# Patient Record
Sex: Female | Born: 2000 | Hispanic: Yes | Marital: Single | Smoking: Never smoker
Health system: Southern US, Community
[De-identification: ages and names within clinical notes are randomized; demographics above are authoritative.]

## PROBLEM LIST (undated history)

## (undated) ENCOUNTER — Inpatient Hospital Stay (HOSPITAL_COMMUNITY): Payer: Self-pay

## (undated) DIAGNOSIS — L732 Hidradenitis suppurativa: Secondary | ICD-10-CM

## (undated) DIAGNOSIS — R7303 Prediabetes: Secondary | ICD-10-CM

## (undated) DIAGNOSIS — J302 Other seasonal allergic rhinitis: Secondary | ICD-10-CM

## (undated) DIAGNOSIS — G629 Polyneuropathy, unspecified: Secondary | ICD-10-CM

## (undated) DIAGNOSIS — B9681 Helicobacter pylori [H. pylori] as the cause of diseases classified elsewhere: Secondary | ICD-10-CM

## (undated) DIAGNOSIS — K2970 Gastritis, unspecified, without bleeding: Secondary | ICD-10-CM

## (undated) DIAGNOSIS — G4730 Sleep apnea, unspecified: Secondary | ICD-10-CM

## (undated) DIAGNOSIS — K219 Gastro-esophageal reflux disease without esophagitis: Secondary | ICD-10-CM

## (undated) DIAGNOSIS — Z789 Other specified health status: Secondary | ICD-10-CM

## (undated) DIAGNOSIS — L700 Acne vulgaris: Secondary | ICD-10-CM

## (undated) HISTORY — DX: Acne vulgaris: L70.0

## (undated) HISTORY — PX: NO PAST SURGERIES: SHX2092

## (undated) HISTORY — DX: Helicobacter pylori (H. pylori) as the cause of diseases classified elsewhere: B96.81

## (undated) HISTORY — DX: Hidradenitis suppurativa: L73.2

## (undated) HISTORY — DX: Other specified health status: Z78.9

## (undated) HISTORY — DX: Gastro-esophageal reflux disease without esophagitis: K21.9

## (undated) HISTORY — DX: Helicobacter pylori (H. pylori) as the cause of diseases classified elsewhere: K29.70

## (undated) HISTORY — DX: Polyneuropathy, unspecified: G62.9

---
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
## 2019-01-02 ENCOUNTER — Ambulatory Visit (INDEPENDENT_AMBULATORY_CARE_PROVIDER_SITE_OTHER): Payer: Medicaid Other | Admitting: Family Medicine

## 2019-01-02 ENCOUNTER — Other Ambulatory Visit: Payer: Self-pay

## 2019-01-02 VITALS — BP 102/80 | HR 85 | Temp 98.4°F | Wt 290.0 lb

## 2019-01-02 DIAGNOSIS — G5602 Carpal tunnel syndrome, left upper limb: Secondary | ICD-10-CM | POA: Diagnosis not present

## 2019-01-02 DIAGNOSIS — R197 Diarrhea, unspecified: Secondary | ICD-10-CM | POA: Diagnosis not present

## 2019-01-02 MED ORDER — LOPERAMIDE HCL 2 MG PO TABS: 2.0000 mg | ORAL_TABLET | Freq: Four times a day (QID) | ORAL | 0 refills | Status: DC | PRN

## 2019-01-02 NOTE — Progress Notes (Signed)
   Subjective:    Lauren Obrien - 18 y.o. female MRN [id number]  Date of birth: January 03, 2001  CC:  Lauren Obrien is here for continued L arm numbness and diarrhea.  HPI: Continued L arm numbness and pain - lost her arm brace two days ago - wrist brace seemed to help, but feels like her hand feels very weak when she takes it off - pain radiates up to her shoulder -Continues to have numbness and tingling in her left hand  Diarrhea - has loose stools 2-3 times per day for 2 weeks - stools do not appear greasy - drinks oat milk each morning, and she worries that this is causing her diarrhea - has some cramping - does not occur at night - stools are loose but do not contain blood - does not think it is related to stress, although she does say that sometimes she does have GI symptoms in response to stress   Health Maintenance:  There are no preventive care reminders to display for this patient.  -  reports that she has never smoked. She has never used smokeless tobacco. - Review of Systems: Per HPI. - Past Medical History: Patient Active Problem List   Diagnosis Date Noted  . Carpal tunnel syndrome of left wrist 01/04/2019  . Diarrhea 01/04/2019  . Blurry vision, bilateral 12/16/2018  . Learning difficulty 12/16/2018  . Left wrist tendinitis 12/01/2018  . Sleep deprivation 09/28/2018  . Recurrent boils 09/21/2018  . Irregular menstrual bleeding 04/03/2018  . Acne vulgaris 04/03/2018  . Helicobacter pylori gastritis 03/05/2016  . Fatigue 01/21/2016  . GERD (gastroesophageal reflux disease) 01/21/2016  . Allergic rhinitis 09/23/2015   - Medications: reviewed and updated   Objective:   Physical Exam BP 102/80   Pulse 85   Temp 98.4 F (36.9 C) (Oral)   Wt 290 lb (131.5 kg)   SpO2 99%   BMI 48.26 kg/m  Gen: NAD, alert, cooperative with exam, well-appearing, appears comfortable CV: RRR, good S1/S2, no murmur Resp: CTABL, no wheezes, non-labored Abd: SNTND, BS  present, no guarding or organomegaly Musculoskeletal: Phalen's sign positive on left, no deformities visualized on left hand    Assessment & Plan:   Carpal tunnel syndrome of left wrist Although I have warned patient that carpal tunnel takes sometimes over a month to improve, patient to needs to be concerned about her left hand symptoms.  Unfortunately, since she has lost the brace, her symptoms may worsen.  Encouraged patient to buy a new brace, since we do not have another one to give her in our clinic.  Sent referral to sports medicine for further evaluation, possibly including an ultrasound or injections if needed.  Diarrhea Most likely functional given patient's high level of stress currently, although a full work-up has not yet been done.  Would like to start with patient using a food journal to document which foods make her diarrhea worse.  Also encouraged patient to avoid dairy for a few days to see if her diarrhea is affected and to try avoiding gluten for a few days as well.  Patient loperamide to help with her symptoms since they are affecting her while she is taking classes.  If food journal is noncontributory, can try stool ova and parasite exam next.    Lezlie Octave, M.D. 01/04/2019, 9:06 AM PGY-2, [facility]

## 2019-01-02 NOTE — Patient Instructions (Addendum)
It was nice seeing you today Lauren Obrien!  I have included a psychologist who will accept Medicaid below.  Please also take ibuprofen and Tylenol for your arm, and I have sent a referral to sports medicine.  You can also try to find a similar arm brace that is better than the first 1 that you got at the store.  For your diarrhea, I have sent in loperamide, which is also called Imodium.  Please take a journal recording your diet and seeing how it affects your diarrhea.  I hope that you feel better soon.  After you turn 18, you will still be able to get family-planning Medicaid, which will cover birth control including IUDs, so you do not need to get a new one before you turn 18.  If you have any questions or concerns, please feel free to call the clinic.   Be well,  Dr. Frances Furbish   Therapists  [facility]  [address] [place], [place]  [phone]  [facility]  [address] [place] [address] [place], [place]  [phone]  [facility]  [address]  [place], [place] [address] [place], [place] [address]  [phone]  [facility]  [address]  [place], [place] [address] [place], [place] [address]  [phone]

## 2019-01-04 DIAGNOSIS — G5602 Carpal tunnel syndrome, left upper limb: Secondary | ICD-10-CM | POA: Insufficient documentation

## 2019-01-04 DIAGNOSIS — R197 Diarrhea, unspecified: Secondary | ICD-10-CM | POA: Insufficient documentation

## 2019-01-04 NOTE — Assessment & Plan Note (Addendum)
Most likely functional given patient's high level of stress currently, although a full work-up has not yet been done.  Would like to start with patient using a food journal to document which foods make her diarrhea worse.  Also encouraged patient to avoid dairy for a few days to see if her diarrhea is affected and to try avoiding gluten for a few days as well.  Patient loperamide to help with her symptoms since they are affecting her while she is taking classes.  If food journal is noncontributory, can try stool ova and parasite exam next.

## 2019-01-04 NOTE — Assessment & Plan Note (Signed)
Although I have warned patient that carpal tunnel takes sometimes over a month to improve, patient to needs to be concerned about her left hand symptoms.  Unfortunately, since she has lost the brace, her symptoms may worsen.  Encouraged patient to buy a new brace, since we do not have another one to give her in our clinic.  Sent referral to sports medicine for further evaluation, possibly including an ultrasound or injections if needed.

## 2019-01-10 ENCOUNTER — Ambulatory Visit: Payer: Medicaid Other | Admitting: Sports Medicine

## 2019-01-16 DIAGNOSIS — H5213 Myopia, bilateral: Secondary | ICD-10-CM | POA: Diagnosis not present

## 2019-01-17 ENCOUNTER — Ambulatory Visit (INDEPENDENT_AMBULATORY_CARE_PROVIDER_SITE_OTHER): Payer: Medicaid Other | Admitting: Sports Medicine

## 2019-01-17 ENCOUNTER — Encounter: Payer: Self-pay | Admitting: Sports Medicine

## 2019-01-17 VITALS — BP 153/83 | Ht 65.0 in | Wt 290.0 lb

## 2019-01-17 DIAGNOSIS — G5602 Carpal tunnel syndrome, left upper limb: Secondary | ICD-10-CM | POA: Diagnosis not present

## 2019-01-17 MED ORDER — MELOXICAM 15 MG PO TABS: 15.0000 mg | ORAL_TABLET | Freq: Every day | ORAL | 1 refills | Status: DC

## 2019-01-17 NOTE — Progress Notes (Signed)
Lauren Obrien - 18 y.o. female MRN [id number]  Date of birth: 07-06-2001   Chief complaint: L hand numbness and tingling  SUBJECTIVE:    History of present illness: 18 year old female with a 42-month history of progressive numbness and tingling in her left hand.  She states that this was unprovoked.  She woke up after doing a prolonged session of typing and had numbness of her first second and third digits.  It felt like a burning sensation in her hand and fingers.  This initially was intermittent.  She was seen by her primary care with concern for possible carpal tunnel syndrome.  She was given a volar wrist splint and advised to take ibuprofen to help with her symptoms.  She states that ibuprofen did help in the wrist splint helped however she started to feel stiff from the splint so she discontinued it.  She has been out of the splint for approximately 3 weeks now and states her symptoms are worse.  She is starting to have persistent numbness of her first second and third digits.  It feels like a tingling sensation.  It sometimes bothers her at night.  It is worse with typing while she is studying for school.  Nothing really seems to relieve her symptoms.  She is never had anything like this before.  She does endorse that occasionally she feels like her lateral forearm also does have tingling.  Denies any neck pain or elbow pain.  Denies pregnancy.  She is wanting to definitively know today whether or not she has carpal tunnel syndrome.   Review of systems:  A complete review of systems was performed and pertinent positives and negatives discussed above in the HPI.   Past medical history: Allergic rhinitis, gastroesophageal reflux disease, fatigue Past surgical history: None Past family history: Diabetes and hypertension Social history: Non-smoker.  She works a full-time job and is also in school studying  Medications: Flonase, ibuprofen, MiraLAX, Pepcid, Zyrtec Allergies:  None  OBJECTIVE:  Physical exam: Vital signs are reviewed. BP (!) 153/83   Ht 5\' 5"  (1.651 m)   Wt 290 lb (131.5 kg)   BMI 48.26 kg/m   Gen.: Alert, oriented, appears stated age, in no apparent distress, interpreter is also present helping with Spanish to [place] interpretation Respiratory: Normal respirations, able to speak in full sentences Cardiac: Regular rate, distal pulses 2+ Integumentary: No rashes or skin lesions Neurologic:  Patient endorses decreased sensation over the palmar aspect of her first second and third digits at baseline.  Paresthesias worsen with dorsal carpal compression test.  Positive Tinel's test of the left wrist.  Negative Tinel's of the left elbow.  Negative Spurling's test of the neck. Musculoskeletal: Inspection of the left wrist demonstrates no acute abnormality.  She has mild radiocarpal tenderness to palpation without a palpable effusion.  She has hesitation to extension of the wrist however she has full active range of motion in wrist extension and flexion.  Strength testing is 5 out of 5 in wrist flexion and extension.  Her AIN/PIN/ulnar/radial nerves appear to be intact.  Diminished function of the median nerve as above.  Right radial pulses +2.  Capillary refill time less than 2 seconds.  Negative Finkelstein's test.  Positive Phalen's test.    ASSESSMENT & PLAN: Carpal tunnel syndrome of left wrist -Concern for carpal tunnel syndrome clinically versus extensor digitorum tendinitis -EMG study for definitive diagnosis for carpal tunnel syndrome -Dorsal night splint given today.  She previously had worn a splint  for over a month which did seem to help. -Meloxicam 15 mg daily.  Counseled on common and severe side effects of the medication. -Advised to stretch out her transverse carpal ligament as best she can and avoid repetitive activities like prolonged typing.  Adjust keyboard to flatten the wrist while typing. -Follow-up after her EMG studies to discuss  further intervention including ultrasound-guided carpal tunnel injection versus referral for surgical treatment versus physical therapy depending upon the results   Orders Placed This Encounter  Procedures  . Ambulatory referral to Neurology    Referral Priority:   Routine    Referral Type:   Consultation    Referral Reason:   Specialty Services Required    Requested Specialty:   Neurology    Number of Visits Requested:   1    Meds ordered this encounter  Medications  . meloxicam (MOBIC) 15 MG tablet    Sig: Take 1 tablet (15 mg total) by mouth daily.    Dispense:  30 tablet    Refill:  1      Lauren Messing, DO Sports Medicine Fellow   I was the preceptor for this visit and available for immediate consultation Lauren Aris, DO

## 2019-01-17 NOTE — Patient Instructions (Addendum)
Call to schedule a follow-up appointment with [place] after the EMG study are completed.

## 2019-01-17 NOTE — Assessment & Plan Note (Signed)
-  Concern for carpal tunnel syndrome clinically versus extensor digitorum tendinitis -EMG study for definitive diagnosis for carpal tunnel syndrome -Dorsal night splint given today.  She previously had worn a splint for over a month which did seem to help. -Meloxicam 15 mg daily.  Counseled on common and severe side effects of the medication. -Advised to stretch out her transverse carpal ligament as best she can and avoid repetitive activities like prolonged typing.  Adjust keyboard to flatten the wrist while typing. -Follow-up after her EMG studies to discuss further intervention including ultrasound-guided carpal tunnel injection versus referral for surgical treatment versus physical therapy depending upon the results

## 2019-01-22 ENCOUNTER — Encounter: Payer: Self-pay | Admitting: Family Medicine

## 2019-01-22 ENCOUNTER — Ambulatory Visit (INDEPENDENT_AMBULATORY_CARE_PROVIDER_SITE_OTHER): Payer: Medicaid Other | Admitting: Family Medicine

## 2019-01-22 ENCOUNTER — Other Ambulatory Visit: Payer: Self-pay

## 2019-01-22 VITALS — BP 124/80 | HR 97 | Temp 99.3°F | Wt 291.0 lb

## 2019-01-22 DIAGNOSIS — J029 Acute pharyngitis, unspecified: Secondary | ICD-10-CM | POA: Diagnosis not present

## 2019-01-22 DIAGNOSIS — R6889 Other general symptoms and signs: Secondary | ICD-10-CM

## 2019-01-22 LAB — POCT RAPID STREP A (OFFICE): Rapid Strep A Screen: NEGATIVE

## 2019-01-22 LAB — POCT INFLUENZA A/B
INFLUENZA A, POC: NEGATIVE
INFLUENZA B, POC: NEGATIVE

## 2019-01-22 MED ORDER — IBUPROFEN 600 MG PO TABS: 600.0000 mg | ORAL_TABLET | Freq: Three times a day (TID) | ORAL | 0 refills | Status: DC | PRN

## 2019-01-22 NOTE — Progress Notes (Signed)
Date of Visit: 01/22/2019   HPI:  Patient presents to discuss URI symptoms.  Symptoms began yesterday. Having sore throat, cough with phlegm production, body aches, nasal congestion. Eyes feel heavy. Felt warm but no specific fevers. No recent travel to areas with widespread COVID-19 or known contact with a COVID-19 infected person.    ROS: See HPI.  PMFSH: history of acne, allergic rhinitis, GERD, carpal tunnel syndrome  PHYSICAL EXAM: BP 124/80   Pulse 97   Temp 99.3 F (37.4 C) (Oral)   Wt 291 lb (132 kg)   SpO2 98%   BMI 48.42 kg/m  Gen: no acute distress, cooperative, pleasant HEENT: normocephalic, atraumatic, moist mucous membranes. Tympanic membranes bilaterally obscured by cerumen. Nares patent with some clear discharge. Oropharynx clear without exudates, mildly erythematous, mildly enlarged tonsils. No anterior cervical or supraclavicular lymphadenopathy.  Heart: regular rate and rhythm, no murmur Lungs: clear to auscultation bilaterally, normal work of breathing  Neuro: alert, grossly nonfocal, speech normal  ASSESSMENT/PLAN:  Viral syndrome Rapid strep negative, and clinically doubt strep given other viral symptoms (cough, nasal congestion, etc). Rapid flu test is also negative. No known exposures to COVID-19. Will treat with supportive care including fluids, tylenol, ibuprofen and have patient call for worsening symptoms and stay out of work/school until she is feeling better.  FOLLOW UP: Follow up as needed if symptoms worsen or do not improve.   [place] J. Pollie Meyer, MD [facility]

## 2019-01-22 NOTE — Patient Instructions (Signed)
Drink plenty of fluids Tylenol and ibuprofen as needed for pain For sore throat and cough can do teaspoon of honey Stay out of school/work till you feel better.  Be well, Dr. Pollie Meyer     Upper Respiratory Infection, Adult An upper respiratory infection (URI) affects the nose, throat, and upper air passages. URIs are caused by germs (viruses). The most common type of URI is often called "the common cold." Medicines cannot cure URIs, but you can do things at home to relieve your symptoms. URIs usually get better within 7-10 days. Follow these instructions at home: Activity  Rest as needed.  If you have a fever, stay home from work or school until your fever is gone, or until your doctor says you may return to work or school. ? You should stay home until you cannot spread the infection anymore (you are not contagious). ? Your doctor may have you wear a face mask so you have less risk of spreading the infection. Relieving symptoms  Gargle with a salt-water mixture 3-4 times a day or as needed. To make a salt-water mixture, completely dissolve -1 tsp of salt in 1 cup of warm water.  Use a cool-mist humidifier to add moisture to the air. This can help you breathe more easily. Eating and drinking   Drink enough fluid to keep your pee (urine) pale yellow.  Eat soups and other clear broths. General instructions   Take over-the-counter and prescription medicines only as told by your doctor. These include cold medicines, fever reducers, and cough suppressants.  Do not use any products that contain nicotine or tobacco. These include cigarettes and e-cigarettes. If you need help quitting, ask your doctor.  Avoid being where people are smoking (avoid secondhand smoke).  Make sure you get regular shots and get the flu shot every year.  Keep all follow-up visits as told by your doctor. This is important. How to avoid spreading infection to others   Wash your hands often with soap and  water. If you do not have soap and water, use hand sanitizer.  Avoid touching your mouth, face, eyes, or nose.  Cough or sneeze into a tissue or your sleeve or elbow. Do not cough or sneeze into your hand or into the air. Contact a doctor if:  You are getting worse, not better.  You have any of these: ? A fever. ? Chills. ? Brown or red mucus in your nose. ? Yellow or brown fluid (discharge)coming from your nose. ? Pain in your face, especially when you bend forward. ? Swollen neck glands. ? Pain with swallowing. ? White areas in the back of your throat. Get help right away if:  You have shortness of breath that gets worse.  You have very bad or constant: ? Headache. ? Ear pain. ? Pain in your forehead, behind your eyes, and over your cheekbones (sinus pain). ? Chest pain.  You have long-lasting (chronic) lung disease along with any of these: ? Wheezing. ? Long-lasting cough. ? Coughing up blood. ? A change in your usual mucus.  You have a stiff neck.  You have changes in your: ? Vision. ? Hearing. ? Thinking. ? Mood. Summary  An upper respiratory infection (URI) is caused by a germ called a virus. The most common type of URI is often called "the common cold."  URIs usually get better within 7-10 days.  Take over-the-counter and prescription medicines only as told by your doctor. This information is not intended to replace advice  given to you by your health care provider. Make sure you discuss any questions you have with your health care provider. Document Released: 04/19/2008 Document Revised: 06/24/2017 Document Reviewed: 06/24/2017 Elsevier Interactive Patient Education  2019 [employer].

## 2019-01-31 ENCOUNTER — Ambulatory Visit: Payer: Medicaid Other | Admitting: Family Medicine

## 2019-01-31 ENCOUNTER — Telehealth: Payer: Self-pay | Admitting: Family Medicine

## 2019-01-31 MED ORDER — MUPIROCIN 2 % EX OINT: 1.0000 "application " | TOPICAL_OINTMENT | Freq: Two times a day (BID) | CUTANEOUS | 0 refills | Status: DC

## 2019-01-31 NOTE — Telephone Encounter (Signed)
Called patient to assess ability to reschedule appointment. Patient coming to be seen for recurrent boils in genital area.  Previously has been evaluated for this at least twice before.  Has stopped shaving genital area as she thought shaving was irritating the area.  Previous boil popped with blood and pus but now has additional boil.  After shared decision making decided to try conservative measures with warm compresses regular hygiene and Bactroban ointment.  Patient will try this and call back for appointment later if this is not helping.  Bactroban sent to regular pharmacy.  Ellwood Dense, DO PGY-2, [facility] 01/31/2019 2:35 PM

## 2019-01-31 NOTE — Telephone Encounter (Signed)
Pt was returning call from Dr. Linwood Dibbles. I informed pt we had to cancel her appointment and to call back and schedule in a couple weeks.   Pt would like for someone to call her. She said she has a pimple on her vagina which is making it hard to walk and would like to know what she would have to do to be seen.

## 2019-01-31 NOTE — Telephone Encounter (Signed)
Spoke with patient.  See separate telephone encounter.

## 2019-02-13 ENCOUNTER — Telehealth: Payer: Self-pay | Admitting: Family Medicine

## 2019-02-13 ENCOUNTER — Telehealth: Payer: Medicaid Other

## 2019-02-13 ENCOUNTER — Other Ambulatory Visit: Payer: Self-pay

## 2019-02-13 ENCOUNTER — Other Ambulatory Visit: Payer: Self-pay | Admitting: Family Medicine

## 2019-02-13 NOTE — Telephone Encounter (Signed)
Call patient for telemedicine encounter she was scheduled today.  Patient states that she cannot remember why she was scheduled and does not have any concerns at this time.  She does state that she would like a refill on her ibuprofen, but per chart review it appears that Dr. Frances Furbish refilled that today.  Patient declines an encounter today.

## 2019-02-21 ENCOUNTER — Encounter: Payer: Medicaid Other | Admitting: Neurology

## 2019-02-27 ENCOUNTER — Telehealth (INDEPENDENT_AMBULATORY_CARE_PROVIDER_SITE_OTHER): Payer: Medicaid Other | Admitting: Family Medicine

## 2019-02-27 ENCOUNTER — Other Ambulatory Visit: Payer: Self-pay

## 2019-02-27 ENCOUNTER — Telehealth: Payer: Self-pay | Admitting: Family Medicine

## 2019-02-27 DIAGNOSIS — J302 Other seasonal allergic rhinitis: Secondary | ICD-10-CM

## 2019-02-27 MED ORDER — FLUTICASONE PROPIONATE 50 MCG/ACT NA SUSP: 2.0000 | Freq: Every day | NASAL | 6 refills | Status: DC

## 2019-02-27 NOTE — Telephone Encounter (Signed)
Called patient for telehealth. No answer. LVM to call back.

## 2019-02-27 NOTE — Progress Notes (Addendum)
[facility] Telemedicine Visit  Patient consented to have virtual visit. Method of visit: Telephone  Encounter participants: Patient: Lauren Obrien - located at home Provider: Garnette Gunner - located at office Others (if applicable): None  Chief Complaint: Seasonal allergies, recurrent boils  HPI:  ALLERGIES Has had symptom for 14 days. Seasonal symptoms: sneezing, nose itches, HA Nasal discharge: yes Medications tried: none  Symptoms Sneezing: Y Scratchy throat: N Fever: N Headache or face pain: Y Tooth pain: N Sore throat or swollen glands: N Severe fatigue: N Stiff neck: N Shortness of breath: N Rash: N  Patient has history of recurrent boils in inguinal and buttocks regions. She has a long history of these. She is wanting some treatment for these. She is afebrile. I informed her that given that they have been resistant to treatment before, she should come in to be evaluated. Will set up ATC apt.   ROS see HPI Smoking Status noted  ROS: per HPI  Pertinent PMHx: non-contributory  Exam:  Respiratory: cough, sneeze  Assessment/Plan: Problem List Items Addressed This Visit    None    Visit Diagnoses    Seasonal allergies    -  Primary   Relevant Medications   fluticasone (FLONASE) 50 MCG/ACT nasal spray   Allergic state, initial encounter        Will trial intranasal inhaler for seasonal allergies.   Will schedule ATC apt for recurrent inguinal lesions.   Time spent during visit with patient: 15 min minutes

## 2019-03-02 ENCOUNTER — Other Ambulatory Visit: Payer: Self-pay

## 2019-03-02 ENCOUNTER — Ambulatory Visit (INDEPENDENT_AMBULATORY_CARE_PROVIDER_SITE_OTHER): Payer: Medicaid Other | Admitting: Family Medicine

## 2019-03-02 ENCOUNTER — Other Ambulatory Visit (HOSPITAL_COMMUNITY)
Admission: RE | Admit: 2019-03-02 | Discharge: 2019-03-02 | Disposition: A | Discharge status: Home / Self Care | Payer: Medicaid Other | Source: Ambulatory Visit | Attending: Family Medicine | Admitting: Family Medicine

## 2019-03-02 VITALS — BP 104/64 | HR 98 | Temp 98.0°F | Resp 98 | Wt 293.0 lb

## 2019-03-02 DIAGNOSIS — L732 Hidradenitis suppurativa: Secondary | ICD-10-CM | POA: Diagnosis not present

## 2019-03-02 DIAGNOSIS — R300 Dysuria: Secondary | ICD-10-CM

## 2019-03-02 DIAGNOSIS — N898 Other specified noninflammatory disorders of vagina: Secondary | ICD-10-CM | POA: Diagnosis not present

## 2019-03-02 LAB — POCT WET PREP (WET MOUNT)
Clue Cells Wet Prep Whiff POC: NEGATIVE
Trichomonas Wet Prep HPF POC: ABSENT

## 2019-03-02 LAB — POCT URINALYSIS DIP (MANUAL ENTRY)
Bilirubin, UA: NEGATIVE
Blood, UA: NEGATIVE
Glucose, UA: NEGATIVE mg/dL
Ketones, POC UA: NEGATIVE mg/dL
Leukocytes, UA: NEGATIVE
Nitrite, UA: NEGATIVE
Protein Ur, POC: NEGATIVE mg/dL
Spec Grav, UA: 1.025 (ref 1.010–1.025)
Urobilinogen, UA: 0.2 E.U./dL
pH, UA: 6 (ref 5.0–8.0)

## 2019-03-02 MED ORDER — CLINDAMYCIN PHOSPHATE 1 % EX LOTN: TOPICAL_LOTION | Freq: Two times a day (BID) | CUTANEOUS | 0 refills | Status: DC

## 2019-03-02 NOTE — Progress Notes (Signed)
Subjective:  Patient ID: Lauren Obrien  DOB: [birth year] MRN: [id number]  Lauren Obrien is a 18 y.o. female with a PMH of allergies, GERD, AEB, acne, carpal tunnel, here today for vaginal discharge and body pimples.   HPI:  Vaginal Discharge - has been ongoing for few days  - Denies itching, burning, abdominal pain, nausea or vomiting - Discharge described as thin, white.  - Patient reports no STD in the past.  -Patient reports that she is not currently sexually active however she is sexually active with men only. - IUD in place.  - Reports burning with urination, no hematuria.  - Patient reports has never had BV in the past or yeast.  - Patient denies douching.   Body Pimples: -Patient reports that after having her child she developed acne on her face and over her body.  She has not been concerned however over the past few months that she has had worsening pimples that have been painful in her pelvic area.  Patient was told by another outside provider that she is to avoid shaving the area twice in a row in order to prevent these pimples.  Patient states that she tried not shaving but she was still having the occurrence. -States that she has been placing warm compresses over the areas and they will resolve.  She states that they are slightly painful. -Denies any fevers, chills, burning or tingling of the area.  ROS: All other systems otherwise negative, except as mentioned in HPI  Social hx: Denies use of illicit drugs, alcohol use Smoking status reviewed-reports never having smoked  Patient Active Problem List   Diagnosis Date Noted  . Vaginal discharge 03/04/2019  . Hidradenitis suppurativa 03/04/2019  . Carpal tunnel syndrome of left wrist 01/04/2019  . Diarrhea 01/04/2019  . Blurry vision, bilateral 12/16/2018  . Learning difficulty 12/16/2018  . Left wrist tendinitis 12/01/2018  . Sleep deprivation 09/28/2018  . Recurrent boils 09/21/2018  . Irregular menstrual  bleeding 04/03/2018  . Acne vulgaris 04/03/2018  . Helicobacter pylori gastritis 03/05/2016  . Fatigue 01/21/2016  . GERD (gastroesophageal reflux disease) 01/21/2016  . Allergic rhinitis 09/23/2015     Objective:  BP 104/64   Pulse 98   Temp 98 F (36.7 C) (Oral)   Resp (!) 98   Wt 293 lb (132.9 kg)   BMI 48.76 kg/m   Vitals and nursing note reviewed  General: NAD, pleasant Pulm: normal effort GI: soft, nontender, nondistended GU/GYN: External genitalia within normal limits.  Multiple boils noted in pelvic area consistent with hidradenitis supporitiva.  Vaginal mucosa pink, moist, normal rugae.  Nonfriable cervix without lesions, no discharge or bleeding noted on speculum exam.  Bimanual exam revealed normal, nongravid uterus.  No cervical motion tenderness. Exam performed in the presence of a chaperone. Extremities: no edema or cyanosis. WWP. Skin: warm and dry, no rashes noted Neuro: alert and oriented, no focal deficits Psych: normal affect, normal thought content  Assessment & Plan:   Vaginal discharge Patient also reporting some dysuria.  UA normal in office. Wet prep with no signs of infection Gonorrhea and chlamydia pending   Hidradenitis suppurativa Exam consistent with hidradenitis suppurativa.  Patient counseled on weight loss in order to prevent further recurrence.  Advised to use warm compresses.  Patient also given clindamycin cream in order to use twice daily to help prevent.  Areas with no fluctuant and with minimal pain on exam so no need for surgical consult at this time.   [place]  Talbert Forest DO Family Medicine Resident PGY-2

## 2019-03-02 NOTE — Patient Instructions (Addendum)
Thank you for coming to see me today. It was a pleasure! Today we talked about:   Your vaginal discharge.  We will call you with your lab results.  For the bumps on your groin you may use warm compresses as needed.  I have also sent an antibiotic cream that you may use on the individual areas in order to help.  Please follow-up as needed.  If you have any questions or concerns, please do not hesitate to call the office at [phone].  Take Care,   [place] Brindy Higginbotham, DO  Hidradenitis Suppurativa Hidradenitis suppurativa is a long-term (chronic) skin disease. It is similar to a severe form of acne, but it affects areas of the body where acne would be unusual, especially areas of the body where skin rubs against skin and becomes moist. These include:  Underarms.  Groin.  Genital area.  Buttocks.  Upper thighs.  Breasts. Hidradenitis suppurativa may start out as small lumps or pimples caused by blocked sweat glands or hair follicles. Pimples may develop into deep sores that break open (rupture) and drain pus. Over time, affected areas of skin may thicken and become scarred. This condition is rare and does not spread from person to person (non-contagious). What are the causes? The exact cause of this condition is not known. It may be related to:  Female and female hormones.  An overactive disease-fighting system (immune system). The immune system may over-react to blocked hair follicles or sweat glands and cause swelling and pus-filled sores. What increases the risk? You are more likely to develop this condition if you:  Are female.  Are 2911-19 years old.  Have a family history of hidradenitis suppurativa.  Have a personal history of acne.  Are overweight.  Smoke.  Take the medicine lithium. What are the signs or symptoms? The first symptoms are usually painful bumps in the skin, similar to pimples. The condition may get worse over time (progress), or it may only cause  mild symptoms. If the disease progresses, symptoms may include:  Skin bumps getting bigger and growing deeper into the skin.  Bumps rupturing and draining pus.  Itchy, infected skin.  Skin getting thicker and scarred.  Tunnels under the skin (fistulas) where pus drains from a bump.  Pain during daily activities, such as pain during walking if your groin area is affected.  Emotional problems, such as stress or depression. This condition may affect your appearance and your ability or willingness to wear certain clothes or do certain activities. How is this diagnosed? This condition is diagnosed by a health care provider who specializes in skin diseases (dermatologist). You may be diagnosed based on:  Your symptoms and medical history.  A physical exam.  Testing a pus sample for infection.  Blood tests. How is this treated? Your treatment will depend on how severe your symptoms are. The same treatment will not work for everybody with this condition. You may need to try several treatments to find what works best for you. Treatment may include:  Cleaning and bandaging (dressing) your wounds as needed.  Lifestyle changes, such as new skin care routines.  Taking medicines, such as: ? Antibiotics. ? Acne medicines. ? Medicines to reduce the activity of the immune system. ? A diabetes medicine (metformin). ? Birth control pills, for women. ? Steroids to reduce swelling and pain.  Working with a mental health care provider, if you experience emotional distress due to this condition. If you have severe symptoms that do not get  better with medicine, you may need surgery. Surgery may involve:  Using a laser to clear the skin and remove hair follicles.  Opening and draining deep sores.  Removing the areas of skin that are diseased and scarred. Follow these instructions at home: Medicines   Take over-the-counter and prescription medicines only as told by your health care  provider.  If you were prescribed an antibiotic medicine, take it as told by your health care provider. Do not stop taking the antibiotic even if your condition improves. Skin care  If you have open wounds, cover them with a clean dressing as told by your health care provider. Keep wounds clean by washing them gently with soap and water when you bathe.  Do not shave the areas where you get hidradenitis suppurativa.  Do not wear deodorant.  Wear loose-fitting clothes.  Try to avoid getting overheated or sweaty. If you get sweaty or wet, change into clean, dry clothes as soon as you can.  To help relieve pain and itchiness, cover sore areas with a warm, clean washcloth (warm compress) for 5-10 minutes as often as needed.  If told by your health care provider, take a bleach bath twice a week: ? Fill your bathtub halfway with water. ? Pour in  cup of unscented household bleach. ? Soak in the tub for 5-10 minutes. ? Only soak from the neck down. Avoid water on your face and hair. ? Shower to rinse off the bleach from your skin. General instructions  Learn as much as you can about your disease so that you have an active role in your treatment. Work closely with your health care provider to find treatments that work for you.  If you are overweight, work with your health care provider to lose weight as recommended.  Do not use any products that contain nicotine or tobacco, such as cigarettes and e-cigarettes. If you need help quitting, ask your health care provider.  If you struggle with living with this condition, talk with your health care provider or work with a mental health care provider as recommended.  Keep all follow-up visits as told by your health care provider. This is important. Where to find more information  Hidradenitis Suppurativa Foundation, Inc.: https://www.hs-foundation.org/ Contact a health care provider if you have:  A flare-up of hidradenitis suppurativa.  A  fever or chills.  Trouble controlling your symptoms at home.  Trouble doing your daily activities because of your symptoms.  Trouble dealing with emotional problems related to your condition. Summary  Hidradenitis suppurativa is a long-term (chronic) skin disease. It is similar to a severe form of acne, but it affects areas of the body where acne would be unusual.  The first symptoms are usually painful bumps in the skin, similar to pimples. The condition may get worse over time (progress), or it may only cause mild symptoms.  If you have open wounds, cover them with a clean dressing as told by your health care provider. Keep wounds clean by washing them gently with soap and water when you bathe.  Besides skin care, treatment may include medicines, laser treatment, and surgery. This information is not intended to replace advice given to you by your health care provider. Make sure you discuss any questions you have with your health care provider. Document Released: 06/15/2004 Document Revised: 11/09/2017 Document Reviewed: 11/09/2017 Elsevier Interactive Patient Education  2019 [employer].

## 2019-03-04 DIAGNOSIS — N898 Other specified noninflammatory disorders of vagina: Secondary | ICD-10-CM | POA: Insufficient documentation

## 2019-03-04 DIAGNOSIS — L732 Hidradenitis suppurativa: Secondary | ICD-10-CM | POA: Insufficient documentation

## 2019-03-04 NOTE — Assessment & Plan Note (Signed)
Exam consistent with hidradenitis suppurativa.  Patient counseled on weight loss in order to prevent further recurrence.  Advised to use warm compresses.  Patient also given clindamycin cream in order to use twice daily to help prevent.  Areas with no fluctuant and with minimal pain on exam so no need for surgical consult at this time.

## 2019-03-04 NOTE — Assessment & Plan Note (Signed)
Patient also reporting some dysuria.  UA normal in office. Wet prep with no signs of infection Gonorrhea and chlamydia pending

## 2019-03-05 ENCOUNTER — Other Ambulatory Visit: Payer: Self-pay | Admitting: Family Medicine

## 2019-03-05 ENCOUNTER — Telehealth: Payer: Self-pay | Admitting: *Deleted

## 2019-03-05 MED ORDER — CLINDAMYCIN PHOS-BENZOYL PEROX 1-5 % EX GEL: Freq: Two times a day (BID) | CUTANEOUS | 1 refills | Status: DC

## 2019-03-05 NOTE — Telephone Encounter (Signed)
Received fax from [facility] pharmacy requesting prior authorization of CleocinT lotion .  Please see formulary below and let me know if you would like to pursue a PA  Jone Baseman, CMA

## 2019-03-05 NOTE — Telephone Encounter (Signed)
Sent in benzaclin which is preferred

## 2019-03-06 LAB — CERVICOVAGINAL ANCILLARY ONLY
Chlamydia: NEGATIVE
Neisseria Gonorrhea: NEGATIVE

## 2019-03-08 ENCOUNTER — Ambulatory Visit: Payer: Medicaid Other | Admitting: Sports Medicine

## 2019-03-08 ENCOUNTER — Other Ambulatory Visit: Payer: Self-pay

## 2019-03-08 ENCOUNTER — Encounter: Payer: Self-pay | Admitting: Sports Medicine

## 2019-03-08 VITALS — Ht 65.0 in | Wt 293.0 lb

## 2019-03-08 DIAGNOSIS — G5602 Carpal tunnel syndrome, left upper limb: Secondary | ICD-10-CM

## 2019-03-08 NOTE — Progress Notes (Signed)
  Lauren Obrien - 18 y.o. female MRN [id number]  Date of birth: 10-31-2001  Telemedicine Visit   Patient consented to have virtual visit. Pt verified by name and DOB. Method of visit: Video visit   Encounter participants: Patient: Lauren Obrien - located at home Provider: Dalbert Garnet, DO - located at [facility] Supervising physician available for consultation: Dr. Frazier Butt, DO   SUBJECTIVE:      Chief Complaint: numbness/tingling in the bilateral hands  HPI:  18 y/o female with constant numbness/tingling in the hands bilaterally.  She was seen for this approximately 6 months ago and was dx with carpal tunnel syndrome.  At that time she was referred to neurology for EMG/NCS.  She did not have this study performed. Today she reports 0/10 pain but has continued numbness and tingling in the hands bilaterally. Wears braces at night.  She also describes some pain around the shoulder.  Additionally, she reports cracking her wrist/hand and getting relief from this.  She is unable to explain which fingers or portions of her arm feel numb.   ROS:     See HPI. All other reviewed systems negative.  PERTINENT  PMH / PSH FH / / SH:  Past Medical, Surgical, Social, and Family History Reviewed & Updated in the EMR.    OBJECTIVE: There were no vitals taken for this visit.  Physical Exam:  Vital signs are reviewed.  Telemedicine visit  ASSESSMENT & PLAN:  1.  Patient initially scheduled for telemedicine visit for follow-up for carpal tunnel syndrome.  Although she does describe having numbness and tingling, the extent of her symptoms sound to be more involved than carpal tunnel syndrome. -She will be scheduled for follow-up for an in person appointment    I was the preceptor for this visit and available for immediate consultation Marsa Aris, DO

## 2019-03-09 NOTE — Progress Notes (Signed)
Spoke with pt. She stated that she is still having vag. Itching. Made a telemedicine appt for 03/12/2019 at 1:30p. Aquilla Solian, CMA

## 2019-03-12 ENCOUNTER — Other Ambulatory Visit: Payer: Self-pay

## 2019-03-12 ENCOUNTER — Telehealth (INDEPENDENT_AMBULATORY_CARE_PROVIDER_SITE_OTHER): Payer: Medicaid Other | Admitting: Family Medicine

## 2019-03-12 DIAGNOSIS — N760 Acute vaginitis: Secondary | ICD-10-CM

## 2019-03-12 MED ORDER — NYSTATIN-TRIAMCINOLONE 100000-0.1 UNIT/GM-% EX OINT: 1.0000 "application " | TOPICAL_OINTMENT | Freq: Two times a day (BID) | CUTANEOUS | 0 refills | Status: DC

## 2019-03-12 NOTE — Progress Notes (Signed)
  [facility] Telemedicine Visit  Patient consented to have virtual visit. Method of visit: Telephone  Encounter participants: Patient: Lauren Obrien - located at home Provider: Tillman Sers - located at [facility] office Others (if applicable): none  Chief Complaint: vaginal itching  HPI:  Patient was seen in office 4/17 for vaginal complaint, she had normal UA and normal wet prep at that time. She did not hear back about the other test (gc/chlamydia) and I informed her it was negative. She states that since that visit she has had more and more vaginal itching which is very uncomfortable for her. She states there is some redness around her vagina especially on the labia. She has not seen a rash or any bumps or lesions. No vaginal discharge. No pain with urination. No fevers.   ROS: per HPI  Pertinent PMHx: hx of yeast vaginitis noted 4 years ago  Exam:  Respiratory: speaks in full sentences, no respiratory distress  Assessment/Plan:  1. Vaginitis and vulvovaginitis Although wet prep 4/17 negative for yeast, patient experiencing vaginal itching and pain consistent with vaginitis, likely from yeast, will treat w/ 7 days of triamcinolone-nystatin cream for relief of itching/pain. Discussed with patient limitations of diagnosis given this is a telephone call, she agrees to call if the treatment of cream BID does not resolve symptoms in 7 days. At that time if she still has irritation would have her come in again for in person evaluation.   Time spent during visit with patient: 7 minutes  Dolores Patty, DO PGY-3, [facility] 03/12/2019 1:54 PM

## 2019-03-13 ENCOUNTER — Ambulatory Visit: Payer: Medicaid Other | Admitting: Sports Medicine

## 2019-03-15 ENCOUNTER — Ambulatory Visit: Payer: Medicaid Other | Admitting: Sports Medicine

## 2019-03-15 ENCOUNTER — Telehealth: Payer: Self-pay | Admitting: *Deleted

## 2019-03-15 MED ORDER — CLOTRIMAZOLE-BETAMETHASONE 1-0.05 % EX CREA: 1.0000 "application " | TOPICAL_CREAM | Freq: Two times a day (BID) | CUTANEOUS | 0 refills | Status: DC

## 2019-03-15 NOTE — Telephone Encounter (Signed)
Received fax from pharmacy requesting prior authorization of nystatin-triamcinolone ointment.  See formulary below.  Let me know if you want to pursue a PA. Jone Baseman, CMA

## 2019-03-15 NOTE — Telephone Encounter (Signed)
rx for lotrisone sent in to pharmacy in place of [facility]

## 2019-03-22 ENCOUNTER — Ambulatory Visit: Payer: Medicaid Other | Admitting: Sports Medicine

## 2019-03-26 ENCOUNTER — Ambulatory Visit: Payer: Medicaid Other | Admitting: Sports Medicine

## 2019-04-13 ENCOUNTER — Encounter: Payer: Self-pay | Admitting: Family Medicine

## 2019-04-13 ENCOUNTER — Telehealth (INDEPENDENT_AMBULATORY_CARE_PROVIDER_SITE_OTHER): Payer: Medicaid Other | Admitting: Student in an Organized Health Care Education/Training Program

## 2019-04-13 ENCOUNTER — Ambulatory Visit (INDEPENDENT_AMBULATORY_CARE_PROVIDER_SITE_OTHER): Payer: Medicaid Other | Admitting: Family Medicine

## 2019-04-13 ENCOUNTER — Other Ambulatory Visit: Payer: Self-pay

## 2019-04-13 DIAGNOSIS — L732 Hidradenitis suppurativa: Secondary | ICD-10-CM

## 2019-04-13 MED ORDER — DOXYCYCLINE HYCLATE 100 MG PO CAPS: 100.0000 mg | ORAL_CAPSULE | Freq: Every day | ORAL | 0 refills | Status: AC

## 2019-04-13 NOTE — Progress Notes (Signed)
[facility] Telemedicine Visit  Patient consented to have virtual visit. Method of visit: Video was attempted, but technology challenges prevented patient from using video, so visit was conducted via telephone.  Encounter participants: Patient: Lauren Obrien TAVWPV - located at home Provider: Leeroy Bock - located at home office  Chief Complaint: worsening vaginal "pimples" and itching  HPI:  Patient states that she has a frequent history of "pimples" on her vagina which was previously improved with the topical antibiotic given by her doctor. She continues to use the cream but her lesions have gotten worse now. She describes them as larger, more in number, and more painful. She has also had 2-3 days of worsening pruritus in her vagina which has prevented her from sleeping. She has not tried anything to treat at home. She denies dysuria or discharge.  ROS: per HPI  Pertinent PMHx: patient seen 4/17 for similar presentation and had negative UA, wet prep and GC/chlamydia. Exam positive for HA.   Exam:  Patient sounds mildly anxious.  Assessment/Plan:  Hidradenitis suppurativa - worsening of status despite topical antibiotic tx - referred patient for in-person appointment for exam. - based off exam, would consider systemic antibiotic course or surgical consult     Time spent during visit with patient: 7 minutes

## 2019-04-13 NOTE — Patient Instructions (Addendum)
Thank you for coming to see me today. It was a pleasure! Today we talked about:   Lets try 12 weeks of doxycycline for your hidradenitis suppurativa. I'm glad you have an IUD because you should not get pregnant on this medicine.  You may try Darene Lamer for your hair.   Please follow-up with your primary care provider in 12 weeks or sooner as needed.  If you have any questions or concerns, please do not hesitate to call the office at [phone].  Take Care,   [place] Remonia Otte, DO  Doxycycline oral tablets (Periodontitis) What is this medicine? DOXYCYCLINE (dox i SYE kleen) is a tetracycline antibiotic. It kills certain bacteria or stops their growth. This medicine is used to treat a dental infection called periodontitis. This medicine may be used for other purposes; ask your health care provider or pharmacist if you have questions. COMMON BRAND NAME(S): Oraxyl, Periostat What should I tell my health care provider before I take this medicine? They need to know if you have any of these conditions: -liver disease -long exposure to sunlight like working outdoors -stomach problems like colitis -an unusual or allergic reaction to doxycycline, tetracycline antibiotics, other medicines, foods, dyes, or preservatives -pregnant or trying to get pregnant -breast-feeding How should I use this medicine? Take this medicine by mouth with a full glass of water. Follow the directions on the prescription label. Take this medicine at least 1 hour before or 2 hours after food. Take your medicine at regular intervals. Do not take your medicine more often than directed. Take all of your medicine as directed even if you think you are better. Do not skip doses or stop your medicine early. Talk to your pediatrician regarding the use of this medicine in children. Special care may be needed. Overdosage: If you think you have taken too much of this medicine contact a poison control center or emergency room at  once. NOTE: This medicine is only for you. Do not share this medicine with others. What if I miss a dose? If you miss a dose, take it as soon as you can. If it is almost time for your next dose, take only that dose. Do not take double or extra doses. What may interact with this medicine? -antacids -barbiturates -birth control pills -bismuth subsalicylate -carbamazepine -methoxyflurane -other antibiotics -phenytoin -vitamins that contain iron -warfarin This list may not describe all possible interactions. Give your health care provider a list of all the medicines, herbs, non-prescription drugs, or dietary supplements you use. Also tell them if you smoke, drink alcohol, or use illegal drugs. Some items may interact with your medicine. What should I watch for while using this medicine? Tell your doctor or health care professional if your symptoms do not improve. Do not treat diarrhea with over the counter products. Contact your doctor if you have diarrhea that lasts more than 2 days or if it is severe and watery. Do not take this medicine just before going to bed. It may not dissolve properly when you lay down and can cause pain in your throat. Drink plenty of fluids while taking this medicine to also help reduce irritation in your throat. This medicine can make you more sensitive to the sun. Keep out of the sun. If you cannot avoid being in the sun, wear protective clothing and use sunscreen. Do not use sun lamps or tanning beds/booths. Birth control pills may not work properly while you are taking this medicine. Talk to your doctor about using an  extra method of birth control. If you are being treated for a sexually transmitted infection, avoid sexual contact until you have finished your treatment. Your sexual partner may also need treatment. Avoid antacids, aluminum, calcium, magnesium, and iron products for 4 hours before and 2 hours after taking a dose of this medicine. What side effects  may I notice from receiving this medicine? Side effects that you should report to your doctor or health care professional as soon as possible: -allergic reactions like skin rash, itching or hives, swelling of the face, lips, or tongue -difficulty breathing -fever -itching in the rectal or genital area -pain on swallowing -redness, blistering, peeling or loosening of the skin, including inside the mouth -severe stomach pain or cramps -unusual bleeding or bruising -unusually weak or tired -yellowing of the eyes or skin Side effects that usually do not require medical attention (report to your doctor or health care professional if they continue or are bothersome): -diarrhea -loss of appetite -nausea, vomiting This list may not describe all possible side effects. Call your doctor for medical advice about side effects. You may report side effects to FDA at 1-800-FDA-1088. Where should I keep my medicine? Keep out of the reach of children. Store at room temperature between 15 and 30 degrees C (59 and 86 degrees F). Protect from light. Keep container tightly closed. Throw away any unused medicine after the expiration date. Taking this medicine after the expiration date can make you seriously ill. NOTE: This sheet is a summary. It may not cover all possible information. If you have questions about this medicine, talk to your doctor, pharmacist, or health care provider.  2019 Elsevier/Gold Standard (2008-02-29 14:50:34)

## 2019-04-13 NOTE — Assessment & Plan Note (Addendum)
-   worsening of status despite topical antibiotic tx - referred patient for in-person appointment for exam. - based off exam, would consider systemic antibiotic course or surgical consult

## 2019-04-13 NOTE — Progress Notes (Signed)
  Subjective:  Patient ID: Lauren Obrien  DOB: December 11, 2000 MRN: [id number]  Eliotte Middendorf is a 18 y.o. female with a PMH of allergic rhinitis, GERD, acne, hidradenitis suppurative, here today for boils/pimples.   HPI:  Hidradenitis Suppurative: - Patient reports that the cream has helped her with her pimples in her groin. She states that they however have not gone away completely. She is also distraught with the fact that's he has pimples all over her body.  -She is interested in some other medication or stringer cream for her groin -denies any drainage from areas, no fevers, chills, abdominal pain, vaginal discharge  ROS: as mentioned in HPI  Social hx: Denies use of illicit drugs, alcohol use Smoking status reviewed-denies smoking  Patient Active Problem List   Diagnosis Date Noted  . Hidradenitis suppurativa 03/04/2019  . Carpal tunnel syndrome of left wrist 01/04/2019  . Blurry vision, bilateral 12/16/2018  . Learning difficulty 12/16/2018  . Left wrist tendinitis 12/01/2018  . Sleep deprivation 09/28/2018  . Recurrent boils 09/21/2018  . Irregular menstrual bleeding 04/03/2018  . Acne vulgaris 04/03/2018  . Helicobacter pylori gastritis 03/05/2016  . GERD (gastroesophageal reflux disease) 01/21/2016  . Allergic rhinitis 09/23/2015     Objective:  BP 105/80 (BP Location: Left Arm, Patient Position: Sitting, Cuff Size: Large)   Pulse 82   SpO2 99%   Vitals and nursing note reviewed  General: NAD, pleasant Pulm: normal effort GI: soft, nontender, nondistended Extremities: no edema or cyanosis. WWP. Skin: warm and dry, small papules and pustules through groin area with no fluctuance or surrounding erythema  Neuro: alert and oriented, no focal deficits Psych: normal affect, normal thought content  Assessment & Plan:   Hidradenitis suppurativa Exam consistent with mild hidradenitis suppurativa. Discussed options for treatment ad patient is aggravated by these  areas ad wishes to seek further treatment. Areas with no fluctuant and with minimal pain on exam so no need for surgical consult at this time, and would likely not resolve issue -will started patient on 12 weeks of doxycycline therapy which may also help with her acne- she has IUD in place and states she is not currently sexually active -patient with no signs of hirsutism, but may consider low-dose spironolactone -Patient again counseled on weight loss in order to prevent further recurrence. -Advised to use warm compresses.   -return precautions discussed    [place] Graylyn Bunney, DO Family Medicine Resident PGY-2

## 2019-04-16 ENCOUNTER — Other Ambulatory Visit: Payer: Self-pay

## 2019-04-16 ENCOUNTER — Ambulatory Visit (INDEPENDENT_AMBULATORY_CARE_PROVIDER_SITE_OTHER): Payer: Medicaid Other | Admitting: Sports Medicine

## 2019-04-16 ENCOUNTER — Encounter: Payer: Self-pay | Admitting: Sports Medicine

## 2019-04-16 VITALS — BP 143/79 | Ht 66.0 in | Wt 290.0 lb

## 2019-04-16 DIAGNOSIS — G5693 Unspecified mononeuropathy of bilateral upper limbs: Secondary | ICD-10-CM | POA: Diagnosis not present

## 2019-04-16 NOTE — Assessment & Plan Note (Addendum)
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  Notably, Phalen and Tinel signs were negative today.  An EMG may not be currently indicated since the diagnosis of carpal tunnel is unclear.  Will refer to neurology for further evaluation given her persistent neuropathic symptoms.

## 2019-04-16 NOTE — Progress Notes (Addendum)
   Subjective:    Lauren Obrien - 18 y.o. female MRN [id number]  Date of birth: February 09, 2001  CC:  Lauren Obrien is here for bilateral upper extremity numbness.  HPI: Patient reports that she has had left upper extremity numbness and tingling for a few months.  She has worn a wrist brace every night for the past couple months without any improvement of her symptoms, although they have not gotten worse either.  She was taking college classes until early May that involved a lot of typing, but even once those ended, her numbness and tingling persisted.  She will sometimes feel numbness and tingling radiate up to her left shoulder as well.  Then, about 1 week ago, she developed some pain in her right wrist as well as numbness and tingling of that hand.  She had initially been scheduled to do an EMG but canceled that appointment when she was feeling better.  However, since then, her symptoms have returned.  Health Maintenance:  There are no preventive care reminders to display for this patient.  -  reports that she has never smoked. She has never used smokeless tobacco. - Review of Systems: Per HPI. - Past Medical History: Patient Active Problem List   Diagnosis Date Noted  . 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 Hidradenitis suppurativa 03/04/2019  . Carpal tunnel syndrome of left wrist 01/04/2019  . Blurry vision, bilateral 12/16/2018  . Learning difficulty 12/16/2018  . Left wrist tendinitis 12/01/2018  . Sleep deprivation 09/28/2018  . Recurrent boils 09/21/2018  . Irregular menstrual bleeding 04/03/2018  . Acne vulgaris 04/03/2018  . Helicobacter pylori gastritis 03/05/2016  . GERD (gastroesophageal reflux disease) 01/21/2016  . Allergic rhinitis 09/23/2015   - Medications: reviewed and updated   Objective:   Physical Exam BP (!) 143/79   Ht 5\' 6"  (1.676 m)   Wt 290 lb (131.5 kg)   BMI 46.81 kg/m  Gen: NAD, alert, cooperative with exam, well-appearing,  obese, pleasant Musculoskeletal: No visual deformity of her bilateral upper extremities.  Bilateral wrists are nontender to palpation.  Normal range of motion of bilateral wrists. Neurologic: Phalen and Tinel signs are negative, although Phalen sign produces pain of her left wrist.  Slightly reduced grip strength bilaterally, although wrist flexion and extension strength is 5/5 bilaterally.  Biceps strength 5/5 bilaterally.           Assessment & Plan:   Neuropathy of both upper extremities Although her symptoms initially raised concern for carpal tunnel syndrome, since her left upper extremity symptoms have not improved with nightly bracing and she has now developed symptoms on the right, she likely has neuropathy of some other source.  Notably, Phalen and Tinel signs were negative today.  An EMG may not be currently indicated since the diagnosis of carpal tunnel is unclear.  Will refer to neurology for further evaluation given her persistent neuropathic symptoms.    Lezlie Octave, M.D. 04/16/2019, 4:33 PM PGY-2, [facility]   Patient seen and evaluated with the resident.  I agree with the above plan of care.  Patient symptoms do not really fit carpal tunnel syndrome at this time.  I think she would benefit from referral to neurology.  I will defer further work-up and treatment to the discretion of neurology.  Patient will follow-up with [place] as needed.

## 2019-04-16 NOTE — Assessment & Plan Note (Addendum)
Exam consistent with mild hidradenitis suppurativa. Discussed options for treatment ad patient is aggravated by these areas ad wishes to seek further treatment. Areas with no fluctuant and with minimal pain on exam so no need for surgical consult at this time, and would likely not resolve issue -will started patient on 12 weeks of doxycycline therapy which may also help with her acne- she has IUD in place and states she is not currently sexually active -patient with no signs of hirsutism, but may consider low-dose spironolactone -Patient again counseled on weight loss in order to prevent further recurrence. -Advised to use warm compresses.   -return precautions discussed

## 2019-04-16 NOTE — Patient Instructions (Signed)
[facility] will be giving you a call to set up your first appointment. If you do not hear from them by Thursday, give THEM a call.

## 2019-04-20 ENCOUNTER — Telehealth (INDEPENDENT_AMBULATORY_CARE_PROVIDER_SITE_OTHER): Payer: Medicaid Other | Admitting: Family Medicine

## 2019-04-20 ENCOUNTER — Other Ambulatory Visit: Payer: Self-pay

## 2019-04-20 DIAGNOSIS — N898 Other specified noninflammatory disorders of vagina: Secondary | ICD-10-CM | POA: Diagnosis not present

## 2019-04-20 MED ORDER — KETOCONAZOLE 2 % EX CREA: 1.0000 "application " | TOPICAL_CREAM | Freq: Every day | CUTANEOUS | 0 refills | Status: DC

## 2019-04-20 NOTE — Progress Notes (Signed)
[facility] Telemedicine Visit  Patient consented to have virtual visit. Method of visit: Telephone  Encounter participants: Patient: Lauren Obrien - located at home Provider: [place] Shayleen Eppinger - located at [facility] Others (if applicable): n/a  Chief Complaint:  External Vaginal itching  HPI:  Calling because she is having itching on the outside of her vagina. No new rashes, maybe some redness. She says she has been scratching some at night. She has not trimmed her hair as discussed at our last visit, but has since stopped the clindamycin cream. Denies any vaginal discharge, recent intercourse, pain, discharge or swelling. No fevers, chills. Has not been sexually active. The itching is just bothersome, not interferring with everyday life.   ROS: per HPI  Pertinent PMHx: hidradenitis suppurativa   Exam:  Respiratory: normal work of breathing, speaking in full sentences  Assessment/Plan:  Itching in the vaginal area Patient seen last week by me with no signs of rash, or infection. Patient not sexually active. No concerning findings. Long hair on exam and patient counseled to trim, but has not. She has discontinued her abx cream. Reports some red rash possibly, will trial antifungal cream as patient cannot be examined with video and this is less concerning for office visit.     Time spent during visit with patient: 11 minutes

## 2019-04-23 ENCOUNTER — Other Ambulatory Visit: Payer: Self-pay | Admitting: Family Medicine

## 2019-04-23 DIAGNOSIS — N898 Other specified noninflammatory disorders of vagina: Secondary | ICD-10-CM | POA: Insufficient documentation

## 2019-04-23 NOTE — Assessment & Plan Note (Signed)
Patient seen last week by me with no signs of rash, or infection. Patient not sexually active. No concerning findings. Long hair on exam and patient counseled to trim, but has not. She has discontinued her abx cream. Reports some red rash possibly, will trial antifungal cream as patient cannot be examined with video and this is less concerning for office visit.

## 2019-04-24 NOTE — Telephone Encounter (Signed)
Pt is requesting a refill on Ketoconazole be called in. She has used all the cream and needs more. jw

## 2019-05-02 ENCOUNTER — Telehealth: Payer: Self-pay

## 2019-05-02 NOTE — Telephone Encounter (Signed)
Lvm via telephone spanish interpreter service reminding pt of her NCS/EMG scheduled for 05/07/19. Appt time provided.

## 2019-05-07 ENCOUNTER — Encounter: Payer: Medicaid Other | Admitting: Neurology

## 2019-05-07 ENCOUNTER — Ambulatory Visit (INDEPENDENT_AMBULATORY_CARE_PROVIDER_SITE_OTHER): Payer: Medicaid Other | Admitting: Student in an Organized Health Care Education/Training Program

## 2019-05-07 ENCOUNTER — Other Ambulatory Visit: Payer: Self-pay

## 2019-05-07 DIAGNOSIS — H6123 Impacted cerumen, bilateral: Secondary | ICD-10-CM | POA: Diagnosis not present

## 2019-05-07 NOTE — Patient Instructions (Signed)
It was a pleasure seeing you today in our clinic.   Our clinic's number is [phone]. Please call with questions or concerns about what we discussed today.  Be well, Dr. Mosetta PuttFeng   What is ear wax impaction? Ear wax impaction is when ear wax builds up enough to cause symptoms. Normally, ear wax helps to protect the insides of the ears and prevents injury or infection (figure 1). But having too much ear wax can cause symptoms such as pain and trouble hearing. The medical term for ear wax is "cerumen."  Young children and older adults are more likely than others to have ear wax impaction.  What causes ear wax impaction? Several different things can cause ear wax impaction:  ?Diseases that affect the ear - Some health problems can affect the shape of the inside of the ear, and make it hard for wax to move out. For example, skin problems that cause skin cells to shed a lot can lead to wax build-up in the ears.  ?A narrow ear canal (figure 2) - In some people, the ear canals are narrower than in others. These people might be more likely to have ear wax impaction. A person's ear canal can become narrower after an ear injury or after severe or multiple ear infections.  ?Changes in ear wax and lining due to aging - As people get older, their ear wax gets harder and thicker. This makes it difficult for the wax to move out of the ear as it should.  ?Bad ear-cleaning habits - Some people try to clean their ears using cotton swabs (Q-Tips) or other tools. This can actually push the wax deeper into the ear instead of getting it out. Over time, this can cause ear wax impaction.  ?Making too much ear wax - Some people make more ear wax than others. This can happen when water gets trapped in the ear, or when the ear is injured. But some people have a lot of ear wax for no obvious reason.  What are the symptoms of ear wax impaction? The symptoms include:  ?Trouble hearing  ?Pain in the ear  ?Hearing a  ringing noise in the ear  ?Feeling like the ear is blocked or plugged  These symptoms can happen in one or both ears.  Should I see a doctor or nurse? Yes. If you or your child has any of these symptoms, see a doctor or nurse. They can check the insides of the ears to figure out if the symptoms are caused by ear wax impaction or another problem, such as an ear infection.  Should I clean my (or my child's) ears at home? No. The insides of the ears do not usually need to be cleaned. Sticking anything into the ears can push the wax in deeper and cause impaction.  "Ear candling" involves lighting one end of a hollow candle, and putting the other end in the ear. Ear candling is not recommended for ear wax removal. It does not remove ear wax and can even cause ear injuries and burns.  How is ear wax impaction treated? There are several treatments to remove impacted ear wax. Doctors and nurses offer these treatments only to people who have bothersome symptoms. They do not recommend treatments for removing ear wax in people who have no symptoms, even if their ears are impacted.  In some cases, doctors and nurses will remove ear wax in people whose ears are impacted and who aren't able to let others know  if they have symptoms or not. This can include young children, and people who are confused or have trouble speaking, including some older adults.  There are several different ways to remove ear wax:  ?Ear drops - Special ear drops can soften ear wax and help it to drain out. Ear drops are not usually safe for people with an ear infection or damage to the eardrum.  ?Rinsing - In some cases, a doctor or nurse can remove impacted ear wax by squirting water (or a special liquid) into the ear to rinse it out.  ?Special tools - A doctor or nurse might use a special tool to remove ear wax. There are different types of tools that can do this safely. These include small sticks, hooks, and spoons. There are  also tools that use suction to pull the wax out.

## 2019-05-07 NOTE — Progress Notes (Signed)
   CC: bilateral cerumen impaction  HPI: Lauren Obrien is a 18 y.o. female with PMH significant for learning difficulty, acne, GERD who presents to [facility] today with cerumen impaction of two weeks duration.   Patient was in her usual state of health until two weeks ago when she developed the sensation of hearing her pulse in her left ear. She also feels she has decreased hearing bilaterally. She denies ear pain or drainage. No rhinorrhea, congestion, cough or sore throat. She has not tried to clean her ears at home because she has been told previously not to.  Review of Symptoms:  See HPI for ROS.   CC, SH/smoking status, and VS noted.  Objective: BP 112/68   Pulse 98   SpO2 98%  GEN: NAD, alert, cooperative, and pleasant. EYE: no conjunctival injection, pupils equally round and reactive to light ENMT: +cerumen impaction bilaterally improves with removal and normal tympanic light reflex bilaterally on second exam, no rhinorrhea, no pharyngeal erythema or exudates NECK: full ROM RESPIRATORY: clear to auscultation bilaterally with no wheezes, rhonchi or rales, good effort CV: RRR, no m/r/g GI: non-distended SKIN: warm and dry, no rashes or lesions NEURO: II-XII grossly intact PSYCH: AAOx3, appropriate affect  Assessment and plan:  Cerumen impaction Patient underwent cleaning of bilateral ears in the office today. A significant amount of cerumen was removed from the ears. She had improvement in symptoms afterwards. She continues to have some cerumen bilaterally and continues to hear her pulse in her left ear. - debrox daily x1 week for cerumen softening - present back to nurse clinic in one week for cerumen removal  Everrett Coombe, MD,MS,  PGY3 05/08/2019 8:40 AM

## 2019-05-08 DIAGNOSIS — H6120 Impacted cerumen, unspecified ear: Secondary | ICD-10-CM | POA: Insufficient documentation

## 2019-05-08 NOTE — Assessment & Plan Note (Signed)
Patient underwent cleaning of bilateral ears in the office today. A significant amount of cerumen was removed from the ears. She had improvement in symptoms afterwards. She continues to have some cerumen bilaterally and continues to hear her pulse in her left ear. - debrox daily x1 week for cerumen softening - present back to nurse clinic in one week for cerumen removal

## 2019-05-14 ENCOUNTER — Ambulatory Visit (INDEPENDENT_AMBULATORY_CARE_PROVIDER_SITE_OTHER): Payer: Medicaid Other | Admitting: Family Medicine

## 2019-05-14 ENCOUNTER — Other Ambulatory Visit: Payer: Self-pay

## 2019-05-14 ENCOUNTER — Encounter: Payer: Self-pay | Admitting: Family Medicine

## 2019-05-14 VITALS — BP 117/70 | HR 90

## 2019-05-14 DIAGNOSIS — M722 Plantar fascial fibromatosis: Secondary | ICD-10-CM | POA: Insufficient documentation

## 2019-05-14 DIAGNOSIS — H6123 Impacted cerumen, bilateral: Secondary | ICD-10-CM

## 2019-05-14 NOTE — Assessment & Plan Note (Signed)
Likely source of bilateral foot pain.  Exacerbated by weight and poor footwear.  No red flags.  Does have Pes cavus on exam. - Emphasized importance of good footwear with high arch - Discussed conservative management and given handout

## 2019-05-14 NOTE — Progress Notes (Signed)
   Subjective   Patient ID: Lauren Obrien    DOB: 11/24/2000, 18 y.o. female   MRN: [id number]  CC: "Ear impaction"  HPI: Lauren Obrien is a 19 y.o. female who presents to clinic today for the following:  Bilateral cerumen impaction: Ajani was seen last week for cerumen impaction for a 2-week duration.  She also reported a pulsation of her left ear which she describes as her heartbeat.  She continues to be without ear pain or drainage he denies URI symptoms including cough, congestion, rhinorrhea, or sore throat.  She does not clean her ears as previously told.  There was a considerable amount of cerumen removed from her ears during her last visit with some improvement of symptoms afterwards however she was instructed to use Debrox for 1 week in order to soften her cerumen.  She is here today to have this removed.  Bilateral foot pain: Ongoing issue over the last few weeks.  Patient states it is a sharp stinging sensation being at the heels and radiates across her arch bilaterally.  Pain is typically worse when she first gets up or with prolonged standing at [facility] or when wearing heels.  She has no loss of feeling or weakness in her lower extremities.  ROS: see HPI for pertinent.  [place]: Reviewed. Smoking status reviewed. Medications reviewed.  Objective   BP 117/70   Pulse 90   SpO2 98%  Vitals and nursing note reviewed.  General: obese, NAD with non-toxic appearance HEENT: normocephalic, atraumatic, moist mucous membranes, partially obstructed left ear by cerumen with patent ear canal on and gray TMs following irrigation without discharge or tenderness to palpation of ear or mastoid process Neck: supple, non-tender without lymphadenopathy Cardiovascular: regular rate and rhythm without murmurs, rubs, or gallops Lungs: clear to auscultation bilaterally with normal work of breathing Skin: warm, dry, no rashes or lesions Extremities: warm and well perfused, normal tone, no  edema, non-tender, neurovascular intact, high arch of plantar surface  Assessment & Plan   Cerumen impaction Improved with irrigation.  Likely source of pulsatile sensation in left ear with improvement following irrigation. - Advised to follow-up if pulsatile sensation continues - Discuss good ear hygiene  Plantar fasciitis Likely source of bilateral foot pain.  Exacerbated by weight and poor footwear.  No red flags.  Does have Pes cavus on exam. - Emphasized importance of good footwear with high arch - Discussed conservative management and given handout  No orders of the defined types were placed in this encounter.  No orders of the defined types were placed in this encounter.   Lauren Obrien, [place], PGY-3 05/14/2019, 4:09 PM

## 2019-05-14 NOTE — Assessment & Plan Note (Signed)
Improved with irrigation.  Likely source of pulsatile sensation in left ear with improvement following irrigation. - Advised to follow-up if pulsatile sensation continues - Discuss good ear hygiene

## 2019-05-14 NOTE — Patient Instructions (Signed)
Thank you for coming in to see [place] today. Please see below to review our plan for today's visit.  I would look into finding footwear with a higher arch to prevent your plantar fasciitis.  You can freeze a water bottle and roll it on your feet when sitting or watching TV.  This will alleviate some of the fascial pain you are experiencing.  This is a common condition and will usually resolve on its own but may take weeks to months to completely go away.  This can also flareup at times which is why emphasize getting good footwear.  If you continue having a pulsatile sensation in your left ear after a few weeks following the irrigation of your earwax, please follow-up here at the clinic.  Please call the clinic at [phone] if your symptoms worsen or you have any concerns. It was our pleasure to serve you.  Harriet [place], [place], PGY-3     Plantar Fasciitis  Plantar fasciitis is a painful foot condition that affects the heel. It occurs when the band of tissue that connects the toes to the heel bone (plantar fascia) becomes irritated. This can happen as the result of exercising too much or doing other repetitive activities (overuse injury). The pain from plantar fasciitis can range from mild irritation to severe pain that makes it difficult to walk or move. The pain is usually worse in the morning after sleeping, or after sitting or lying down for a while. Pain may also be worse after long periods of walking or standing. What are the causes? This condition may be caused by:  Standing for long periods of time.  Wearing shoes that do not have good arch support.  Doing activities that put stress on joints (high-impact activities), including running, aerobics, and ballet.  Being overweight.  An abnormal way of walking (gait).  Tight muscles in the back of your lower leg (calf).  High arches in your feet.  Starting a new athletic activity. What are the signs or  symptoms? The main symptom of this condition is heel pain. Pain may:  Be worse with first steps after a time of rest, especially in the morning after sleeping or after you have been sitting or lying down for a while.  Be worse after long periods of standing still.  Decrease after 30-45 minutes of activity, such as gentle walking. How is this diagnosed? This condition may be diagnosed based on your medical history and your symptoms. Your health care provider may ask questions about your activity level. Your health care provider will do a physical exam to check for:  A tender area on the bottom of your foot.  A high arch in your foot.  Pain when you move your foot.  Difficulty moving your foot. You may have imaging tests to confirm the diagnosis, such as:  X-rays.  Ultrasound.  MRI. How is this treated? Treatment for plantar fasciitis depends on how severe your condition is. Treatment may include:  Rest, ice, applying pressure (compression), and raising the affected foot (elevation). This may be called RICE therapy. Your health care provider may recommend RICE therapy along with over-the-counter pain medicines to manage your pain.  Exercises to stretch your calves and your plantar fascia.  A splint that holds your foot in a stretched, upward position while you sleep (night splint).  Physical therapy to relieve symptoms and prevent problems in the future.  Injections of steroid medicine (cortisone) to relieve pain and inflammation.  Stimulating your plantar fascia with electrical impulses (extracorporeal shock wave therapy). This is usually the last treatment option before surgery.  Surgery, if other treatments have not worked after 12 months. Follow these instructions at home:  Managing pain, stiffness, and swelling  If directed, put ice on the painful area: ? Put ice in a plastic bag, or use a frozen bottle of water. ? Place a towel between your skin and the bag or  bottle. ? Roll the bottom of your foot over the bag or bottle. ? Do this for 20 minutes, 2-3 times a day.  Wear athletic shoes that have air-sole or gel-sole cushions, or try wearing soft shoe inserts that are designed for plantar fasciitis.  Raise (elevate) your foot above the level of your heart while you are sitting or lying down. Activity  Avoid activities that cause pain. Ask your health care provider what activities are safe for you.  Do physical therapy exercises and stretches as told by your health care provider.  Try activities and forms of exercise that are easier on your joints (low-impact). Examples include swimming, water aerobics, and biking. General instructions  Take over-the-counter and prescription medicines only as told by your health care provider.  Wear a night splint while sleeping, if told by your health care provider. Loosen the splint if your toes tingle, become numb, or turn cold and blue.  Maintain a healthy weight, or work with your health care provider to lose weight as needed.  Keep all follow-up visits as told by your health care provider. This is important. Contact a health care provider if you:  Have symptoms that do not go away after caring for yourself at home.  Have pain that gets worse.  Have pain that affects your ability to move or do your daily activities. Summary  Plantar fasciitis is a painful foot condition that affects the heel. It occurs when the band of tissue that connects the toes to the heel bone (plantar fascia) becomes irritated.  The main symptom of this condition is heel pain that may be worse after exercising too much or standing still for a long time.  Treatment varies, but it usually starts with rest, ice, compression, and elevation (RICE therapy) and over-the-counter medicines to manage pain. This information is not intended to replace advice given to you by your health care provider. Make sure you discuss any questions you  have with your health care provider. Document Released: 07/27/2001 Document Revised: 10/14/2017 Document Reviewed: 08/29/2017 Elsevier Patient Education  2020 [employer]

## 2019-05-25 ENCOUNTER — Ambulatory Visit: Payer: Medicaid Other | Admitting: Family Medicine

## 2019-05-29 ENCOUNTER — Other Ambulatory Visit: Payer: Self-pay

## 2019-05-29 ENCOUNTER — Telehealth: Payer: Self-pay | Admitting: Family Medicine

## 2019-05-29 ENCOUNTER — Encounter: Payer: Self-pay | Admitting: Family Medicine

## 2019-05-29 ENCOUNTER — Ambulatory Visit (INDEPENDENT_AMBULATORY_CARE_PROVIDER_SITE_OTHER): Payer: Medicaid Other | Admitting: Family Medicine

## 2019-05-29 VITALS — BP 110/80 | HR 100 | Temp 98.4°F | Wt 303.0 lb

## 2019-05-29 DIAGNOSIS — L83 Acanthosis nigricans: Secondary | ICD-10-CM | POA: Diagnosis not present

## 2019-05-29 DIAGNOSIS — H6123 Impacted cerumen, bilateral: Secondary | ICD-10-CM

## 2019-05-29 DIAGNOSIS — G44229 Chronic tension-type headache, not intractable: Secondary | ICD-10-CM | POA: Diagnosis not present

## 2019-05-29 DIAGNOSIS — R7309 Other abnormal glucose: Secondary | ICD-10-CM

## 2019-05-29 LAB — POCT GLYCOSYLATED HEMOGLOBIN (HGB A1C): HbA1c, POC (prediabetic range): 5.7 % (ref 5.7–6.4)

## 2019-05-29 MED ORDER — VENLAFAXINE HCL ER 37.5 MG PO CP24: 37.5000 mg | ORAL_CAPSULE | Freq: Every day | ORAL | 2 refills | Status: DC

## 2019-05-29 NOTE — Patient Instructions (Addendum)
It was nice seeing you today Lauren Obrien!  We cleaned out more of the wax from your ears, so hopefully this will help your sense of fullness and the heartbeat you here there.  If your heartbeat continues to be heard in your ears, we will continue to think about this together.  Please talk with your neurologist about the throbbing pain you feel on your right elbow.  I think this is connected to the symptoms in your arms that you have been having for a while.  We checked your hemoglobin A1c today, which can show [place] what your blood sugars have been averaging over the last 3 months.  I will call you with these results.  We are starting a medication for your tension headaches today called venlafaxine.  Please take this once per day and let me know if you are having any issues with it.  If you have any questions or concerns, please feel free to call the clinic.   Be well,  Dr. Shan Levans Venlafaxine extended-release tablets What is this medicine? VENLAFAXINE (VEN la fax een) is used to treat depression and anxiety. This medicine may be used for other purposes; ask your health care provider or pharmacist if you have questions. COMMON BRAND NAME(S): Venlafaxine What should I tell my health care provider before I take this medicine? They need to know if you have any of these conditions:  bleeding disorders  glaucoma  heart disease  high blood pressure  high cholesterol  kidney disease  liver disease  low levels of sodium in the blood  mania or bipolar disorder  seizures  suicidal thoughts, plans, or attempt; a previous suicide attempt by you or a family  take medicines that treat or prevent blood clots  thyroid disease  an unusual or allergic reaction to venlafaxine, desvenlafaxine, other medicines, foods, dyes, or preservatives  pregnant or trying to get pregnant  breast-feeding How should I use this medicine? Take this medicine by mouth with a full glass of water. Follow the  directions on the prescription label. Do not cut, crush, or chew this medicine. Take it with food. Try to take your medicine at about the same time each day. Do not take your medicine more often than directed. Do not stop taking this medicine suddenly except upon the advice of your doctor. Stopping this medicine too quickly may cause serious side effects or your condition may worsen. A special MedGuide will be given to you by the pharmacist with each prescription and refill. Be sure to read this information carefully each time. Talk to your pediatrician regarding the use of this medicine in children. Special care may be needed. Overdosage: If you think you have taken too much of this medicine contact a poison control center or emergency room at once. NOTE: This medicine is only for you. Do not share this medicine with others. What if I miss a dose? If you miss a dose, take it as soon as you can. If it is almost time for your next dose, take only that dose. Do not take double or extra doses. What may interact with this medicine? Do not take this medicine with any of the following medications:  certain medicines for fungal infections like fluconazole, itraconazole, ketoconazole, posaconazole, voriconazole  cisapride  desvenlafaxine  dronedarone  duloxetine  levomilnacipran  linezolid  MAOIs like Carbex, Eldepryl, Marplan, Nardil, and Parnate  methylene blue (injected into a vein)  milnacipran  pimozide  thioridazine This medicine may also interact with the following  medications:  amphetamines  aspirin and aspirin-like medicines  certain medicines for depression, anxiety, or psychotic disturbances  certain medicines for migraine headaches like almotriptan, eletriptan, frovatriptan, naratriptan, rizatriptan, sumatriptan, zolmitriptan  certain medicines for sleep  certain medicines that treat or prevent blood clots like dalteparin, enoxaparin,  warfarin  cimetidine  clozapine  diuretics  fentanyl  furazolidone  indinavir  isoniazid  lithium  metoprolol  NSAIDS, medicines for pain and inflammation, like ibuprofen or naproxen  other medicines that prolong the QT interval (cause an abnormal heart rhythm) like dofetilide, ziprasidone  procarbazine  rasagiline  supplements like St. John's wort, kava kava, valerian  tramadol  tryptophan This list may not describe all possible interactions. Give your health care provider a list of all the medicines, herbs, non-prescription drugs, or dietary supplements you use. Also tell them if you smoke, drink alcohol, or use illegal drugs. Some items may interact with your medicine. What should I watch for while using this medicine? Tell your doctor if your symptoms do not get better or if they get worse. Visit your doctor or health care professional for regular checks on your progress. Because it may take several weeks to see the full effects of this medicine, it is important to continue your treatment as prescribed by your doctor. Patients and their families should watch out for new or worsening thoughts of suicide or depression. Also watch out for sudden changes in feelings such as feeling anxious, agitated, panicky, irritable, hostile, aggressive, impulsive, severely restless, overly excited and hyperactive, or not being able to sleep. If this happens, especially at the beginning of treatment or after a change in dose, call your health care professional. This medicine can cause an increase in blood pressure. Check with your doctor for instructions on monitoring your blood pressure while taking this medicine. You may get drowsy or dizzy. Do not drive, use machinery, or do anything that needs mental alertness until you know how this medicine affects you. Do not stand or sit up quickly, especially if you are an older patient. This reduces the risk of dizzy or fainting spells. Alcohol may  interfere with the effect of this medicine. Avoid alcoholic drinks. Your mouth may get dry. Chewing sugarless gum, sucking hard candy and drinking plenty of water will help. Contact your doctor if the problem does not go away or is severe. What side effects may I notice from receiving this medicine? Side effects that you should report to your doctor or health care professional as soon as possible:  allergic reactions like skin rash, itching or hives, swelling of the face, lips, or tongue  anxious  breathing problems  confusion  changes in vision  chest pain  confusion  elevated mood, decreased need for sleep, racing thoughts, impulsive behavior  eye pain  fast, irregular heartbeat  feeling faint or lightheaded, falls  feeling agitated, angry, or irritable  hallucination, loss of contact with reality  high blood pressure  loss of balance or coordination  palpitations  redness, blistering, peeling or loosening of the skin, including inside the mouth  restlessness, pacing, inability to keep still  seizures  stiff muscles  suicidal thoughts or other mood changes  trouble passing urine or change in the amount of urine  trouble sleeping  unusual bleeding or bruising  unusually weak or tired  vomiting Side effects that usually do not require medical attention (report to your doctor or health care professional if they continue or are bothersome):  change in sex drive or performance  change in appetite or weight  constipation  dizziness  dry mouth  headache  increased sweating  nausea  tired This list may not describe all possible side effects. Call your doctor for medical advice about side effects. You may report side effects to FDA at 1-800-FDA-1088. Where should I keep my medicine? Keep out of the reach of children. Store at a controlled temperature between 15 and 30 degrees C (59 degrees and 86 degrees F). Throw away any unused medicine after  the expiration date. NOTE: This sheet is a summary. It may not cover all possible information. If you have questions about this medicine, talk to your doctor, pharmacist, or health care provider.  2020 Elsevier/Gold Standard (2018-10-24 12:07:41)  Tension Headache, Adult A tension headache is pain, pressure, or aching in your head. Tension headaches can last from 30 minutes to several days. Follow these instructions at home: Managing pain  Take over-the-counter and prescription medicines only as told by your doctor.  When you have a headache, lie down in a dark, quiet room.  If told, put ice on your head and neck: ? Put ice in a plastic bag. ? Place a towel between your skin and the bag. ? Leave the ice on for 20 minutes, 2-3 times a day.  If told, put heat on the back of your neck. Do this as often as your doctor tells you to. Use the kind of heat that your doctor recommends, such as a moist heat pack or a heating pad. ? Place a towel between your skin and the heat. ? Leave the heat on for 20-30 minutes. ? Remove the heat if your skin turns bright red. Eating and drinking  Eat meals on a regular schedule.  Watch how much alcohol you drink: ? If you are a woman and are not pregnant, do not drink more than 1 drink a day. ? If you are a man, do not drink more than 2 drinks a day.  Drink enough fluid to keep your pee (urine) pale yellow.  Do not use a lot of caffeine, or stop using caffeine. Lifestyle  Get enough sleep. Get 7-9 hours of sleep each night. Or get the amount of sleep that your doctor tells you to.  At bedtime, remove all electronic devices from your room. Examples of electronic devices are computers, phones, and tablets.  Find ways to lessen your stress. Some things that can lessen stress are: ? Exercise. ? Deep breathing. ? Yoga. ? Music. ? Positive thoughts.  Sit up straight. Do not tighten (tense) your muscles.  Do not use any products that have nicotine  or tobacco in them, such as cigarettes and e-cigarettes. If you need help quitting, ask your doctor. General instructions   Keep all follow-up visits as told by your doctor. This is important.  Avoid things that can bring on headaches. Keep a journal to find out if certain things bring on headaches. For example, write down: ? What you eat and drink. ? How much sleep you get. ? Any change to your diet or medicines. Contact a doctor if:  Your headache does not get better.  Your headache comes back.  You have a headache and sounds, light, or smells bother you.  You feel sick to your stomach (nauseous) or you throw up (vomit).  Your stomach hurts. Get help right away if:  You suddenly get a very bad headache along with any of these: ? A stiff neck. ? Feeling sick to your stomach. ?  Throwing up. ? Feeling weak. ? Trouble seeing. ? Feeling short of breath. ? A rash. ? Feeling unusually sleepy. ? Trouble speaking. ? Pain in your eye or ear. ? Trouble walking or balancing. ? Feeling like you will pass out (faint). ? Passing out. Summary  A tension headache is pain, pressure, or aching in your head.  Tension headaches can last from 30 minutes to several days.  Lifestyle changes and medicines may help relieve pain. This information is not intended to replace advice given to you by your health care provider. Make sure you discuss any questions you have with your health care provider. Document Released: 01/26/2010 Document Revised: 10/14/2017 Document Reviewed: 02/11/2017 Elsevier Patient Education  2020 [employer]

## 2019-05-29 NOTE — Progress Notes (Signed)
   Subjective:    Lauren Obrien - 18 y.o. female MRN [id number]  Date of birth: [birth year]  CC:  Lauren Obrien is here for ear fullness, headaches, and she would like her hemoglobin A1c to be checked.  HPI: Ear fullness Has been using the drops to soften her earwax for a while, but she continues to have some fullness.  This did feel some better after irrigation at her last visit.  She continues to hear her heartbeat sometimes, but this is not constant.  Headaches Patient reports that she experiences headaches mostly in the afternoon that are sometimes constant and sometimes throbbing in nature.  Their location is generalized and they are not accompanied by nausea or vomiting.  She does have some photophobia with her headaches.  Concerned about hemoglobin A1c Patient is worried that her sugars have been elevated because she feels dizzy after eating sugary foods or drinking soft drinks.  She is requesting that her hemoglobin A1c be checked today.  Health Maintenance:  There are no preventive care reminders to display for this patient.  -  reports that she has never smoked. She has never used smokeless tobacco. - Review of Systems: Per HPI. - Past Medical History: Patient Active Problem List   Diagnosis Date Noted  . Chronic tension type headache 05/29/2019  . Acanthosis nigricans 05/29/2019  . Plantar fasciitis 05/14/2019  . Cerumen impaction 05/08/2019  . Itching in the vaginal area 04/23/2019  . 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 Hidradenitis suppurativa 03/04/2019  . Carpal tunnel syndrome of left wrist 01/04/2019  . Blurry vision, bilateral 12/16/2018  . Learning difficulty 12/16/2018  . Left wrist tendinitis 12/01/2018  . Sleep deprivation 09/28/2018  . Recurrent boils 09/21/2018  . Irregular menstrual bleeding 04/03/2018  . Acne vulgaris 04/03/2018  . Helicobacter pylori gastritis 03/05/2016  . GERD (gastroesophageal reflux disease) 01/21/2016   . Allergic rhinitis 09/23/2015   - Medications: reviewed and updated   Objective:   Physical Exam BP 110/80   Pulse 100   Temp 98.4 F (36.9 C) (Oral)   Wt (!) 303 lb (137.4 kg)   SpO2 98%   BMI 48.91 kg/m  Gen: NAD, alert, cooperative with exam, well-appearing, pleasant, obese HEENT: NCAT, clear conjunctiva, significant cerumen in bilateral auditory canals, supple neck CV: RRR, good S1/S2, no murmur, no edema Resp: CTABL, no wheezes, non-labored Abd: SNTND, BS present, no guarding or organomegaly Skin: no rashes, normal turgor, acanthosis nigricans on posterior neck Neuro: no gross deficits.     Assessment & Plan:   Cerumen impaction Ear irrigation was performed today.  Will follow up on her hearing the pulsations in her ear at a later visit if this continues after cerumen has been cleared.  Chronic tension type headache I suspect that these headaches are not migraines because they are not accompanied by nausea, and patient has other triggers such as sleep deprivation and living in a stressful social situation.  Will try venlafaxine for treatment of her headaches since they occur daily.  This may also help improve her sleep and help with anxiety.  Acanthosis nigricans Patient's obesity and acanthosis nigricans do put her at higher risk of developing insulin resistance.  We will check a hemoglobin A1c today and call patient to discuss the result.    Maia Breslow, M.D. 05/29/2019, 3:54 PM PGY-3, [place]

## 2019-05-29 NOTE — Assessment & Plan Note (Signed)
Patient's obesity and acanthosis nigricans do put her at higher risk of developing insulin resistance.  We will check a hemoglobin A1c today and call patient to discuss the result.

## 2019-05-29 NOTE — Assessment & Plan Note (Signed)
I suspect that these headaches are not migraines because they are not accompanied by nausea, and patient has other triggers such as sleep deprivation and living in a stressful social situation.  Will try venlafaxine for treatment of her headaches since they occur daily.  This may also help improve her sleep and help with anxiety.

## 2019-05-29 NOTE — Assessment & Plan Note (Signed)
Ear irrigation was performed today.  Will follow up on her hearing the pulsations in her ear at a later visit if this continues after cerumen has been cleared.

## 2019-05-29 NOTE — Telephone Encounter (Signed)
Pt would like for someone to call her with the results from her lab work taken this morning 07/14.

## 2019-05-30 NOTE — Telephone Encounter (Signed)
Left voicemail with patient's A1c result of 5.7.  I explained that this is just barely in the prediabetic range, which means that she needs to reduce the amount of sugary beverages and foods that she eats and get some activity every day in order to keep from developing type 2 diabetes in the future.  I told her that we can trend her A1c every 6 months to 1 year if she would like.

## 2019-06-25 ENCOUNTER — Encounter: Payer: Self-pay | Admitting: *Deleted

## 2019-06-26 ENCOUNTER — Ambulatory Visit: Payer: Medicaid Other | Admitting: Diagnostic Neuroimaging

## 2019-06-26 ENCOUNTER — Other Ambulatory Visit: Payer: Self-pay

## 2019-06-26 ENCOUNTER — Encounter: Payer: Self-pay | Admitting: Diagnostic Neuroimaging

## 2019-06-26 VITALS — BP 110/82 | HR 72 | Temp 98.7°F | Ht 66.0 in | Wt 300.0 lb

## 2019-06-26 DIAGNOSIS — R200 Anesthesia of skin: Secondary | ICD-10-CM | POA: Diagnosis not present

## 2019-06-26 DIAGNOSIS — M25531 Pain in right wrist: Secondary | ICD-10-CM

## 2019-06-26 DIAGNOSIS — M25532 Pain in left wrist: Secondary | ICD-10-CM | POA: Diagnosis not present

## 2019-06-26 DIAGNOSIS — R413 Other amnesia: Secondary | ICD-10-CM

## 2019-06-26 NOTE — Progress Notes (Signed)
[facility]  PATIENT: Lauren ShorterYensi Vente Obrien DOB: October 25, 2001  REFERRING CLINICIAN: Lezlie OctaveWinfrey, Amanda HISTORY FROM: patient  REASON FOR VISIT: new consult    HISTORICAL  CHIEF COMPLAINT:  Chief Complaint  Patient presents with  . Neuropathy    rm 7 New Pt "numbness with pan in left arm, right wrist"    HISTORY OF PRESENT ILLNESS:   18 year old female here for evaluation of upper extremity numbness, pain and weakness.  Symptoms started in January 2020.  She describes pain and weakness in her left upper extremity, mainly in her left wrist region.  Symptoms of spread to her right side.  No specific prodromal accidents injuries or traumas.  She has been under significant social stress, lack of sleep and other factors in 2019.  No problems with legs.  No problems with face or vision.  She has been having some nonspecific memory problems.   REVIEW OF SYSTEMS: Full 14 system review of systems performed and negative with exception of: As per HPI.   ALLERGIES: No Known Allergies  HOME MEDICATIONS: Outpatient Medications Prior to Visit  Medication Sig Dispense Refill  . doxycycline (VIBRAMYCIN) 100 MG capsule Take 1 capsule (100 mg total) by mouth daily. 84 capsule 0  . cetirizine (ZYRTEC) 10 MG tablet Take 1 tablet (10 mg total) by mouth daily. (Patient not taking: Reported on 06/26/2019) 30 tablet 11  . clindamycin-benzoyl peroxide (BENZACLIN) gel Apply topically 2 (two) times daily. (Patient not taking: Reported on 06/26/2019) 50 g 1  . clotrimazole-betamethasone (LOTRISONE) cream Apply 1 application topically 2 (two) times daily. (Patient not taking: Reported on 06/26/2019) 30 g 0  . famotidine (PEPCID) 40 MG tablet Take 1 tablet (40 mg total) by mouth daily. (Patient not taking: Reported on 06/26/2019) 30 tablet 11  . fluticasone (FLONASE) 50 MCG/ACT nasal spray Place 2 sprays into both nostrils daily. (Patient not taking: Reported on 06/26/2019) 16 g 6  . ibuprofen  (ADVIL,MOTRIN) 600 MG tablet TAKE 1 TABLET BY MOUTH EVERY 8 HOURS AS NEEDED (Patient not taking: Reported on 06/26/2019) 30 tablet 0  . ketoconazole (NIZORAL) 2 % cream APPLY 1 APPLICATION TOPICALLY DAILY (Patient not taking: Reported on 06/26/2019) 30 g 0  . loperamide (IMODIUM A-D) 2 MG tablet Take 1 tablet (2 mg total) by mouth 4 (four) times daily as needed for diarrhea or loose stools. (Patient not taking: Reported on 06/26/2019) 30 tablet 0  . meloxicam (MOBIC) 15 MG tablet Take 1 tablet (15 mg total) by mouth daily. (Patient not taking: Reported on 06/26/2019) 30 tablet 1  . mupirocin cream (BACTROBAN) 2 % Apply 1 application topically 2 (two) times daily. (Patient not taking: Reported on 06/26/2019) 15 g 0  . mupirocin ointment (BACTROBAN) 2 % Place 1 application into the nose 2 (two) times daily. (Patient not taking: Reported on 06/26/2019) 22 g 0  . polyethylene glycol powder (GLYCOLAX/MIRALAX) powder Use as directed by MD (Patient not taking: Reported on 06/26/2019) 500 g 0  . venlafaxine XR (EFFEXOR-XR) 37.5 MG 24 hr capsule Take 1 capsule (37.5 mg total) by mouth daily with breakfast. (Patient not taking: Reported on 06/26/2019) 30 capsule 2   No facility-administered medications prior to visit.     PAST MEDICAL HISTORY: Past Medical History:  Diagnosis Date  . Acne vulgaris   . GERD (gastroesophageal reflux disease)   . Helicobacter pylori gastritis   . Hidradenitis suppurativa   . Neuropathy    BUE    PAST SURGICAL HISTORY: No past surgical history  on file.  FAMILY HISTORY: Family History  Problem Relation Age of Onset  . Diabetes Father   . Hypertension Father   . Diabetes Paternal Grandmother   . Diabetes Paternal Grandfather   . Diabetes Maternal Grandfather   . Asthma Maternal Grandmother   . Diabetes Maternal Grandmother     SOCIAL HISTORY: Social History   Socioeconomic History  . Marital status: Single    Spouse name: Not on file  . Number of children: 1  .  Years of education: 46  . Highest education level: Not on file  Occupational History    Comment: [place] student  Social Needs  . Financial resource strain: Not on file  . Food insecurity    Worry: Not on file    Inability: Not on file  . Transportation needs    Medical: Not on file    Non-medical: Not on file  Tobacco Use  . Smoking status: Never Smoker  . Smokeless tobacco: Never Used  Substance and Sexual Activity  . Alcohol use: No    Alcohol/week: 0.0 standard drinks  . Drug use: No  . Sexual activity: Not Currently    Comment: 5 months post-partum  Lifestyle  . Physical activity    Days per week: Not on file    Minutes per session: Not on file  . Stress: Not on file  Relationships  . Social Herbalist on phone: Not on file    Gets together: Not on file    Attends religious service: Not on file    Active member of club or organization: Not on file    Attends meetings of clubs or organizations: Not on file    Relationship status: Not on file  . Intimate partner violence    Fear of current or ex partner: Not on file    Emotionally abused: Not on file    Physically abused: Not on file    Forced sexual activity: Not on file  Other Topics Concern  . Not on file  Social History Narrative       11th grade does not do well in school     PHYSICAL EXAM  GENERAL EXAM/CONSTITUTIONAL: Vitals:  Vitals:   06/26/19 1456  BP: 110/82  Pulse: 72  Temp: 98.7 F (37.1 C)  Weight: 300 lb (136.1 kg)  Height: 5\' 6"  (1.676 m)     Body mass index is 48.42 kg/m. Wt Readings from Last 3 Encounters:  06/26/19 300 lb (136.1 kg) (>99 %, Z= 2.77)*  05/29/19 (!) 303 lb (137.4 kg) (>99 %, Z= 2.78)*  04/16/19 290 lb (131.5 kg) (>99 %, Z= 2.71)*   * Growth percentiles are based on CDC (Girls, 2-20 Years) data.     Patient is in no distress; well developed, nourished and groomed; neck is supple  CARDIOVASCULAR:  Examination of carotid arteries is normal; no carotid  bruits  Regular rate and rhythm, no murmurs  Examination of peripheral vascular system by observation and palpation is normal  EYES:  Ophthalmoscopic exam of optic discs and posterior segments is normal; no papilledema or hemorrhages  No exam data present  MUSCULOSKELETAL:  Gait, strength, tone, movements noted in Neurologic exam below  NEUROLOGIC: MENTAL STATUS:  No flowsheet data found.  awake, alert, oriented to person, place and time  recent and remote memory intact  normal attention and concentration  language fluent, comprehension intact, naming intact  fund of knowledge appropriate  CRANIAL NERVE:   2nd - no papilledema  on fundoscopic exam  2nd, 3rd, 4th, 6th - pupils equal and reactive to light, visual fields full to confrontation, extraocular muscles intact, no nystagmus  5th - facial sensation symmetric  7th - facial strength symmetric  8th - hearing intact  9th - palate elevates symmetrically, uvula midline  11th - shoulder shrug symmetric  12th - tongue protrusion midline  MOTOR:   normal bulk and tone, full strength in the BUE, BLE  SENSORY:   normal and symmetric to light touch, pinprick, temperature, vibration  COORDINATION:   finger-nose-finger, fine finger movements normal  REFLEXES:   deep tendon reflexes present and symmetric  GAIT/STATION:   narrow based gait     DIAGNOSTIC DATA (LABS, IMAGING, TESTING) - I reviewed patient records, labs, notes, testing and imaging myself where available.  Lab Results  Component Value Date   WBC 13.7 (H) 08/13/2015   HGB 12.4 08/13/2015   HCT 38.0 08/13/2015   MCV 80.7 08/13/2015   PLT 278 08/13/2015      Component Value Date/Time   NA 136 08/13/2015 2120   K 4.0 08/13/2015 2120   CL 105 08/13/2015 2120   CO2 21 (L) 08/13/2015 2120   GLUCOSE 96 08/13/2015 2120   BUN 9 08/13/2015 2120   CREATININE 0.71 08/13/2015 2120   CALCIUM 9.0 08/13/2015 2120   PROT 7.4 08/13/2015 2120    ALBUMIN 3.4 (L) 08/13/2015 2120   AST 34 08/13/2015 2120   ALT 44 08/13/2015 2120   ALKPHOS 104 08/13/2015 2120   BILITOT 0.6 08/13/2015 2120   GFRNONAA NOT CALCULATED 08/13/2015 2120   GFRAA NOT CALCULATED 08/13/2015 2120   No results found for: CHOL, HDL, LDLCALC, LDLDIRECT, TRIG, CHOLHDL Lab Results  Component Value Date   HGBA1C 5.7 05/29/2019   No results found for: [id number] Lab Results  Component Value Date   TSH 1.54 01/21/2016      ASSESSMENT AND PLAN  18 y.o. year old female here with:  Dx:  1. Numbness of upper extremity   2. Pain in both wrists   3. Memory loss      PLAN:  - check MRI brain and cervical spine (eval for demyelinating dz; cervical radiculopathy) - check labs - then may consider EMG/NCS in future  Orders Placed This Encounter  Procedures  . ANA,IFA RA Diag Pnl w/rflx Tit/Patn  . CBC with Differential/Platelet  . Comprehensive metabolic panel  . Vitamin B12  . TSH  . Uric Acid   Return pending test results, for pending if symptoms worsen or fail to improve.    Suanne MarkerVIKRAM R. , MD 06/26/2019, 3:22 PM Certified in Neurology, Neurophysiology and Neuroimaging  [facility] [address], [address] [place], [place] [address] [phone]

## 2019-06-28 LAB — CBC WITH DIFFERENTIAL/PLATELET
Basophils Absolute: 0.1 10*3/uL (ref 0.0–0.2)
Basos: 1 %
EOS (ABSOLUTE): 0.1 10*3/uL (ref 0.0–0.4)
Eos: 1 %
Hematocrit: 38.1 % (ref 34.0–46.6)
Hemoglobin: 12.7 g/dL (ref 11.1–15.9)
Immature Grans (Abs): 0 10*3/uL (ref 0.0–0.1)
Immature Granulocytes: 0 %
Lymphocytes Absolute: 3.9 10*3/uL — ABNORMAL HIGH (ref 0.7–3.1)
Lymphs: 43 %
MCH: 25.5 pg — ABNORMAL LOW (ref 26.6–33.0)
MCHC: 33.3 g/dL (ref 31.5–35.7)
MCV: 76 fL — ABNORMAL LOW (ref 79–97)
Monocytes Absolute: 0.7 10*3/uL (ref 0.1–0.9)
Monocytes: 8 %
Neutrophils Absolute: 4.2 10*3/uL (ref 1.4–7.0)
Neutrophils: 47 %
Platelets: 287 10*3/uL (ref 150–450)
RBC: 4.99 x10E6/uL (ref 3.77–5.28)
RDW: 12.7 % (ref 11.7–15.4)
WBC: 9.1 10*3/uL (ref 3.4–10.8)

## 2019-06-28 LAB — COMPREHENSIVE METABOLIC PANEL
ALT: 20 IU/L (ref 0–32)
AST: 14 IU/L (ref 0–40)
Albumin/Globulin Ratio: 1.8 (ref 1.2–2.2)
Albumin: 4.2 g/dL (ref 3.9–5.0)
Alkaline Phosphatase: 90 IU/L (ref 43–101)
BUN/Creatinine Ratio: 14 (ref 9–23)
BUN: 9 mg/dL (ref 6–20)
Bilirubin Total: 0.4 mg/dL (ref 0.0–1.2)
CO2: 24 mmol/L (ref 20–29)
Calcium: 9.6 mg/dL (ref 8.7–10.2)
Chloride: 101 mmol/L (ref 96–106)
Creatinine, Ser: 0.66 mg/dL (ref 0.57–1.00)
GFR calc Af Amer: 149 mL/min/{1.73_m2} (ref >59)
GFR calc non Af Amer: 129 mL/min/{1.73_m2} (ref >59)
Globulin, Total: 2.4 g/dL (ref 1.5–4.5)
Glucose: 105 mg/dL — ABNORMAL HIGH (ref 65–99)
Potassium: 4.5 mmol/L (ref 3.5–5.2)
Sodium: 139 mmol/L (ref 134–144)
Total Protein: 6.6 g/dL (ref 6.0–8.5)

## 2019-06-28 LAB — TSH: TSH: 1.71 u[IU]/mL (ref 0.450–4.500)

## 2019-06-28 LAB — ANA,IFA RA DIAG PNL W/RFLX TIT/PATN
ANA Titer 1: NEGATIVE
Cyclic Citrullin Peptide Ab: 4 units (ref 0–19)
Rhuematoid fact SerPl-aCnc: <10 IU/mL (ref 0.0–13.9)

## 2019-06-28 LAB — VITAMIN B12: Vitamin B-12: 472 pg/mL (ref 232–1245)

## 2019-06-28 LAB — URIC ACID: Uric Acid: 4.8 mg/dL (ref 2.5–7.1)

## 2019-06-29 ENCOUNTER — Ambulatory Visit (INDEPENDENT_AMBULATORY_CARE_PROVIDER_SITE_OTHER): Payer: Medicaid Other | Admitting: Family Medicine

## 2019-06-29 ENCOUNTER — Other Ambulatory Visit: Payer: Self-pay

## 2019-06-29 ENCOUNTER — Encounter: Payer: Self-pay | Admitting: Family Medicine

## 2019-06-29 VITALS — BP 118/68 | HR 95 | Wt 299.0 lb

## 2019-06-29 DIAGNOSIS — R413 Other amnesia: Secondary | ICD-10-CM | POA: Diagnosis not present

## 2019-06-29 DIAGNOSIS — R3915 Urgency of urination: Secondary | ICD-10-CM | POA: Diagnosis not present

## 2019-06-29 LAB — POCT URINALYSIS DIP (MANUAL ENTRY)
Bilirubin, UA: NEGATIVE
Blood, UA: NEGATIVE
Glucose, UA: NEGATIVE mg/dL
Ketones, POC UA: NEGATIVE mg/dL
Leukocytes, UA: NEGATIVE
Nitrite, UA: NEGATIVE
Protein Ur, POC: NEGATIVE mg/dL
Spec Grav, UA: 1.025 (ref 1.010–1.025)
Urobilinogen, UA: 0.2 E.U./dL
pH, UA: 6 (ref 5.0–8.0)

## 2019-06-29 NOTE — Patient Instructions (Addendum)
It was nice seeing you today Lauren Obrien!  We are testing her urine today.  I will let you know if this is not normal.  Keep working on reducing your consumption of sweet food and soft drinks to reduce your risk of developing diabetes.  I have included some information on how to do this.  Please discuss your memory loss with your neurologist since they are specialists in the brain and he can help you more than I can about this.  I do not have a good reason for why you are having memory loss currently unfortunately.  If you have any questions or concerns, please feel free to call the clinic.   Be well,  Dr. Frances FurbishWinfrey  Preventing Type 2 Diabetes Mellitus Type 2 diabetes (type 2 diabetes mellitus) is a long-term (chronic) disease that affects blood sugar (glucose) levels. Normally, a hormone called insulin allows glucose to enter cells in the body. The cells use glucose for energy. In type 2 diabetes, one or both of these problems may be present:  The body does not make enough insulin.  The body does not respond properly to insulin that it makes (insulin resistance). Insulin resistance or lack of insulin causes excess glucose to build up in the blood instead of going into cells. As a result, high blood glucose (hyperglycemia) develops, which can cause many complications. Being overweight or obese and having an inactive (sedentary) lifestyle can increase your risk for diabetes. Type 2 diabetes can be delayed or prevented by making certain nutrition and lifestyle changes. What nutrition changes can be made?   Eat healthy meals and snacks regularly. Keep a healthy snack with you for when you get hungry between meals, such as fruit or a handful of nuts.  Eat lean meats and proteins that are low in saturated fats, such as chicken, fish, egg whites, and beans. Avoid processed meats.  Eat plenty of fruits and vegetables and plenty of grains that have not been processed (whole grains). It is recommended that  you eat: ? 1?2 cups of fruit every day. ? 2?3 cups of vegetables every day. ? 6?8 oz of whole grains every day, such as oats, whole wheat, bulgur, brown rice, quinoa, and millet.  Eat low-fat dairy products, such as milk, yogurt, and cheese.  Eat foods that contain healthy fats, such as nuts, avocado, olive oil, and canola oil.  Drink water throughout the day. Avoid drinks that contain added sugar, such as soda or sweet tea.  Follow instructions from your health care provider about specific eating or drinking restrictions.  Control how much food you eat at a time (portion size). ? Check food labels to find out the serving sizes of foods. ? Use a kitchen scale to weigh amounts of foods.  Saute or steam food instead of frying it. Cook with water or broth instead of oils or butter.  Limit your intake of: ? Salt (sodium). Have no more than 1 tsp (2,400 mg) of sodium a day. If you have heart disease or high blood pressure, have less than ? tsp (1,500 mg) of sodium a day. ? Saturated fat. This is fat that is solid at room temperature, such as butter or fat on meat. What lifestyle changes can be made? Activity   Do moderate-intensity physical activity for at least 30 minutes on at least 5 days of the week, or as much as told by your health care provider.  Ask your health care provider what activities are safe for you. A  mix of physical activities may be best, such as walking, swimming, cycling, and strength training.  Try to add physical activity into your day. For example: ? Park in spots that are farther away than usual, so that you walk more. For example, park in a far corner of the parking lot when you go to the office or the grocery store. ? Take a walk during your lunch break. ? Use stairs instead of elevators or escalators. Weight Loss  Lose weight as directed. Your health care provider can determine how much weight loss is best for you and can help you lose weight  safely.  If you are overweight or obese, you may be instructed to lose at least 5?7 % of your body weight. Alcohol and Tobacco   Limit alcohol intake to no more than 1 drink a day for nonpregnant women and 2 drinks a day for men. One drink equals 12 oz of beer, 5 oz of wine, or 1 oz of hard liquor.  Do not use any tobacco products, such as cigarettes, chewing tobacco, and e-cigarettes. If you need help quitting, ask your health care provider. Work With Your Health Care Provider  Have your blood glucose tested regularly, as told by your health care provider.  Discuss your risk factors and how you can reduce your risk for diabetes.  Get screening tests as told by your health care provider. You may have screening tests regularly, especially if you have certain risk factors for type 2 diabetes.  Make an appointment with a diet and nutrition specialist (registered dietitian). A registered dietitian can help you make a healthy eating plan and can help you understand portion sizes and food labels. Why are these changes important?  It is possible to prevent or delay type 2 diabetes and related health problems by making lifestyle and nutrition changes.  It can be difficult to recognize signs of type 2 diabetes. The best way to avoid possible damage to your body is to take actions to prevent the disease before you develop symptoms. What can happen if changes are not made?  Your blood glucose levels may keep increasing. Having high blood glucose for a long time is dangerous. Too much glucose in your blood can damage your blood vessels, heart, kidneys, nerves, and eyes.  You may develop prediabetes or type 2 diabetes. Type 2 diabetes can lead to many chronic health problems and complications, such as: ? Heart disease. ? Stroke. ? Blindness. ? Kidney disease. ? Depression. ? Poor circulation in the feet and legs, which could lead to surgical removal (amputation) in severe cases. Where to find  support  Ask your health care provider to recommend a registered dietitian, diabetes educator, or weight loss program.  Look for local or online weight loss groups.  Join a gym, fitness club, or outdoor activity group, such as a walking club. Where to find more information To learn more about diabetes and diabetes prevention, visit:  [place] Diabetes Association (ADA): www.diabetes.[employer]  National Institute of Diabetes and Digestive and Kidney Diseases: ToyArticles.cawww.niddk.nih.gov/health-information/diabetes To learn more about healthy eating, visit:  The U.S. Department of Agriculture Architect(USDA), Choose My Plate: http://yates.biz/www.choosemyplate.gov/food-groups  Office of Disease Prevention and Health Promotion (ODPHP), Dietary Guidelines: ListingMagazine.siwww.health.gov/dietaryguidelines Summary  You can reduce your risk for type 2 diabetes by increasing your physical activity, eating healthy foods, and losing weight as directed.  Talk with your health care provider about your risk for type 2 diabetes. Ask about any blood tests or screening tests that you  need to have. This information is not intended to replace advice given to you by your health care provider. Make sure you discuss any questions you have with your health care provider. Document Released: 02/23/2016 Document Revised: 02/23/2019 Document Reviewed: 12/23/2015 Elsevier Patient Education  2020 Lauren Obrien.

## 2019-06-29 NOTE — Progress Notes (Signed)
   Subjective:    Lauren Obrien - 18 y.o. female MRN [id number]  Date of birth: 10/15/2001  CC:  Lauren Obrien is here for memory problems and difficulty urinating.  HPI:  Memory problems Says that she has short term memory issues.  This has been going on for several months, but it was thought to be due to stress and lack of sleep.  Now, she continues to have these issues even while sleeping 9 hours per night and having less stress due to not taking any classes this summer.  She is concerned that these problems have not improved due to these changes.  She would like to know if there is a memory doctor that could help her to address this issue.  Urinary difficulties Has difficulty starting to urinate and will feel like she needs to pee soon after urinating.  After she relaxes for a while, she will usually be able to initiate urination.  Denies dysuria.  Health Maintenance: Health Maintenance Due  Topic Date Due  . INFLUENZA VACCINE  06/16/2019    -  reports that she has never smoked. She has never used smokeless tobacco. - Review of Systems: Per HPI. - Past Medical History: Patient Active Problem List   Diagnosis Date Noted  . Urinary urgency 07/01/2019  . Memory difficulties 07/01/2019  . Chronic tension type headache 05/29/2019  . Acanthosis nigricans 05/29/2019  . Plantar fasciitis 05/14/2019  . 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 Hidradenitis suppurativa 03/04/2019  . Carpal tunnel syndrome of left wrist 01/04/2019  . Blurry vision, bilateral 12/16/2018  . Learning difficulty 12/16/2018  . Left wrist tendinitis 12/01/2018  . Sleep deprivation 09/28/2018  . Recurrent boils 09/21/2018  . Irregular menstrual bleeding 04/03/2018  . Acne vulgaris 04/03/2018  . Allergic rhinitis 09/23/2015   - Medications: reviewed and updated   Objective:   Physical Exam BP 118/68   Pulse 95   Wt 299 lb (135.6 kg)   SpO2 98%   BMI 48.26 kg/m  Gen: NAD,  alert, cooperative with exam, well-appearing, pleasant, obese CV: RRR, good S1/S2, no murmur Resp: CTABL, no wheezes, non-labored  Assessment & Plan:   Urinary urgency UA is normal today.  This could possibly be psychological.  Polyuria is not due to diabetes, since recent A1c was 5.7.  Patient was reassured that her symptoms could be due to drinking more water or certain stressors.  Memory difficulties Although patient is not currently taking classes and is getting more sleep now, she continues to have a stressful social situation due to being a young mother and having a history of homelessness and immigrant parents who do not speak [place].  Encouraged her to discuss this with her neurologist and told her that an MRI of her brain is being scheduled by her neurologist, which is the best picture we have of the brain.  I think her memory difficulties are likely psychological due to her stressors.  Congratulated patient again on pursuing further education after high school.  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    Maia Breslow, M.D. 07/01/2019, 9:44 AM PGY-3, [place]

## 2019-07-01 DIAGNOSIS — R413 Other amnesia: Secondary | ICD-10-CM | POA: Insufficient documentation

## 2019-07-01 DIAGNOSIS — R3915 Urgency of urination: Secondary | ICD-10-CM | POA: Insufficient documentation

## 2019-07-01 NOTE — Assessment & Plan Note (Signed)
UA is normal today.  This could possibly be psychological.  Polyuria is not due to diabetes, since recent A1c was 5.7.  Patient was reassured that her symptoms could be due to drinking more water or certain stressors.

## 2019-07-01 NOTE — Assessment & Plan Note (Signed)
Although patient is not currently taking classes and is getting more sleep now, she continues to have a stressful social situation due to being a young mother and having a history of homelessness and immigrant parents who do not speak [place].  Encouraged her to discuss this with her neurologist and told her that an MRI of her brain is being scheduled by her neurologist, which is the best picture we have of the brain.  I think her memory difficulties are likely psychological due to her stressors.  Congratulated patient again on pursuing further education after high school.  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

## 2019-07-02 ENCOUNTER — Telehealth: Payer: Self-pay | Admitting: *Deleted

## 2019-07-02 DIAGNOSIS — R200 Anesthesia of skin: Secondary | ICD-10-CM

## 2019-07-02 NOTE — Telephone Encounter (Signed)
Labs ok; minor abnl (microcytosis). Will check MRI scans. -VRP

## 2019-07-03 ENCOUNTER — Telehealth: Payer: Self-pay | Admitting: Diagnostic Neuroimaging

## 2019-07-03 NOTE — Telephone Encounter (Signed)
Spoke with patient and informed her labs are okay, minor microcytosis; she can discuss with her PCP at next visit. I advised her Dr Leta Baptist ordered MRI of brain and cervical spine. She will get a call to schedule after insurance authorization is completed. She verbalized understanding, appreciation.

## 2019-07-03 NOTE — Telephone Encounter (Signed)
Medicaid order sent to GI. They will obtain the auth and reach out to the patient to schedule.  

## 2019-07-04 ENCOUNTER — Telehealth: Payer: Self-pay | Admitting: *Deleted

## 2019-07-04 NOTE — Telephone Encounter (Signed)
Spoke with patient and informed her that her lab results are unremarkable. She is aware of MRIs ordered. She verbalized understanding, appreciation.

## 2019-07-04 NOTE — Telephone Encounter (Signed)
LVM with number requesting call back for lab results and message.

## 2019-07-16 ENCOUNTER — Other Ambulatory Visit: Payer: Self-pay

## 2019-07-16 ENCOUNTER — Telehealth: Payer: Self-pay | Admitting: Family Medicine

## 2019-07-16 ENCOUNTER — Telehealth (INDEPENDENT_AMBULATORY_CARE_PROVIDER_SITE_OTHER): Payer: Medicaid Other | Admitting: Family Medicine

## 2019-07-16 ENCOUNTER — Other Ambulatory Visit: Payer: Medicaid Other

## 2019-07-16 DIAGNOSIS — R079 Chest pain, unspecified: Secondary | ICD-10-CM

## 2019-07-16 NOTE — Progress Notes (Signed)
[facility] Tele[facility]icine Visit  Patient consented to have virtual visit. Method of visit: Video  Encounter participants: Patient: Lauren Obrien - located at home in [facility] Provider: Nuala Alpha - located at [facility] Others (if applicable): none  Chief Complaint: left sided chest pain  HPI: Patient states she has left sided chest pain that has been going on for "a long time". It feels like an electric shock for a little bit and then it will go away in less than a minute. Last experienced this last Thursday located at the left side of her sternum which is made worse on deep inspiration. She also some difficulty breathing during this most recent episode which she attributed to the pain. This most recent episode lasted 3 hours. It is a non-radiating pain that stays on the left side of her chest; does not go to the arm, jaw, or to the back. She thinks it seems to occur more often when she goes up the stairs. She is not really sure when she gets the pain. She is a non-smoker, has an IUD for birth control, no personal history of blood clots or family history of DVT/PE. Not currently having the pain now, frequency of the pain is once every month or two.  She denies any fever, chills, abdominal pain, nausea, vomiting, diarrhea, or constipation.   ROS: per HPI  Pertinent PMHx: None  Exam:  Gen: NAD, well-appearing Respiratory: NWOB, speaking normally in full sentences Skin: no rashes  Assessment/Plan:  Chest pain Atypical chest pain in a female with no known risk factors (no documented HLD or T2DM or smoking) so she would be low risk for cardiac disease. Differential includes PE vs Costochondritis vs Pleurisy vs Pericarditis. She has an IUD for birth control. Most likely some type of MSK pain but unclear. Her pain pattern and intensity does not fit a pericarditis feature and has been long term. I am concerned about a PE given she had an episode of SOB and pain with deep  insipration. She is not having the pain now and no SOB or difficulty breathing now - Come in today for d-dimer; if elevated will need to send for a CTA of the chest to rule out PE - If d-dimer negative could try 400mg -600mg  Ibuprofen or 500mg  BID of Naproxen for 10-14 days for suspected costochondritis    Time spent during visit with patient: >15 minutes  Harolyn Rutherford, DO [place], PGY-3

## 2019-07-16 NOTE — Assessment & Plan Note (Signed)
Atypical chest pain in a female with no known risk factors (no documented HLD or T2DM or smoking) so she would be low risk for cardiac disease. Differential includes PE vs Costochondritis vs Pleurisy vs Pericarditis. She has an IUD for birth control. Most likely some type of MSK pain but unclear. Her pain pattern and intensity does not fit a pericarditis feature and has been long term. I am concerned about a PE given she had an episode of SOB and pain with deep insipration. She is not having the pain now and no SOB or difficulty breathing now - Come in today for d-dimer; if elevated will need to send for a CTA of the chest to rule out PE - If d-dimer negative could try 400mg -600mg  Ibuprofen or 500mg  BID of Naproxen for 10-14 days for suspected costochondritis

## 2019-07-16 NOTE — Telephone Encounter (Signed)
**  After Hours/ Emergency Line Call**  Received a call from [facility] to report that Lauren Obrien's STAT d-dimer was 0.48 (within normal limits). Called patient to inform her of this Will forward to PCP.      Bonnita Hollow, MD PGY-3, [facility] 07/16/2019 10:27 PM

## 2019-07-17 LAB — D-DIMER, QUANTITATIVE: D-DIMER: 0.48 mg/L FEU (ref 0.00–0.49)

## 2019-07-24 ENCOUNTER — Telehealth: Payer: Self-pay | Admitting: Family Medicine

## 2019-08-01 ENCOUNTER — Telehealth: Payer: Self-pay | Admitting: Family Medicine

## 2019-08-01 ENCOUNTER — Other Ambulatory Visit: Payer: Self-pay | Admitting: *Deleted

## 2019-08-01 MED ORDER — FLUTICASONE PROPIONATE 50 MCG/ACT NA SUSP: 2.0000 | Freq: Every day | NASAL | 6 refills | Status: DC

## 2019-08-01 NOTE — Telephone Encounter (Signed)
Pt would like to know if she can have a refill of the her nose spray.

## 2019-08-01 NOTE — Telephone Encounter (Signed)
I refilled it today.  Thanks.

## 2019-08-04 ENCOUNTER — Inpatient Hospital Stay: Admission: RE | Admit: 2019-08-04 | Payer: Medicaid Other | Source: Ambulatory Visit

## 2019-08-04 ENCOUNTER — Other Ambulatory Visit: Payer: Medicaid Other

## 2019-08-05 ENCOUNTER — Other Ambulatory Visit: Payer: Self-pay | Admitting: Family Medicine

## 2019-08-06 ENCOUNTER — Other Ambulatory Visit: Payer: Self-pay | Admitting: Family Medicine

## 2019-08-06 ENCOUNTER — Other Ambulatory Visit: Payer: Self-pay

## 2019-08-06 NOTE — Telephone Encounter (Signed)
Please fill the Ibuprofen 600 mg to [facility] on [facility].  (Unless, she would like to get 800 mg)

## 2019-08-07 ENCOUNTER — Other Ambulatory Visit: Payer: Self-pay | Admitting: Family Medicine

## 2019-08-07 NOTE — Telephone Encounter (Signed)
Ibuprofen sent.  Thanks.

## 2019-08-08 NOTE — Telephone Encounter (Signed)
Pt informed of below.April Zimmerman Rumple, CMA ? ?

## 2019-08-21 ENCOUNTER — Other Ambulatory Visit: Payer: Self-pay

## 2019-08-21 ENCOUNTER — Ambulatory Visit (INDEPENDENT_AMBULATORY_CARE_PROVIDER_SITE_OTHER): Payer: Medicaid Other | Admitting: Family Medicine

## 2019-08-21 ENCOUNTER — Encounter: Payer: Self-pay | Admitting: Family Medicine

## 2019-08-21 VITALS — BP 122/74 | HR 82 | Ht 65.0 in | Wt 293.4 lb

## 2019-08-21 DIAGNOSIS — Z3200 Encounter for pregnancy test, result unknown: Secondary | ICD-10-CM

## 2019-08-21 DIAGNOSIS — K219 Gastro-esophageal reflux disease without esophagitis: Secondary | ICD-10-CM | POA: Diagnosis not present

## 2019-08-21 DIAGNOSIS — R42 Dizziness and giddiness: Secondary | ICD-10-CM | POA: Diagnosis not present

## 2019-08-21 DIAGNOSIS — Z3202 Encounter for pregnancy test, result negative: Secondary | ICD-10-CM

## 2019-08-21 LAB — POCT URINE PREGNANCY: Preg Test, Ur: NEGATIVE

## 2019-08-21 LAB — POCT HEMOGLOBIN: Hemoglobin: 12.1 g/dL (ref 11–14.6)

## 2019-08-21 MED ORDER — SALINE SPRAY 0.65 % NA SOLN: 1.0000 | NASAL | 1 refills | Status: DC | PRN

## 2019-08-21 MED ORDER — FLUTICASONE PROPIONATE 50 MCG/ACT NA SUSP: 2.0000 | Freq: Every day | NASAL | 6 refills | Status: DC

## 2019-08-21 MED ORDER — MECLIZINE HCL 12.5 MG PO TABS: 12.5000 mg | ORAL_TABLET | Freq: Three times a day (TID) | ORAL | 0 refills | Status: DC | PRN

## 2019-08-21 MED ORDER — CALCIUM CARBONATE ANTACID 500 MG PO CHEW: 1.0000 | CHEWABLE_TABLET | Freq: Two times a day (BID) | ORAL | 1 refills | Status: DC

## 2019-08-21 MED ORDER — CETIRIZINE HCL 10 MG PO TABS: 10.0000 mg | ORAL_TABLET | Freq: Every day | ORAL | 11 refills | Status: DC

## 2019-08-21 NOTE — Patient Instructions (Addendum)
Thank you for coming to see me today. It was a pleasure! Today we talked about:   For your heart burn, please try tums.  For your dizziness I have sent meclizine to your pharmacy.  You can use this up to 3 times per day as needed.  I also recommend trialing an antihistamine, Zyrtec as postnasal drip could be causing your throat symptoms as well as your dizziness.  You may also trial Flonase.  I have sent both of these to your pharmacy.  Please be sure to follow-up with Dr. Shan Levans in 1 week in order to discuss if these things have helped or not.  At that time she may need to evaluate you further for vertigo.  Your dizziness is not due to low blood pressure or low blood count  Please follow-up as needed.  If you have any questions or concerns, please do not hesitate to call the office at [phone].  Take Care,   Martinique Girtha Kilgore, DO

## 2019-08-21 NOTE — Progress Notes (Signed)
Subjective:  Patient ID: Lauren Obrien  DOB: 2001-04-05 MRN: [id number]  Lauren Obrien is a 18 y.o. female with a PMH of hidradenitis suppurativa, prediabetes, here today for dizziness.   HPI:  Dizzy: -Patient reports that she was diagnosed with prediabetes at her last visit with Dr. Frances Furbish.  She states that since then she has noticed some intermittent feelings of dizziness.  States it has been worse over the last 3 days -Patient reports that her dizziness is worse when she is standing up and she will go lay down and will feel better.  She has no associated headaches with this. -She states that she feels that sometimes the room is spinning.  When she closes her eyes she will sometimes feel dizzy.  She does not notice it being worse in the morning.  She does not notice it affecting her balance.  She had a negative pregnancy test at home.  Acid reflux -Patient reports that she has been having this nasty feeling in the back of her throat after she is eating for the past 3 days.  She feels like food is coming up.  She reports that she has had a history of heartburn but is sometimes having symptoms that are consistent with her previous symptoms of heartburn.  She has not tried any medication for this.  She notes that it is worse when she lies down if she has just eaten.  She does think that some foods are making it worse.  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   Hidradenitis suppurativa 03/04/2019   Carpal tunnel syndrome of left wrist 01/04/2019   Blurry vision, bilateral 12/16/2018   Learning difficulty 12/16/2018   Left wrist tendinitis  12/01/2018   Recurrent boils 09/21/2018   Irregular menstrual bleeding 04/03/2018   Acne vulgaris 04/03/2018   Acid reflux 01/21/2016   Chest pain 09/23/2015   Allergic rhinitis 09/23/2015     Objective:  BP 122/74    Pulse 82    Ht 5\' 5"  (1.651 m)    Wt 293 lb 6 oz (133.1 kg)    SpO2 97%    BMI 48.82 kg/m   Vitals and nursing note reviewed  General: NAD, pleasant Pulm: normal effort Extremities: no edema or cyanosis. WWP. Skin: warm and dry, no rashes noted Neuro: alert and oriented, no focal deficits Psych: normal affect, normal thought content  Assessment & Plan:   Dizziness Orthostatics negative in office.  No concern for cardiac etiology given history.  No other red flags noted. Could be vertigo but less likely also given history, unable to perform Epley maneuver in clinic given time constraints.  Patient does report that she is having some postnasal drip as well as pressure in the top of her face.  Could be that this is related to her allergies.  Will try antihistamine in addition to the nasal spray that she has at home.  She has follow-up appointment on 10/13 with her PCP in order to determine if this has resolved.  Acid reflux Patient's symptoms appear to be consistent with GERD given the reflux that is occurring.  Patient is high risk with her obesity as well as the  diet.  Discussed her need to trial Tums after she eats.  She has previously tried Pepcid which she is unsure if this completely helped her symptoms.  Advised that she could also try this over-the-counter.  Patient counseled that daily exercise and weight loss will help with her symptoms of acid reflux.  However if her symptoms persist she is a good candidate for testing of H. pylori gastritis.  She has follow-up with her PCP on 10/13 and will let PCP know at that time if her symptoms have resolved or if she needs further testing for H. pylori.   Martinique Sorina Derrig, DO Family Medicine Resident PGY-3

## 2019-08-23 DIAGNOSIS — R42 Dizziness and giddiness: Secondary | ICD-10-CM | POA: Insufficient documentation

## 2019-08-23 HISTORY — DX: Dizziness and giddiness: R42

## 2019-08-23 NOTE — Assessment & Plan Note (Signed)
Patient's symptoms appear to be consistent with GERD given the reflux that is occurring.  Patient is high risk with her obesity as well as the diet.  Discussed her need to trial Tums after she eats.  She has previously tried Pepcid which she is unsure if this completely helped her symptoms.  Advised that she could also try this over-the-counter.  Patient counseled that daily exercise and weight loss will help with her symptoms of acid reflux.  However if her symptoms persist she is a good candidate for testing of H. pylori gastritis.  She has follow-up with her PCP on 10/13 and will let PCP know at that time if her symptoms have resolved or if she needs further testing for H. pylori.

## 2019-08-23 NOTE — Assessment & Plan Note (Signed)
Orthostatics negative in office.  No concern for cardiac etiology given history.  No other red flags noted. Could be vertigo but less likely also given history, unable to perform Epley maneuver in clinic given time constraints.  Patient does report that she is having some postnasal drip as well as pressure in the top of her face.  Could be that this is related to her allergies.  Will try antihistamine in addition to the nasal spray that she has at home.  She has follow-up appointment on 10/13 with her PCP in order to determine if this has resolved.

## 2019-08-24 ENCOUNTER — Ambulatory Visit (INDEPENDENT_AMBULATORY_CARE_PROVIDER_SITE_OTHER): Payer: Medicaid Other

## 2019-08-24 ENCOUNTER — Other Ambulatory Visit: Payer: Self-pay

## 2019-08-24 DIAGNOSIS — Z23 Encounter for immunization: Secondary | ICD-10-CM

## 2019-08-28 ENCOUNTER — Encounter: Payer: Self-pay | Admitting: Family Medicine

## 2019-08-28 ENCOUNTER — Other Ambulatory Visit: Payer: Self-pay

## 2019-08-28 ENCOUNTER — Ambulatory Visit (INDEPENDENT_AMBULATORY_CARE_PROVIDER_SITE_OTHER): Payer: Medicaid Other | Admitting: Family Medicine

## 2019-08-28 VITALS — BP 104/64 | HR 85 | Wt 292.0 lb

## 2019-08-28 DIAGNOSIS — L739 Follicular disorder, unspecified: Secondary | ICD-10-CM | POA: Diagnosis not present

## 2019-08-28 DIAGNOSIS — R7303 Prediabetes: Secondary | ICD-10-CM | POA: Diagnosis not present

## 2019-08-28 DIAGNOSIS — L700 Acne vulgaris: Secondary | ICD-10-CM

## 2019-08-28 MED ORDER — MUPIROCIN CALCIUM 2 % EX CREA: 1.0000 "application " | TOPICAL_CREAM | Freq: Two times a day (BID) | CUTANEOUS | 2 refills | Status: DC

## 2019-08-28 NOTE — Progress Notes (Signed)
Subjective:    Lauren Obrien - 18 y.o. female MRN [id number]  Date of birth: 10-14-2001  CC:  Rosemond Lyttle is here to discuss her vulvar bumps and facial acne as well as her prediabetes.  HPI: Vulvar bumps Located on mons pubis Do not seem to occur more frequently when she shaves, although she does shave frequently Nonpruritic but can be painful Do not drain Have been occurring for the past year or 2 but did not occur before then  Acne Has struggled with cystic and comedonal acne since puberty Involves patient's chest as well Has tried doxycycline, clarithromycin, benzyl peroxide gel, topical clindamycin and clindamycin/benzoyl peroxide, but none of these methods have been very effective  Prediabetes Wants to make sure that she does not develop type 2 diabetes Wonders if her prediabetes contributes to her feelings of fatigue Understands that her diet and inactivity have contributed to her diagnosis of prediabetes and would like to improve her diet  Health Maintenance:  There are no preventive care reminders to display for this patient.  -  reports that she has never smoked. She has never used smokeless tobacco. - Review of Systems: Per HPI. - Past Medical History: Patient Active Problem List   Diagnosis Date Noted  . Folliculitis 08/21/1218  . Prediabetes 08/29/2019  . Dizziness 08/23/2019  . Memory difficulties 07/01/2019  . Chronic tension type headache 05/29/2019  . Acanthosis nigricans 05/29/2019  . Plantar fasciitis 05/14/2019  . 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 Hidradenitis suppurativa 03/04/2019  . Carpal tunnel syndrome of left wrist 01/04/2019  . Blurry vision, bilateral 12/16/2018  . Learning difficulty 12/16/2018  . Left wrist tendinitis 12/01/2018  . Recurrent boils 09/21/2018  . Irregular menstrual bleeding 04/03/2018  . Acne vulgaris 04/03/2018  . Acid reflux 01/21/2016  . Allergic rhinitis 09/23/2015   - Medications:  reviewed and updated   Objective:   Physical Exam BP 104/64   Pulse 85   Wt 292 lb (132.5 kg)   SpO2 99%   BMI 48.59 kg/m  Gen: NAD, alert, cooperative with exam, well-appearing, obese CV: RRR, good S1/S2, no murmur, no edema Resp: CTABL, no wheezes, non-labored Skin: Combination of cystic and comedonal acne throughout patient's face with scarring Psych: good insight, alert and oriented  Assessment & Plan:   Folliculitis Although patient's pubic area was not examined today, she did show me pictures, which show erythematous, small furuncles that appear to be consistent with folliculitis, especially given her history of shaving.  Recommended that patient abstain from shaving or use a hair trimmer instead and gave her pictures of options that could work.  Also sent mupirocin ointment that she apply to her bumps if needed  Acne vulgaris Given patient's cystic and widespread acne as well as failure of a myriad of more conservative medications, discussed the possibility of starting Accutane if dermatology deems this an appropriate medication for her.  Warned patient that she must not become pregnant while on this medication due to danger to the fetus, but patient does have Mirena in place.  Placed referral to dermatology today.  Prediabetes Discussed with patient that her A1c was 5.7, which puts her at the very low end of the prediabetic range.  Discussed that it is unlikely that this is the reason for her fatigue since many factors can cause this symptom.  Did discuss the importance of reducing consumption of processed foods and increasing consumption of fruits and vegetables and increasing activity level.  We agreed  that she would start a food journal and bring it into her next visit with me, which should be in about 1 to 2 months.    Maia Breslow, M.D. 08/29/2019, 9:00 AM PGY-3, [facility]

## 2019-08-28 NOTE — Patient Instructions (Addendum)
It was nice seeing you today Rhodie!  I have included a picture of the trimmer you should try that may help you have fewer bumps in your vulvar area.  I have sent an antibacterial cream that you can use when you have a bump as well.  I am referring you to dermatology to see if they can give you something stronger for your acne.  You would be a good candidate for this.  I am giving you the card to our nutritionist and giving you a handout on healthy eating habits to incorporate to reduce your risk of developing diabetes.  Please make a food journal and bring it into me the next time that you see me.  You can follow-up in 1 to 2 months.  If you have any questions or concerns, please feel free to call the clinic.   Be well,  Dr. Shan Levans

## 2019-08-29 DIAGNOSIS — L739 Follicular disorder, unspecified: Secondary | ICD-10-CM | POA: Insufficient documentation

## 2019-08-29 DIAGNOSIS — R7303 Prediabetes: Secondary | ICD-10-CM | POA: Insufficient documentation

## 2019-08-29 HISTORY — DX: Follicular disorder, unspecified: L73.9

## 2019-08-29 NOTE — Assessment & Plan Note (Signed)
Given patient's cystic and widespread acne as well as failure of a myriad of more conservative medications, discussed the possibility of starting Accutane if dermatology deems this an appropriate medication for her.  Warned patient that she must not become pregnant while on this medication due to danger to the fetus, but patient does have Mirena in place.  Placed referral to dermatology today.

## 2019-08-29 NOTE — Assessment & Plan Note (Addendum)
Although patient's pubic area was not examined today, she did show me pictures, which show erythematous, small furuncles that appear to be consistent with folliculitis, especially given her history of shaving.  Recommended that patient abstain from shaving or use a hair trimmer instead and gave her pictures of options that could work.  Also sent mupirocin ointment that she apply to her bumps if needed

## 2019-08-29 NOTE — Assessment & Plan Note (Signed)
Discussed with patient that her A1c was 5.7, which puts her at the very low end of the prediabetic range.  Discussed that it is unlikely that this is the reason for her fatigue since many factors can cause this symptom.  Did discuss the importance of reducing consumption of processed foods and increasing consumption of fruits and vegetables and increasing activity level.  We agreed that she would start a food journal and bring it into her next visit with me, which should be in about 1 to 2 months.

## 2019-08-31 ENCOUNTER — Ambulatory Visit
Admission: RE | Admit: 2019-08-31 | Discharge: 2019-08-31 | Disposition: A | Discharge status: Home / Self Care | Payer: Medicaid Other | Source: Ambulatory Visit | Attending: Diagnostic Neuroimaging | Admitting: Diagnostic Neuroimaging

## 2019-08-31 DIAGNOSIS — R200 Anesthesia of skin: Secondary | ICD-10-CM | POA: Diagnosis not present

## 2019-08-31 MED ORDER — GADOBENATE DIMEGLUMINE 529 MG/ML IV SOLN
20.0000 mL | Freq: Once | INTRAVENOUS | Status: AC | PRN
  Administered 2019-08-31: 20 mL via INTRAVENOUS

## 2019-09-04 ENCOUNTER — Telehealth: Payer: Self-pay | Admitting: *Deleted

## 2019-09-04 ENCOUNTER — Telehealth: Payer: Self-pay | Admitting: Diagnostic Neuroimaging

## 2019-09-04 DIAGNOSIS — R200 Anesthesia of skin: Secondary | ICD-10-CM

## 2019-09-04 NOTE — Telephone Encounter (Signed)
Returned call to [facility].  Someone answered and hung up x 2.  Will await callback. Christen Bame, CMA

## 2019-09-04 NOTE — Telephone Encounter (Signed)
PT call back about wanting results on her MRI cervical spine and MR brain results. She is still having pain and numbness in left arm.She wanted to know if she needed to make a follow up appt. I stated message will be sent to nurse.

## 2019-09-10 ENCOUNTER — Telehealth: Payer: Self-pay | Admitting: Diagnostic Neuroimaging

## 2019-09-10 NOTE — Telephone Encounter (Signed)
I called patient and LVm to schedule NCV/EMG with Dr. Leta Baptist.

## 2019-09-10 NOTE — Telephone Encounter (Signed)
Spoke with patient and informed her MRI brain result is unremarkable and MRI cervical spine result is normal. She asked what next steps are, and I advised her Dr Gladstone Lighter note stated he may consider NCS based on her symptoms. She stated her left arm is still numb and painful at times. She asked again what are next steps, and I stated will discuss with Dr Leta Baptist. If she orders NCS she'll get a call to schedule. She verbalized understanding, appreciation.

## 2019-09-10 NOTE — Telephone Encounter (Signed)
Orders Placed This Encounter  Procedures  . NCV with EMG(electromyography)    Standing Status:   Future    Standing Expiration Date:   09/09/2020    Scheduling Instructions:     Left >  Right Upper ext numbness    Order Specific Question:   Where should this test be performed?    Answer:   Janalyn Shy, MD 16/57/9038, 33:38 AM Certified in Neurology, Neurophysiology and Neuroimaging  [facility] [address] [place], [place] [address] [phone]

## 2019-09-10 NOTE — Addendum Note (Signed)
Addended by: Andrey Spearman R on: 09/10/2019 10:50 AM   Modules accepted: Orders

## 2019-09-12 ENCOUNTER — Telehealth: Payer: Self-pay

## 2019-09-12 NOTE — Telephone Encounter (Signed)
Pharmacy calls nurse line stating Bactroban 2% cream is not covered by her insurance, however the ointment is. Ok to change?

## 2019-09-13 MED ORDER — MUPIROCIN 2 % EX OINT: 1.0000 "application " | TOPICAL_OINTMENT | Freq: Two times a day (BID) | CUTANEOUS | 0 refills | Status: DC

## 2019-09-13 NOTE — Telephone Encounter (Signed)
Changed script to ointment. Covering for Dr. Shan Levans.

## 2019-10-03 ENCOUNTER — Telehealth: Payer: Self-pay | Admitting: Family Medicine

## 2019-10-03 DIAGNOSIS — H04123 Dry eye syndrome of bilateral lacrimal glands: Secondary | ICD-10-CM | POA: Diagnosis not present

## 2019-10-03 NOTE — Telephone Encounter (Signed)
Patient had a letter sent to [place] to get her sugar level from her last visit but has not heard back.  Can we call her today or asap to give her this information.  [phone].

## 2019-10-04 ENCOUNTER — Ambulatory Visit: Payer: Medicaid Other | Admitting: Diagnostic Neuroimaging

## 2019-10-04 ENCOUNTER — Other Ambulatory Visit: Payer: Self-pay

## 2019-10-04 ENCOUNTER — Encounter (INDEPENDENT_AMBULATORY_CARE_PROVIDER_SITE_OTHER): Payer: Medicaid Other | Admitting: Diagnostic Neuroimaging

## 2019-10-04 DIAGNOSIS — R200 Anesthesia of skin: Secondary | ICD-10-CM

## 2019-10-04 DIAGNOSIS — Z0289 Encounter for other administrative examinations: Secondary | ICD-10-CM

## 2019-10-05 ENCOUNTER — Encounter: Payer: Self-pay | Admitting: Family Medicine

## 2019-10-05 NOTE — Telephone Encounter (Signed)
Pt informed of below.Lauren Obrien, CMA ? ?

## 2019-10-05 NOTE — Telephone Encounter (Signed)
Please let Lauren Obrien know that I have sent her eye doctor a letter in response to his letter regarding their visit.  I let him know about her blood sugar level and Hemoglobin A1c.  Thanks.

## 2019-10-09 NOTE — Procedures (Signed)
[facility]  NCS (NERVE CONDUCTION STUDY) WITH EMG (ELECTROMYOGRAPHY) REPORT   STUDY DATE: 10/09/19 PATIENT NAME: Lauren Obrien DOB: [birth year] MRN: [id number]  ORDERING CLINICIAN: Joycelyn Schmid, MD   TECHNOLOGIST: Durenda Age ELECTROMYOGRAPHER: Glenford Bayley. Aniah Pauli, MD  CLINICAL INFORMATION: 18 year old female with left wrist numbness and pain.  FINDINGS: NERVE CONDUCTION STUDY: Bilateral median and ulnar motor responses are normal.  Bilateral median and ulnar sensory responses are normal.  Bilateral median to ulnar transcarpal mixed nerve comparisons are normal.  Bilateral ulnar F-wave latencies are normal.   NEEDLE ELECTROMYOGRAPHY:  Needle examination of left upper extremity deltoid, biceps, triceps, flexor carpi radialis, first dorsal interosseous is normal.   IMPRESSION:   Normal study.  No electrodiagnostic evidence of large fiber neuropathy at this time.   INTERPRETING PHYSICIAN:  Suanne Marker, MD Certified in Neurology, Neurophysiology and Neuroimaging  [facility] [address] [place], [place] [address] [phone]  [facility]    Nerve / Sites Muscle Latency Ref. Amplitude Ref. Rel Amp Segments Distance Velocity Ref. Area    ms ms mV mV %  cm m/s m/s mVms  R Median - APB     Wrist APB 3.0 ?4.4 9.7 ?4.0 100 Wrist - APB 7   38.0     Upper arm APB 6.4  9.6  98.8 Upper arm - Wrist 21 62 ?49 37.7  L Median - APB     Wrist APB 2.8 ?4.4 8.7 ?4.0 100 Wrist - APB 7   38.7     Upper arm APB 6.3  8.5  97.9 Upper arm - Wrist 21 61 ?49 37.5  R Ulnar - ADM     Wrist ADM 2.1 ?3.3 11.0 ?6.0 100 Wrist - ADM 7   39.0     B.Elbow ADM 5.5  10.8  98.5 B.Elbow - Wrist 20 60 ?49 39.1     A.Elbow ADM 7.0  10.9  101 A.Elbow - B.Elbow 10 64 ?49 38.8         A.Elbow - Wrist      L Ulnar - ADM     Wrist ADM 2.4 ?3.3 10.3 ?6.0 100 Wrist - ADM 7   35.8     B.Elbow ADM 5.6  10.3  101 B.Elbow - Wrist 20 64 ?49 36.5     A.Elbow  ADM 7.1  10.4  101 A.Elbow - B.Elbow 10 64 ?49 35.9         A.Elbow - Wrist                 SNC    Nerve / Sites Rec. Site Peak Lat Ref.  Amp Ref. Segments Distance Peak Diff Ref.    ms ms V V  cm ms ms  R Median, Ulnar - Transcarpal comparison     Median Palm Wrist 1.8 ?2.2 103 ?35 Median Palm - Wrist 8       Ulnar Palm Wrist 1.8 ?2.2 21 ?12 Ulnar Palm - Wrist 8          Median Palm - Ulnar Palm  0.0 ?0.4  L Median, Ulnar - Transcarpal comparison     Median Palm Wrist 1.8 ?2.2 102 ?35 Median Palm - Wrist 8       Ulnar Palm Wrist 1.9 ?2.2 19 ?12 Ulnar Palm - Wrist 8          Median Palm - Ulnar Palm  -0.1 ?0.4  R Median - Orthodromic (Dig II, Mid  palm)     Dig II Wrist 2.6 ?3.4 22 ?10 Dig II - Wrist 13    L Median - Orthodromic (Dig II, Mid palm)     Dig II Wrist 2.7 ?3.4 22 ?10 Dig II - Wrist 13    R Ulnar - Orthodromic, (Dig V, Mid palm)     Dig V Wrist 2.7 ?3.1 6 ?5 Dig V - Wrist 11    L Ulnar - Orthodromic, (Dig V, Mid palm)     Dig V Wrist 2.7 ?3.1 7 ?5 Dig V - Wrist 67                   F  Wave    Nerve F Lat Ref.   ms ms  R Ulnar - ADM 24.9 ?32.0  L Ulnar - ADM 24.8 ?32.0         EMG full       EMG Summary Table    Spontaneous MUAP Recruitment  Muscle IA Fib PSW Fasc Other Amp Dur. Poly Pattern  L. Deltoid Normal None None None _______ Normal Normal Normal Normal  L. Biceps brachii Normal None None None _______ Normal Normal Normal Normal  L. Triceps brachii Normal None None None _______ Normal Normal Normal Normal  L. Flexor carpi radialis Normal None None None _______ Normal Normal Normal Normal  L. First dorsal interosseous Normal None None None _______ Normal Normal Normal Normal

## 2019-10-18 ENCOUNTER — Ambulatory Visit (INDEPENDENT_AMBULATORY_CARE_PROVIDER_SITE_OTHER): Payer: Medicaid Other | Admitting: Family Medicine

## 2019-10-18 ENCOUNTER — Encounter: Payer: Self-pay | Admitting: Family Medicine

## 2019-10-18 ENCOUNTER — Other Ambulatory Visit: Payer: Self-pay

## 2019-10-18 VITALS — BP 110/64 | HR 87 | Wt 296.6 lb

## 2019-10-18 DIAGNOSIS — Z72820 Sleep deprivation: Secondary | ICD-10-CM | POA: Diagnosis not present

## 2019-10-18 DIAGNOSIS — G5602 Carpal tunnel syndrome, left upper limb: Secondary | ICD-10-CM

## 2019-10-18 DIAGNOSIS — Z9189 Other specified personal risk factors, not elsewhere classified: Secondary | ICD-10-CM | POA: Diagnosis not present

## 2019-10-18 NOTE — Progress Notes (Signed)
Subjective:    Lauren Obrien - 18 y.o. female MRN [id number]  Date of birth: [birth year]  CC:  [name] is here for eye heaviness, L arm pain, and sleep difficulties.  HPI: Eyes heaviness Has been to the eye doctor for this complaint and was given a set of drops which didn't work, but a second set has been more effective.  She thinks this may be due to her sleep deprivation.  She wonders if this could be due to high blood sugar as well.  L arm pain Has occurred for many months Worse when typing Left wrist and hand are affected more than forearm and arm Has stopped wearing the arm brace at night because the pressure of the brace made her arm hurt worse EMG studies were within normal limits  Sleep Sleeping 7-8 hours per night currently Will get headaches sometimes at night Will get sleepy in the middle of the day Has been told that she snores Wakes up fatigued   Health Maintenance:  There are no preventive care reminders to display for this patient.  -  reports that she has never smoked. She has never used smokeless tobacco. - Review of Systems: Per HPI. - Past Medical History: Patient Active Problem List   Diagnosis Date Noted  . Folliculitis 01/74/9449  . Prediabetes 08/29/2019  . Dizziness 08/23/2019  . Memory difficulties 07/01/2019  . Chronic tension type headache 05/29/2019  . Acanthosis nigricans 05/29/2019  . Plantar fasciitis 05/14/2019  . 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 Hidradenitis suppurativa 03/04/2019  . Carpal tunnel syndrome of left wrist 01/04/2019  . Blurry vision, bilateral 12/16/2018  . Learning difficulty 12/16/2018  . Left wrist tendinitis 12/01/2018  . Sleep deprivation 09/28/2018  . Recurrent boils 09/21/2018  . Irregular menstrual bleeding 04/03/2018  . Acne vulgaris 04/03/2018  . Acid reflux 01/21/2016  . Allergic rhinitis 09/23/2015   - Medications: reviewed and updated   Objective:   Physical Exam  BP 110/64   Pulse 87   Wt 296 lb 9.6 oz (134.5 kg)   SpO2 100%   BMI 49.36 kg/m  Gen: NAD, alert, cooperative with exam, well-appearing, obese Neuro: no gross deficits.  Tinel sign negative, although Phalen sign is positive on the left.  No gross visual deformity of the left hand or arm. Psych: good insight, alert and oriented        Assessment & Plan:   Carpal tunnel syndrome of left wrist Patient continues to have carpal tunnel symptoms.  I encouraged her to buy a new, supportive brace that will keep her hand from flexing during sleep and to wear it every night.  She can keep the brace slightly looser to avoid adding pressure to her median nerve and causing pain.  She will need to continue doing this for 4-8 more weeks before we can decide if this treatment has actually failed.  Told her that she could also get a corticosteroid injection in her forearm if bracing does not work.  Sleep deprivation Due to patient's obesity, daytime fatigue, and waking up feeling fatigued, she may have sleep apnea.  I will refer her to get a sleep study today.  I also think that her eye symptoms are related to her sleep deprivation.  I reassured the patient that her blood sugar is not high enough to cause blurry vision or eye damage because her last A1c was 5.8.    Maia Breslow, M.D. 10/18/2019, 4:07 PM PGY-3, [facility]

## 2019-10-18 NOTE — Assessment & Plan Note (Signed)
Patient continues to have carpal tunnel symptoms.  I encouraged her to buy a new, supportive brace that will keep her hand from flexing during sleep and to wear it every night.  She can keep the brace slightly looser to avoid adding pressure to her median nerve and causing pain.  She will need to continue doing this for 4-8 more weeks before we can decide if this treatment has actually failed.  Told her that she could also get a corticosteroid injection in her forearm if bracing does not work.

## 2019-10-18 NOTE — Patient Instructions (Addendum)
It was nice seeing you today Romaine!  Please find a strong brace for your left arm because you still have carpal tunnel there.  Wear this every single night and during the day if you want to.  You can always get an injection in that area if wearing the brace every night for 2 months does not help.  I am sorry this is still bothering you.  I will place a referral to get a sleep study since you might have sleep apnea.  This could help you sleep better at night.  I will look into why you have not gotten called by dermatology yet.  If you have any questions or concerns, please feel free to call the clinic.   Be well,  Dr. Shan Levans

## 2019-10-18 NOTE — Assessment & Plan Note (Addendum)
Due to patient's obesity, daytime fatigue, and waking up feeling fatigued, she may have sleep apnea.  I will refer her to get a sleep study today.  I also think that her eye symptoms are related to her sleep deprivation.  I reassured the patient that her blood sugar is not high enough to cause blurry vision or eye damage because her last A1c was 5.8.

## 2019-10-23 ENCOUNTER — Other Ambulatory Visit: Payer: Self-pay | Admitting: Family Medicine

## 2019-10-23 DIAGNOSIS — Z72820 Sleep deprivation: Secondary | ICD-10-CM

## 2019-10-23 NOTE — Progress Notes (Signed)
Sleep study order.

## 2019-10-30 DIAGNOSIS — H04123 Dry eye syndrome of bilateral lacrimal glands: Secondary | ICD-10-CM | POA: Diagnosis not present

## 2019-10-31 ENCOUNTER — Other Ambulatory Visit: Payer: Self-pay

## 2019-10-31 ENCOUNTER — Encounter: Payer: Self-pay | Admitting: Family Medicine

## 2019-10-31 ENCOUNTER — Ambulatory Visit (INDEPENDENT_AMBULATORY_CARE_PROVIDER_SITE_OTHER): Payer: Medicaid Other | Admitting: Family Medicine

## 2019-10-31 DIAGNOSIS — G5602 Carpal tunnel syndrome, left upper limb: Secondary | ICD-10-CM

## 2019-10-31 DIAGNOSIS — L739 Follicular disorder, unspecified: Secondary | ICD-10-CM

## 2019-10-31 MED ORDER — MUPIROCIN 2 % EX OINT: 1.0000 "application " | TOPICAL_OINTMENT | Freq: Two times a day (BID) | CUTANEOUS | 2 refills | Status: DC

## 2019-10-31 NOTE — Progress Notes (Signed)
   Subjective:    Lauren Obrien - 18 y.o. female MRN [id number]  Date of birth: 03/26/2001  CC:  Lauren Obrien is here for follow up of L arm numbness, bumps on the vulva.  HPI:  Left arm numbness Continues to have arm numbness and "heaviness" Having trouble washing dishes, holding phone Worse when she is typing Has numbness throughout the day, less so at night  Has been using the brace that the clinic provided on her L arm every night  Bumps on vulva These bumps occur when she is shaving but also when she has taken a long break from shaving They are painful and sometimes bleed but do not have any drainage Are only located on the outside of the labia majora Denies fever and fatigue  Health Maintenance:  There are no preventive care reminders to display for this patient.  -  reports that she has never smoked. She has never used smokeless tobacco. - Review of Systems: Per HPI. - Past Medical History: Patient Active Problem List   Diagnosis Date Noted  . Folliculitis 27/74/1287  . Prediabetes 08/29/2019  . Dizziness 08/23/2019  . Memory difficulties 07/01/2019  . Chronic tension type headache 05/29/2019  . Acanthosis nigricans 05/29/2019  . Plantar fasciitis 05/14/2019  . 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 Hidradenitis suppurativa 03/04/2019  . Carpal tunnel syndrome of left wrist 01/04/2019  . Blurry vision, bilateral 12/16/2018  . Learning difficulty 12/16/2018  . Left wrist tendinitis 12/01/2018  . Sleep deprivation 09/28/2018  . Recurrent boils 09/21/2018  . Irregular menstrual bleeding 04/03/2018  . Acne vulgaris 04/03/2018  . Acid reflux 01/21/2016  . Allergic rhinitis 09/23/2015   - Medications: reviewed and updated   Objective:   Physical Exam BP 118/68   Pulse (!) 102   SpO2 98%   Breastfeeding No  Gen: NAD, alert, cooperative with exam, well-appearing, obese GU: Two erythematous, nonvesicular papules with a yellowish center  located on the outside of the right labia majora and one papule located on the outside of the left labia majora, tender to palpation but without fluctuance Neuro: No visual abnormality of the left or right forearm, Phalen sign positive on the left, Tinel's sign negative     Assessment & Plan:   Folliculitis Due to the erythematous, nodular appearance of patient's rash and its location in her pubic hair rather than on the mucosal surface of the labia, this is likely folliculitis that may have a superficial overlying infection.  Counseled patient that this can occur due to shaving but can also occur without shaving.  Reassured patient that this is not dangerous to her and does not represent an STI.  Will prescribe mupirocin ointment for these lesions.  Carpal tunnel syndrome of left wrist Although patient's nerve conduction studies were normal, which makes carpal tunnel less likely, she clinically fits this diagnosis, and no other diagnosis seems more likely at this time.  Counseled patient to try wearing her brace in the daytime as well as nighttime and to continue to be patient about the resolution of her symptoms.  Patient understands that she could receive a corticosteroid injection or carpal tunnel release surgery if her symptoms do not resolve, but she is not interested in these interventions.    Maia Breslow, M.D. 11/01/2019, 9:13 AM PGY-3, [place]

## 2019-10-31 NOTE — Patient Instructions (Addendum)
It was nice seeing you today Lauren Obrien!  Since her arm hurts more during the day than at night, please try wearing the brace during the daytime as well as at nighttime since this will likely make it feel better.  You will likely need to wear this for several weeks to see improvement, so be patient.  I am prescribing a antibiotic ointment that you can put on the bumps on your vulva.  I think that they are likely little areas of infection that are not dangerous to you but are certainly painful.  You can use this oitnment every day.  If you have any questions or concerns, please feel free to call the clinic.   Be well,  Dr. Shan Levans

## 2019-11-01 NOTE — Assessment & Plan Note (Signed)
Due to the erythematous, nodular appearance of patient's rash and its location in her pubic hair rather than on the mucosal surface of the labia, this is likely folliculitis that may have a superficial overlying infection.  Counseled patient that this can occur due to shaving but can also occur without shaving.  Reassured patient that this is not dangerous to her and does not represent an STI.  Will prescribe mupirocin ointment for these lesions.

## 2019-11-01 NOTE — Assessment & Plan Note (Addendum)
Although patient's nerve conduction studies were normal, which makes carpal tunnel less likely, she clinically fits this diagnosis, and no other diagnosis seems more likely at this time.  Counseled patient to try wearing her brace in the daytime as well as nighttime and to continue to be patient about the resolution of her symptoms.  Patient understands that she could receive a corticosteroid injection or carpal tunnel release surgery if her symptoms do not resolve, but she is not interested in these interventions.

## 2019-11-13 ENCOUNTER — Other Ambulatory Visit: Payer: Self-pay

## 2019-11-13 ENCOUNTER — Telehealth (INDEPENDENT_AMBULATORY_CARE_PROVIDER_SITE_OTHER): Payer: Medicaid Other | Admitting: Student in an Organized Health Care Education/Training Program

## 2019-11-13 DIAGNOSIS — J029 Acute pharyngitis, unspecified: Secondary | ICD-10-CM

## 2019-11-13 HISTORY — DX: Acute pharyngitis, unspecified: J02.9

## 2019-11-13 NOTE — Progress Notes (Signed)
[facility] Telemedicine Visit  Patient consented to have virtual visit. Method of visit: Telephone  Encounter participants: Patient: Lauren Obrien - located at home Provider: Richarda Osmond - located at [facility] Others (if applicable): deseree  Chief Complaint: sore throat  HPI: Attempted to call patient with interpretor at time of appointment with no answer.  Second call with interpretor, patient answered and declined use of interpretor.  Patient describes that starting 2 days ago began having pains/itching in throat. Hurts worse at night. Associated with Coughing with clear Phlegm. She believes her symptoms have improved since onset.  Denies SOB, chest pain, fever.  Positive for chills, clear runny nose.  Has tried no medicines. Not tried anything to make it feel better. Spoon of honey at night, didn't help.   ROS: per HPI  Pertinent PMHx: frequent sore throats- last tested positive for strep throat 03/31/2018.   Exam:  Respiratory: raspy voice Negative for conversational dyspnea or cough during appointment.   Assessment/Plan:  Pharyngitis Unknown cause but likely viral based on history, could also be allergic in nature. Has history of mono and strep A within the past year.  Based on current policies at the clinic for sick visits, I recommended patient go to urgent care if she would like to be tested for strep throat. I'm unsure if she is willing to do this. I also offered COVID testing and she declined. centor criteria gives her a score of 0-2 depending on what her physical exam findings are so I would elect to not test her at this time and explained to patient.  Recommended conservative management of warm beverages, staying well hydrated, honey, rest, isolation, hand-washing, and taking tylenol as needed for pain. Tried to provide patient with education/reassurance. She was very disappointed that I would not have her come in for strep testing here.   She can present to urgent care/ER if experiencing worsening symptoms.     Time spent during visit with patient: 15 minutes

## 2019-11-13 NOTE — Assessment & Plan Note (Signed)
Unknown cause but likely viral based on history, could also be allergic in nature. Has history of mono and strep A within the past year.  Based on current policies at the clinic for sick visits, I recommended patient go to urgent care if she would like to be tested for strep throat. I'm unsure if she is willing to do this. I also offered COVID testing and she declined. centor criteria gives her a score of 0-2 depending on what her physical exam findings are so I would elect to not test her at this time and explained to patient.  Recommended conservative management of warm beverages, staying well hydrated, honey, rest, isolation, hand-washing, and taking tylenol as needed for pain. Tried to provide patient with education/reassurance. She was very disappointed that I would not have her come in for strep testing here.  She can present to urgent care/ER if experiencing worsening symptoms.

## 2019-11-14 ENCOUNTER — Ambulatory Visit: Payer: Medicaid Other | Admitting: Family Medicine

## 2019-12-03 ENCOUNTER — Telehealth: Payer: Self-pay | Admitting: *Deleted

## 2019-12-03 NOTE — Telephone Encounter (Signed)
Patient states that she has been using proactiv for 3 days and noticed that she was having some irritation on her face.  She has stopped using it and would like to know if she can get something to help with itching.  I suggested to patient that she try OTC zyrtec or benadryl for the itching.  Patient voiced understanding but would like to have this sent in to her pharmacy so she can use her medicaid to cover the cost.  Will forward to MD.  Burnard Hawthorne

## 2019-12-04 ENCOUNTER — Other Ambulatory Visit: Payer: Self-pay | Admitting: Family Medicine

## 2019-12-04 ENCOUNTER — Other Ambulatory Visit: Payer: Self-pay

## 2019-12-04 ENCOUNTER — Telehealth (INDEPENDENT_AMBULATORY_CARE_PROVIDER_SITE_OTHER): Payer: Medicaid Other | Admitting: Family Medicine

## 2019-12-04 ENCOUNTER — Encounter: Payer: Self-pay | Admitting: Family Medicine

## 2019-12-04 DIAGNOSIS — J029 Acute pharyngitis, unspecified: Secondary | ICD-10-CM | POA: Diagnosis not present

## 2019-12-04 MED ORDER — DIPHENHYDRAMINE-ZINC ACETATE 2-0.1 % EX CREA: 1.0000 "application " | TOPICAL_CREAM | Freq: Three times a day (TID) | CUTANEOUS | 0 refills | Status: DC | PRN

## 2019-12-04 MED ORDER — CETIRIZINE HCL 10 MG PO TABS: 10.0000 mg | ORAL_TABLET | Freq: Every day | ORAL | 0 refills | Status: DC

## 2019-12-04 MED ORDER — AMOXICILLIN 500 MG PO CAPS: 500.0000 mg | ORAL_CAPSULE | Freq: Two times a day (BID) | ORAL | 0 refills | Status: DC

## 2019-12-04 NOTE — Telephone Encounter (Signed)
LVM telling pt that a Rx has been sent to the pharmacy for her. Pt also has a virtual appt with Dr. Selena Batten this afternoon. Sunday Spillers, CMA

## 2019-12-04 NOTE — Assessment & Plan Note (Signed)
Given patient's history and Centor criteria of 3, will start treatment for strep throat. If no improvement in 4-5 days, patient to call back for further follow up.  Patient declines COVID testing.

## 2019-12-04 NOTE — Telephone Encounter (Signed)
I have sent in zyrtec tablets and benadryl cream for her itching.  Thanks.

## 2019-12-04 NOTE — Progress Notes (Signed)
[facility] Telemedicine Visit  Patient consented to have virtual visit. Method of visit: Video was attempted, but technology challenges prevented patient from using video, so visit was conducted via telephone.  Encounter participants: Patient: Lauren Obrien - located at home Provider: Melene Plan - located at [facility] Others (if applicable): none  Chief Complaint: Sore Throat   HPI:  Sore throat started yesterday. She has been having trouble talking due to pain. History of recurrent strep throat, but this seems different. Also, patient reports headache with. No fevers, congestion, cough, no positive COVID contacts. She reports clear phlegm. No shortness of breath. Brother has a cold at home. Patient reports that she had a sick last week. She does report some white on her tonsils and that the back of her throat is red.   ROS: per HPI  Pertinent PMHx: Hx of recurrent strep pharyngitis.   Exam:  Respiratory: Appears to be breathing   Assessment/Plan:  Pharyngitis Given patient's history and Centor criteria of 3, will start treatment for strep throat. If no improvement in 4-5 days, patient to call back for further follow up.  Patient declines COVID testing.     Time spent during visit with patient: 20 minutes

## 2019-12-04 NOTE — Telephone Encounter (Signed)
Pt spoke with Dr. Selena Batten today. Sunday Spillers, CMA

## 2019-12-13 ENCOUNTER — Other Ambulatory Visit: Payer: Self-pay

## 2019-12-13 ENCOUNTER — Encounter: Payer: Self-pay | Admitting: Family Medicine

## 2019-12-13 ENCOUNTER — Ambulatory Visit (INDEPENDENT_AMBULATORY_CARE_PROVIDER_SITE_OTHER): Payer: Medicaid Other | Admitting: Family Medicine

## 2019-12-13 DIAGNOSIS — Z72820 Sleep deprivation: Secondary | ICD-10-CM | POA: Diagnosis not present

## 2019-12-13 NOTE — Patient Instructions (Addendum)
It was nice seeing you today Kaleen!  Tomorrow I will follow up with your referral to make sure that you can be seen soon.  I am concluding therapy resources and I agree that this is a great idea.  I do think that your sleep quality can be improved and this will likely help you to feel better all around.  I am always happy to speak with you over the telephone if you would like to make a virtual appointment as well.  Keep up the excellent work.  If you have any questions or concerns, please feel free to call the clinic.   Be well,  Dr. Shan Levans  Therapy and Counseling Resources Most providers on this list will take Medicaid. Patients with commercial insurance or Medicare should contact their insurance company to get a list of in network providers.  [facility] [address] [place], [place] [address]       [phone]     [facility] [address] [place], [place]    Jinny Blossom [facility] [address], [place], [place]  Family [facility]:  [place]. [place]  [facility] Counsel[facility]:  [place] [address] Loni Muse, [place]  [facility] (under & uninsured) [address] [place] [id number]    [email or web address] [place]     Phone:  [phone]     [place]  [place] [address]      [place], [place] ext [place]: [place], [place], [place]   [place] ([place] therapist) [address] [place] [address]   [place] [address]    [phone]    [facility]   [place],  [place], [place]   [place]  [place]  [facility]  [address] [place], [place]        [phone]  Open Access/Walk In Clinic under & uninsured [place],  [address] [place] [phone]):  Mon - Fri from 8 AM - 3 PM  [facility] [place],  ([place])   [place], [place]: [phone]) 8:30 - 12; 1 - 2:30  [facility] of the [place],  [place], [place]    ([phone]):8:30 - 12; 2 - 3PM  [facility] [employer],  [address],  [place]; [phone]):   Mon - Fri 8 AM - 5 PM  [facility] [place]  MWF 12:30 to 3:00 or call to schedule an appointment  [phone]  Specific Provider options Psychology Today  [email or web address] 1. click on find a therapist  2. enter your zip code 3. left side and select or tailor a therapist for your specific need.   [facility] Provider Directory [email or web address]  (Medicaid)   Follow all drop down to find a provider  [place] or [email or web address], [place], [place] Recovery support and educational   In home counseling [place] Telephone: [phone]  office in [place] [email or web address]   Does not take reg. Medicaid or Medicare  private Editor, commissioning, [place] health Choice, [facility], [place], [place], [place], [place] Health Choice  24- Hour Availability:  . [facility]   [phone] or [phone]  . Family [facility] of the [facility] [phone]  [place] [facility]  [phone]   . [facility] [employer]  [phone] (after hours)  . Therapeutic Alternative/Mobile Crisis   [phone]  . [place] National Suicide Hotline  5743593468 (TALK)  . Call 911 or go to emergency room  . [employer]  [phone]);  [place] and [employer]   . [facility] ACCESS   [phone]); [place], [place], [place], [place], [place], [place], [place]

## 2019-12-13 NOTE — Progress Notes (Signed)
   Subjective:    Lauren Obrien - 19 y.o. female MRN [id number]  Date of birth: 09-Jul-2001  CC:  Lauren Obrien is here for fatigue.  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  -  reports that she has never smoked. She has never used smokeless tobacco. - Review of Systems: Per HPI. - Past Medical History: Patient Active Problem List   Diagnosis Date Noted  . Pharyngitis 11/13/2019  . Folliculitis 08/29/2019  . Prediabetes 08/29/2019  . Dizziness 08/23/2019  . Memory difficulties 07/01/2019  . Chronic tension type headache 05/29/2019  . Acanthosis nigricans 05/29/2019  . Plantar fasciitis 05/14/2019  . 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 Hidradenitis suppurativa 03/04/2019  . Carpal tunnel syndrome of left wrist 01/04/2019  . Blurry vision, bilateral 12/16/2018  . Learning difficulty 12/16/2018  . Left wrist tendinitis 12/01/2018  . Sleep deprivation 09/28/2018  . Recurrent boils 09/21/2018  . Irregular menstrual bleeding 04/03/2018  . Acne vulgaris 04/03/2018  . Acid reflux 01/21/2016  . Allergic rhinitis 09/23/2015   - Medications: reviewed and updated   Objective:   Physical Exam BP 114/60   Pulse 98   Wt 297 lb 8 oz (134.9 kg)   SpO2 98%   BMI 49.51 kg/m  Gen:  NAD, alert, cooperative with exam, obese, pleasant CV: RRR, good S1/S2, no murmur, no edema Resp: CTABL, no wheezes, non-labored Psych: good insight, alert and oriented, sad mood and affect  Assessment & Plan:   Sleep deprivation Concern that patient is not getting enough restorative sleep to meet the various demands she is facing.  She may have sleep apnea given her obesity and daytime sleepiness.  A referral for a sleep study was placed on December 2, and I will follow up to ensure that patient receives an appointment for follow up.  I do think that she is experiencing anxiety and possible depression due to the chronic stress and sleep deprivation she is experiencing and agreed that finding a therapist is an excellent idea.  A handout with contact information for many therapists in the area was provided.  Concerning medical reasons for her fatigue, she has undergone a thorough workup including TSH, BMP, CBC in the last year, all of which are normal.  Patient's excess weight and poor eating habits are likely also be playing a role, and lifestyle changes were discussed.      Lezlie Octave, M.D. 12/14/2019, 9:57 AM PGY-3, [facility]

## 2019-12-14 NOTE — Assessment & Plan Note (Signed)
Concern that patient is not getting enough restorative sleep to meet the various demands she is facing.  She may have sleep apnea given her obesity and daytime sleepiness.  A referral for a sleep study was placed on December 2, and I will follow up to ensure that patient receives an appointment for follow up.  I do think that she is experiencing anxiety and possible depression due to the chronic stress and sleep deprivation she is experiencing and agreed that finding a therapist is an excellent idea.  A handout with contact information for many therapists in the area was provided.  Concerning medical reasons for her fatigue, she has undergone a thorough workup including TSH, BMP, CBC in the last year, all of which are normal.  Patient's excess weight and poor eating habits are likely also be playing a role, and lifestyle changes were discussed.

## 2019-12-25 ENCOUNTER — Encounter: Payer: Self-pay | Admitting: Neurology

## 2019-12-25 ENCOUNTER — Other Ambulatory Visit: Payer: Self-pay

## 2019-12-25 ENCOUNTER — Ambulatory Visit: Payer: Medicaid Other | Admitting: Neurology

## 2019-12-25 VITALS — BP 116/78 | HR 95 | Temp 97.4°F | Ht 66.0 in | Wt 292.0 lb

## 2019-12-25 DIAGNOSIS — R5382 Chronic fatigue, unspecified: Secondary | ICD-10-CM

## 2019-12-25 DIAGNOSIS — R0683 Snoring: Secondary | ICD-10-CM

## 2019-12-25 DIAGNOSIS — Z6841 Body Mass Index (BMI) 40.0 and over, adult: Secondary | ICD-10-CM | POA: Diagnosis not present

## 2019-12-25 NOTE — Patient Instructions (Signed)
Please remember to try to maintain good sleep hygiene, which means: Keep a regular sleep and wake schedule, try not to exercise or have a meal within 2 hours of your bedtime, try to keep your bedroom conducive for sleep, that is, cool and dark, without light distractors such as an illuminated alarm clock, and refrain from watching TV right before sleep or in the middle of the night and do not keep the TV or radio on during the night. Also, try not to use or play on electronic devices at bedtime, such as your cell phone, tablet PC or laptop. If you like to read at bedtime on an electronic device, try to dim the background light as much as possible. Do not eat in the middle of the night.   We will request a sleep study.    We will look for leg twitching and snoring or sleep apnea.   We will call you with the sleep study results and make a follow up appointment if needed.    

## 2019-12-25 NOTE — Progress Notes (Signed)
SLEEP MEDICINE CLINIC    Provider:  Melvyn Novas, MD  Primary Care Physician:  Lennox Solders, MD [address] [place] [address]     Referring Provider: Lennox Solders, Md [address] [place],  [place] [address]          Chief Complaint according to patient   Patient presents with:    . New Patient (Initial Visit)     here to discuss is apnea is of concern. she has never had a SS. unsure if snores or has apnea events. describes sleeping anywhere from 6-8 hrs and still wakes up tired. brain fog and memory concerns, she is waking up with tightness in jaw/teeth and unsure if that is causing pressure  headaches around front and temporal area .      HISTORY OF PRESENT ILLNESS:  Lauren Obrien is a 19 y.o. year old Hispanic  female patient seen here as a referral on 12/25/2019 . She is a primary patient of Dr. Marjory Lies.   Chief concern according to patient : possible sleep disorder.  She  has a past medical history of Acne vulgaris, GERD (gastroesophageal reflux disease), Helicobacter pylori gastritis, Hidradenitis suppurativa, and Neuropathy.  The patient is morbidly obese- BMI 47 kg/m2.  Her headaches started 4 years ago, daily migraines at the time. It improved for a while- and returned when under stress, when under test situations. She described the headaches as arising from the temple, pressure, retro-orbital.  Rarely sharp sensation. Sometimes nauseated- but not related to headaches.  No olfactory triggers, but photophobia, phonophobia.   Family medical /sleep history: brother has OSA, untreated.    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 currently works part timeArt gallery manager. Pets are not present. Tobacco use; never - but father is a smoker-.  ETOH use : none , Caffeine intake in form of Coffee( none) Soda( 3 a week) Tea (none  ) or energy  drinks..    Sleep habits are as follows: The patient's dinner time is between 5 PM. The patient goes to bed at 11-12 PM and continues to sleep for 6-8 hours,  Is restless, wakes rarely for1 bathroom break. She averages 6-7 hours of sleep at night.   The preferred sleep position is on her side, with the support of 1 pillow.  Dreams are reportedly present most night.   7.30-8 AM is the usual rise time. The patient wakes up 6.50 with an alarm.   She reports not feeling refreshed or restored in AM, with symptoms such as dry mouth, feeling tired, rarely morning headaches and residual fatigue.  Naps are taken infrequently.  Review of Systems: Out of a complete 14 system review, the patient complains of only the following symptoms, and all other reviewed systems are negative.:  Fatigue, sleepiness , snoring, fragmented sleep, bruxism, sleep hygiene needs improvement.   How likely are you to doze in the following situations: 0 = not likely, 1 = slight chance, 2 = moderate chance, 3 = high chance   Sitting and Reading? Watching Television? Sitting inactive in a public place (theater or meeting)? As a passenger in a car for an hour without a break? Lying down in the afternoon when circumstances permit? Sitting and talking to someone? Sitting quietly after lunch without alcohol? In a car, while stopped for a few minutes in traffic?  Total = 13/ 24 points   FSS endorsed at 48/ 63 points.   Social History   Socioeconomic History  . Marital status: Single    Spouse name: Not on file  . Number of children: 1  . Years of education: 27  . Highest education level: Not on file  Occupational History    Comment: [employer] student  Tobacco Use  . Smoking status: Never Smoker  . Smokeless tobacco: Never Used  Substance and Sexual Activity  . Alcohol use: No    Alcohol/week: 0.0 standard drinks  . Drug use: No  . Sexual activity: Not Currently    Comment: 5 months post-partum  Other Topics Concern    . Not on file  Social History Narrative       11th grade does not do well in school   Social Determinants of Health   Financial Resource Strain:   . Difficulty of Paying Living Expenses: Not on file  Food Insecurity:   . Worried About Programme researcher, broadcasting/film/video in the Last Year: Not on file  . Ran Out of Food in the Last Year: Not on file  Transportation Needs:   . Lack of Transportation (Medical): Not on file  . Lack of Transportation (Non-Medical): Not on file  Physical Activity:   . Days of Exercise per Week: Not on file  . Minutes of Exercise per Session: Not on file  Stress:   . Feeling of Stress : Not on file  Social Connections:   . Frequency of Communication with Friends and Family: Not on file  . Frequency of Social Gatherings with Friends and Family: Not on file  . Attends Religious Services: Not on file  . Active Member of Clubs or Organizations: Not on file  . Attends Banker Meetings: Not on file  . Marital Status: Not on file    Family History  Problem Relation Age of Onset  . Diabetes Father   . Hypertension Father   . Diabetes Paternal Grandmother   . Diabetes Paternal Grandfather   . Diabetes Maternal Grandfather   . Asthma Maternal Grandmother   . Diabetes Maternal Grandmother     Past Medical History:  Diagnosis Date  . Acne vulgaris   . GERD (gastroesophageal reflux disease)   . Helicobacter pylori gastritis   . Hidradenitis suppurativa   . Neuropathy    BUE    No past surgical history on file.   No current outpatient medications on file prior to visit.   No current facility-administered medications on file prior to visit.    No Known Allergies  Physical exam:  Today's Vitals   12/25/19 1014  BP: 116/78  Pulse: 95  Temp: (!) 97.4 F (36.3 C)  Weight: 292 lb (132.5 kg)  Height: 5\' 6"  (1.676 m)   Body mass index is 47.13 kg/m.   Wt Readings from Last 3 Encounters:  12/25/19 292 lb (132.5 kg) (>99 %, Z= 2.78)*   12/13/19 297 lb 8 oz (134.9 kg) (>99 %, Z= 2.81)*  10/18/19 296 lb 9.6 oz (134.5 kg) (>99 %, Z= 2.79)*   * Growth percentiles are based on CDC (Girls, 2-20 Years) data.     Ht Readings from Last 3 Encounters:  12/25/19 5\' 6"  (1.676 m) (75 %, Z= 0.67)*  08/21/19 5\' 5"  (1.651 m) (61 %, Z= 0.28)*  06/26/19 5\' 6"  (1.676 m) (75 %, Z= 0.68)*   * Growth percentiles are based on CDC (Girls, 2-20 Years) data.  General: The patient is awake, alert and appears not in acute distress. The patient is well groomed. Head: Normocephalic, atraumatic. Neck is supple. Mallampati 3,  neck circumference:16. 5 inches . Nasal airflow   patent.  Retrognathia is not  seen. Bruxism marks.  Dental status:  Cardiovascular:  Regular rate and cardiac rhythm by pulse,  without distended neck veins. Respiratory: Lungs are clear to auscultation.  Skin:  Without evidence of ankle edema, or rash. Trunk: The patient's posture is erect.   Neurologic exam : The patient is awake and alert, oriented to place and time.   Memory subjective described as intact.  Attention span & concentration ability appears normal.  Speech is fluent,  without  dysarthria, dysphonia or aphasia.  Mood and affect are appropriate.   Cranial nerves: no loss of smell or taste reported  Pupils are equal and briskly reactive to light. Funduscopic exam deferred.  Extraocular movements in vertical and horizontal planes were intact and without nystagmus. No Diplopia. Visual fields by finger perimetry are intact. Hearing was intact to soft voice and finger rubbing.   Facial sensation intact to fine touch. Facial motor strength is symmetric and tongue and uvula move midline.  Neck ROM : rotation, tilt and flexion extension were normal for age and shoulder shrug was symmetrical.    Motor exam:  Symmetric bulk, tone and ROM.   Normal tone without cog wheeling, symmetric grip strength .   Sensory:  Fine touch, pinprick and vibration were  tested  and  normal.  Proprioception tested in the upper extremities was normal.   Coordination: Rapid alternating movements in the fingers/hands were of normal speed.  The Finger-to-nose maneuver was intact without evidence of ataxia, dysmetria or tremor.   Gait and station: Patient could rise unassisted from a seated position, walked without assistive device.  Stance is of normal width/ base and the patient turned with 3 steps.  Toe and heel walk were deferred.  Deep tendon reflexes: in the  upper and lower extremities are symmetric and intact.  Babinski response was deferred .     Her  main problem is fatigue- not hypersomnia. She is describing also headaches and numbness which I will not address as she has seen a primary neurologist for this.  There are many risk factors, DM, Obesity, she has had e an elevated morning glucose, normal TSH and Vit B 12.   Rheumatological panel from 8-11 20202 was normal.  Her son is 26 years old and sleeps with her.   After spending a total time of 45 minutes face to face and additional time for physical and neurologic examination, review of laboratory studies,  personal review of imaging studies, reports and results of other testing and review of referral information / records as far as provided in visit, I have established the following assessments:  1) I will order a sleep study for next month,  2) in the meantime working on sleep hygiene.  Recommended melatonin. Eliminate screen light, the bedroom needs to be cool, quiet and dark.  3) set bed time and rise time.    My Plan is to proceed with:  1) Attended sleep study for evaluation of oxygen desaturation , ruling out OSA, Bruxism.  2) I gave her the sleep hygiene boot camp.   RV in 2-3 month if sleep study revealed a cause for fatigue and hypersomnia.   I would like to thank [name], Harlen Labs, MD and Lennox Solders, Md [address] [place],  [place] [address] for allowing me to meet with  and to take care of this pleasant patient.    CC: I will share my notes with Dr Leta Baptist.   Electronically signed by: Larey Seat, MD 12/25/2019 10:26 AM  [facility] Board certified by The [employer] of Sleep Medicine and Diplomate of the [employer] of Sleep Medicine. Board certified In Neurology through the [place], Fellow of the [employer] of Neurology. Medical Director of [facility].

## 2020-01-09 ENCOUNTER — Ambulatory Visit (INDEPENDENT_AMBULATORY_CARE_PROVIDER_SITE_OTHER): Payer: Medicaid Other | Admitting: Neurology

## 2020-01-09 ENCOUNTER — Other Ambulatory Visit: Payer: Self-pay

## 2020-01-09 DIAGNOSIS — G4719 Other hypersomnia: Secondary | ICD-10-CM

## 2020-01-09 DIAGNOSIS — G4733 Obstructive sleep apnea (adult) (pediatric): Secondary | ICD-10-CM

## 2020-01-09 DIAGNOSIS — R0683 Snoring: Secondary | ICD-10-CM

## 2020-01-09 DIAGNOSIS — R5382 Chronic fatigue, unspecified: Secondary | ICD-10-CM

## 2020-01-09 DIAGNOSIS — Z6841 Body Mass Index (BMI) 40.0 and over, adult: Secondary | ICD-10-CM

## 2020-01-11 ENCOUNTER — Ambulatory Visit (INDEPENDENT_AMBULATORY_CARE_PROVIDER_SITE_OTHER): Payer: Medicaid Other | Admitting: Family Medicine

## 2020-01-11 ENCOUNTER — Other Ambulatory Visit: Payer: Self-pay

## 2020-01-11 VITALS — BP 110/62 | HR 84 | Wt 296.4 lb

## 2020-01-11 DIAGNOSIS — K219 Gastro-esophageal reflux disease without esophagitis: Secondary | ICD-10-CM | POA: Diagnosis not present

## 2020-01-11 DIAGNOSIS — R1312 Dysphagia, oropharyngeal phase: Secondary | ICD-10-CM

## 2020-01-11 MED ORDER — OMEPRAZOLE 20 MG PO CPDR: 20.0000 mg | DELAYED_RELEASE_CAPSULE | Freq: Every day | ORAL | 3 refills | Status: DC

## 2020-01-11 NOTE — Assessment & Plan Note (Signed)
Foreign body lodged in the back of his throat versus GERD symptoms.  Low risk for any type of esophageal or oropharyngeal neoplasm. -Protonix 20 mg -Referral to GI

## 2020-01-11 NOTE — Patient Instructions (Addendum)
Gastroesophageal Reflux Disease, Adult Gastroesophageal reflux (GER) happens when acid from the stomach flows up into the tube that connects the mouth and the stomach (esophagus). Normally, food travels down the esophagus and stays in the stomach to be digested. With GER, food and stomach acid sometimes move back up into the esophagus. You may have a disease called gastroesophageal reflux disease (GERD) if the reflux:  Happens often.  Causes frequent or very bad symptoms.  Causes problems such as damage to the esophagus. When this happens, the esophagus becomes sore and swollen (inflamed). Over time, GERD can make small holes (ulcers) in the lining of the esophagus. What are the causes? This condition is caused by a problem with the muscle between the esophagus and the stomach. When this muscle is weak or not normal, it does not close properly to keep food and acid from coming back up from the stomach. The muscle can be weak because of:  Tobacco use.  Pregnancy.  Having a certain type of hernia (hiatal hernia).  Alcohol use.  Certain foods and drinks, such as coffee, chocolate, onions, and peppermint. What increases the risk? You are more likely to develop this condition if you:  Are overweight.  Have a disease that affects your connective tissue.  Use NSAID medicines. What are the signs or symptoms? Symptoms of this condition include:  Heartburn.  Difficult or painful swallowing.  The feeling of having a lump in the throat.  A bitter taste in the mouth.  Bad breath.  Having a lot of saliva.  Having an upset or bloated stomach.  Belching.  Chest pain. Different conditions can cause chest pain. Make sure you see your doctor if you have chest pain.  Shortness of breath or noisy breathing (wheezing).  Ongoing (chronic) cough or a cough at night.  Wearing away of the surface of teeth (tooth enamel).  Weight loss. How is this treated? Treatment will depend on how  bad your symptoms are. Your doctor may suggest:  Changes to your diet.  Medicine.  Surgery. Follow these instructions at home: Eating and drinking   Follow a diet as told by your doctor. You may need to avoid foods and drinks such as: ? Coffee and tea (with or without caffeine). ? Drinks that contain alcohol. ? Energy drinks and sports drinks. ? Bubbly (carbonated) drinks or sodas. ? Chocolate and cocoa. ? Peppermint and mint flavorings. ? Garlic and onions. ? Horseradish. ? Spicy and acidic foods. These include peppers, chili powder, curry powder, vinegar, hot sauces, and BBQ sauce. ? Citrus fruit juices and citrus fruits, such as oranges, lemons, and limes. ? Tomato-based foods. These include red sauce, chili, salsa, and pizza with red sauce. ? Fried and fatty foods. These include donuts, french fries, potato chips, and high-fat dressings. ? High-fat meats. These include hot dogs, rib eye steak, sausage, ham, and bacon. ? High-fat dairy items, such as whole milk, butter, and cream cheese.  Eat small meals often. Avoid eating large meals.  Avoid drinking large amounts of liquid with your meals.  Avoid eating meals during the 2-3 hours before bedtime.  Avoid lying down right after you eat.  Do not exercise right after you eat. Lifestyle   Do not use any products that contain nicotine or tobacco. These include cigarettes, e-cigarettes, and chewing tobacco. If you need help quitting, ask your doctor.  Try to lower your stress. If you need help doing this, ask your doctor.  If you are overweight, lose an amount   of weight that is healthy for you. Ask your doctor about a safe weight loss goal. General instructions  Pay attention to any changes in your symptoms.  Take over-the-counter and prescription medicines only as told by your doctor. Do not take aspirin, ibuprofen, or other NSAIDs unless your doctor says it is okay.  Wear loose clothes. Do not wear anything tight  around your waist.  Raise (elevate) the head of your bed about 6 inches (15 cm).  Avoid bending over if this makes your symptoms worse.  Keep all follow-up visits as told by your doctor. This is important. Contact a doctor if:  You have new symptoms.  You lose weight and you do not know why.  You have trouble swallowing or it hurts to swallow.  You have wheezing or a cough that keeps happening.  Your symptoms do not get better with treatment.  You have a hoarse voice. Get help right away if:  You have pain in your arms, neck, jaw, teeth, or back.  You feel sweaty, dizzy, or light-headed.  You have chest pain or shortness of breath.  You throw up (vomit) and your throw-up looks like blood or coffee grounds.  You pass out (faint).  Your poop (stool) is bloody or black.  You cannot swallow, drink, or eat. Summary  If a person has gastroesophageal reflux disease (GERD), food and stomach acid move back up into the esophagus and cause symptoms or problems such as damage to the esophagus.  Treatment will depend on how bad your symptoms are.  Follow a diet as told by your doctor.  Take all medicines only as told by your doctor. This information is not intended to replace advice given to you by your health care provider. Make sure you discuss any questions you have with your health care provider. Document Revised: 05/10/2018 Document Reviewed: 05/10/2018 Elsevier Patient Education  [place]. Dysphagia  Dysphagia is trouble swallowing. This condition occurs when solids and liquids stick in a person's throat on the way down to the stomach, or when food takes longer to get to the stomach than usual. You may have problems swallowing food, liquids, or both. You may also have pain while trying to swallow. It may take you more time and effort to swallow something. What are the causes? This condition may be caused by:  Muscle problems. They may make it difficult for you  to move food and liquids through the esophagus, which is the tube that connects your mouth to your stomach.  Blockages. You may have ulcers, scar tissue, or inflammation that blocks the normal passage of food and liquids. Causes of these problems include: ? Acid reflux from your stomach into your esophagus (gastroesophageal reflux). ? Infections. ? Radiation treatment for cancer. ? Medicines taken without enough fluids to wash them down into your stomach.  Stroke. This can affect the nerves and make it difficult to swallow.  Nerve problems. These prevent signals from being sent to the muscles of your esophagus to squeeze (contract) and move what you swallow down to your stomach.  Globus pharyngeus. This is a common problem that involves a feeling like something is stuck in your throat or a sense of trouble with swallowing, even though nothing is wrong with the swallowing passages.  Certain conditions, such as cerebral palsy or Parkinson's disease. What are the signs or symptoms? Common symptoms of this condition include:  A feeling that solids or liquids are stuck in your throat on the way down  to the stomach.  Pain while swallowing.  Coughing or gagging while trying to swallow. Other symptoms include:  Food moving back from your stomach to your mouth (regurgitation).  Noises coming from your throat.  Chest discomfort with swallowing.  A feeling of fullness when swallowing.  Drooling, especially when the throat is blocked.  Heartburn. How is this diagnosed? This condition may be diagnosed by:  Barium X-ray. In this test, you will swallow a white liquid that sticks to the inside of your esophagus. X-ray images are then taken.  Endoscopy. In this test, a flexible telescope is inserted down your throat to look at your esophagus and your stomach.  CT scans and an MRI. How is this treated? Treatment for dysphagia depends on the cause of this condition, such as:  If the  dysphagia is caused by acid reflux or infection, medicines may be used. They may include antibiotics and heartburn medicines.  If the dysphagia is caused by problems with the muscles, swallowing therapy may be used to help you strengthen your swallowing muscles. You may have to do specific exercises to strengthen the muscles or stretch them.  If the dysphagia is caused by a blockage or mass, procedures to remove the blockage may be done. You may need surgery and a feeding tube. You may need to make diet changes. Ask your health care provider for specific instructions. Follow these instructions at home: Medicines  Take over-the-counter and prescription medicines only as told by your health care provider.  If you were prescribed an antibiotic medicine, take it as told by your health care provider. Do not stop taking the antibiotic even if you start to feel better. Eating and drinking   Follow any diet changes as told by your health care provider.  Work with a diet and nutrition specialist (dietitian) to create an eating plan that will help you get the nutrients you need in order to stay healthy.  Eat soft foods that are easier to swallow.  Cut your food into small pieces and eat slowly. Take small bites.  Eat and drink only when you are sitting upright.  Do not drink alcohol or caffeine. If you need help quitting, ask your health care provider. General instructions  Check your weight every day to make sure you are not losing weight.  Do not use any products that contain nicotine or tobacco, such as cigarettes, e-cigarettes, and chewing tobacco. If you need help quitting, ask your health care provider.  Keep all follow-up visits as told by your health care provider. This is important. Contact a health care provider if you:  Lose weight because you cannot swallow.  Cough when you drink liquids.  Cough up partially digested food. Get help right away if you:  Cannot swallow your  saliva.  Have shortness of breath, a fever, or both.  Have a hoarse voice and also have trouble swallowing. Summary  Dysphagia is trouble swallowing. This condition occurs when solids and liquids stick in a person's throat on the way down to the stomach. You may cough or gag while trying to swallow.  Dysphagia has many possible causes.  Treatment for dysphagia depends on the cause of the condition.  Keep all follow-up visits as told by your health care provider. This is important. This information is not intended to replace advice given to you by your health care provider. Make sure you discuss any questions you have with your health care provider. Document Revised: 03/28/2019 Document Reviewed: 03/28/2019 Elsevier Patient Education  2020 [employer]  

## 2020-01-11 NOTE — Progress Notes (Signed)
    SUBJECTIVE:   CHIEF COMPLAINT / HPI:   Patient complaining of something stuck in the back of her throat.  Patient states she was eating corn 5 days ago when she swallowed it for like a piece got stuck in it.  Since then patient has had a feeling that something is lodged in there.  Patient denies any sore throat, throat pain, difficulty swallowing solids or liquids.  Does have a history of GERD and has abdominal reflux symptoms as well as a sour taste and a minimal cough.  Denies B-cell symptoms.  Does not smoke cigarettes or use tobacco products.  PERTINENT  PMH / PSH: GERD Elevated BMI No family history of esophageal cancers  OBJECTIVE:   BP 110/62   Pulse 84   Wt 296 lb 6 oz (134.4 kg)   SpO2 99%   BMI 47.84 kg/m   Gen: NAD, resting comfortably HEENT: Mallampati 3, moist mucous membranes, euthyroid, no cervical lymph adenopathy, normal swallow reflex Pulm: NWOB GI: Soft, Nontender, Nondistended. Neuro: grossly normal, moves all extremities Psych: Normal affect and thought content   ASSESSMENT/PLAN:   Oropharyngeal dysphagia Foreign body lodged in the back of his throat versus GERD symptoms.  Low risk for any type of esophageal or oropharyngeal neoplasm. -Protonix 20 mg -Referral to GI     Garnette Gunner, MD [facility]

## 2020-01-16 ENCOUNTER — Encounter: Payer: Self-pay | Admitting: Gastroenterology

## 2020-01-16 ENCOUNTER — Other Ambulatory Visit: Payer: Self-pay

## 2020-01-16 ENCOUNTER — Telehealth (INDEPENDENT_AMBULATORY_CARE_PROVIDER_SITE_OTHER): Payer: Medicaid Other | Admitting: Family Medicine

## 2020-01-16 DIAGNOSIS — N61 Mastitis without abscess: Secondary | ICD-10-CM | POA: Insufficient documentation

## 2020-01-16 DIAGNOSIS — L0390 Cellulitis, unspecified: Secondary | ICD-10-CM

## 2020-01-16 MED ORDER — SULFAMETHOXAZOLE-TRIMETHOPRIM 800-160 MG PO TABS: 1.0000 | ORAL_TABLET | Freq: Two times a day (BID) | ORAL | 0 refills | Status: AC

## 2020-01-16 NOTE — Progress Notes (Signed)
[facility] Medicine Center Telemedicine Visit  Patient consented to have virtual visit. Method of visit: Video  Encounter participants: Patient: Lauren Obrien - located at Home  Provider: Allayne Stack - located at [facility] Others (if applicable): None   Chief Complaint: Pimple on breast  HPI: Lauren Obrien is a 19 yo female presenting via video to discuss the following:  Breast lesion: Noted on her left breast a small darkened "pimple-like" area on the lateral aspect of her breast.  First came up 1.5 weeks ago, went away, and then came back 2 days ago.  Since that time she has noticed spreading erythema circumferentially with warmth to touch, started with just a little red around the area.  Sore with palpation.  Denies any drainage, fever, rash elsewhere, or fluid/mass/boil like area underneath.  Wonders if she was bit by a bug in this area, however not sure.  Does not seem like her previous folliculitis or hidradenitis issues that she has had in the past.  Has not tried anything for it.   ROS: per HPI  Pertinent PMHx: Acid reflux, chronic headaches, hidradenitis suppurativa/recurrent boils, folliculitis  Exam: Via video Respiratory: unlabored breathing  Skin: Appears to be a few mm papular-like hyperpigmented area with poorly demarcated circumferential erythema for at least a few centimeters on the upper outer quadrant away from areola.  Does not appear to be indurated, however difficult to tell.  Assessment/Plan:  Cellulitis of breast Region of poorly demarcated spreading erythema and warmth to touch consistent with cellulitis, unclear of precipitating etiology.  No systemic symptoms.  Could additionally consider hidradenitis suppurativa flare, however with worsening erythema and presentation differing from her previous, will treat as cellulitis for now.  As she requests a BID medication, sent in Bactrim for 5 days (IUD in place, no concerns of pregnancy at this time).  Fortunately  already has follow-up scheduled with her PCP on 3/9, will have area evaluated at that time.  Could consider trialing topical clindamycin in the future.    Follow-up scheduled 3/9  Time spent during visit with patient: 12 minutes  Allayne Stack, DO  Family Medicine PGY-2

## 2020-01-16 NOTE — Assessment & Plan Note (Addendum)
Region of poorly demarcated spreading erythema and warmth to touch consistent with cellulitis, unclear of precipitating etiology.  No systemic symptoms.  Could additionally consider hidradenitis suppurativa flare, however with worsening erythema and presentation differing from her previous, will treat as cellulitis for now.  As she requests a BID medication, sent in Bactrim for 5 days (IUD in place, no concerns of pregnancy at this time).  Fortunately already has follow-up scheduled with her PCP on 3/9, will have area evaluated at that time.  Could consider trialing topical clindamycin in the future.

## 2020-01-22 ENCOUNTER — Other Ambulatory Visit: Payer: Self-pay

## 2020-01-22 ENCOUNTER — Ambulatory Visit: Payer: Medicaid Other | Admitting: Family Medicine

## 2020-01-22 ENCOUNTER — Ambulatory Visit (INDEPENDENT_AMBULATORY_CARE_PROVIDER_SITE_OTHER): Payer: Medicaid Other | Admitting: Family Medicine

## 2020-01-22 ENCOUNTER — Encounter: Payer: Self-pay | Admitting: Family Medicine

## 2020-01-22 VITALS — BP 110/74 | HR 90 | Wt 295.4 lb

## 2020-01-22 DIAGNOSIS — L700 Acne vulgaris: Secondary | ICD-10-CM

## 2020-01-22 DIAGNOSIS — R5382 Chronic fatigue, unspecified: Secondary | ICD-10-CM | POA: Insufficient documentation

## 2020-01-22 DIAGNOSIS — R0683 Snoring: Secondary | ICD-10-CM | POA: Insufficient documentation

## 2020-01-22 DIAGNOSIS — G4719 Other hypersomnia: Secondary | ICD-10-CM | POA: Insufficient documentation

## 2020-01-22 DIAGNOSIS — G4733 Obstructive sleep apnea (adult) (pediatric): Secondary | ICD-10-CM | POA: Insufficient documentation

## 2020-01-22 HISTORY — DX: Snoring: R06.83

## 2020-01-22 MED ORDER — ADAPALENE 0.3 % EX GEL: 1.0000 "application " | Freq: Every day | CUTANEOUS | 2 refills | Status: DC

## 2020-01-22 MED ORDER — DOXYCYCLINE HYCLATE 100 MG PO TABS: 100.0000 mg | ORAL_TABLET | Freq: Every day | ORAL | 2 refills | Status: DC

## 2020-01-22 NOTE — Progress Notes (Addendum)
    SUBJECTIVE:   CHIEF COMPLAINT / HPI:   Acne Patient reports that she has struggled with acne for several years and has tried gels and oral medications before that have resulted in improvement, although her acne returned as soon as she stopped the medication.  She also has breakouts on her chest and back.  She cannot remember whether she made an appointment with dermatology recently or not and plans to call them soon.  She would like a medicine that she will not have to continuously take to treat her acne.  She also struggles with picking at her acne lesions and notes that she has several scars from this habit.  PERTINENT  PMH / PSH: Obesity, acanthosis nigricans, folliculitis, hidradenitis suppurativa, acne vulgaris  OBJECTIVE:   BP 110/74   Pulse 90   Wt 295 lb 6.4 oz (134 kg)   SpO2 98%   BMI 47.68 kg/m   General: well appearing, appears stated age, pleasant Skin: Many hyperpigmented lesions on patient's cheeks, forehead, and chin, some cystic lesions as well as open and closed comedones Psych: appropriate mood and affect    ASSESSMENT/PLAN:   Acne vulgaris Discussed with patient that she would likely benefit from a stronger medication for her acne since she has many lesions that include cysts.  Also discussed that breakouts on the chest and back are also common.  Since that she may benefit from Accutane, I strongly encouraged her to make an appointment with dermatology since this referral was sent several months ago.  Until she sees them, we will treat her acne with doxycycline 100 mg once per day and adapalene gel daily.  Pregnancy is highly unlikely since patient has the Mirena IUD for contraception.  Counseled patient that she may have increased skin dryness and sensitivity to sunlight while using this medication.  Also encouraged her to use another calming technique when she feels the urge to pick at her skin.     Lennox Solders, MD [facility]

## 2020-01-22 NOTE — Procedures (Signed)
PATIENT'S NAME:  Lauren Obrien, Lauren Obrien DOB:      May 30, 2001      MR#:    [id number]     DATE OF RECORDING: 01/09/2020 REFERRING M.D.:  Andrey Spearman, MD Study Performed:   Baseline Polysomnogram HISTORY:   Lauren Obrien is a 19 year- old Hispanic female patient with a possible sleep disorder.  She has headaches, is obese and excessively daytime sleepy. She has a medical history of Acne vulgaris, GERD (gastroesophageal reflux disease), Helicobacter pylori gastritis, Hidradenitis suppurativa, and Neuropathy. The patient is morbidly obese- BMI 47 kg/m2.  Her headaches started 4 years ago, daily migraines at the time. It improved for a while- and returned when under stress, when under test situations. She described the headaches as arising from the temple, pressure, retro-orbital.  Rarely sharp sensation. Sometimes nauseated- but not related to headaches.  No olfactory triggers, but photophobia, phonophobia.  Family medical /sleep history: brother has OSA, untreated.   The patient endorsed the Epworth Sleepiness Scale at 13/24 points.   The patient's weight 292 pounds with a height of 66 (inches), resulting in a BMI of 46.8 kg/m2. The patient's neck circumference measured 16.5 inches.  CURRENT MEDICATIONS: None listed   PROCEDURE:  This is a multichannel digital polysomnogram utilizing the Somnostar 11.2 system.  Electrodes and sensors were applied and monitored per AASM Specifications.   EEG, EOG, Chin and Limb EMG, were sampled at 200 Hz.  ECG, Snore and Nasal Pressure, Thermal Airflow, Respiratory Effort, CPAP Flow and Pressure, Oximetry was sampled at 50 Hz. Digital video and audio were recorded.      BASELINE STUDY: Lights Out was at 22:10 and Lights On at 04:45.  Total recording time (TRT) was 395.5 minutes, with a total sleep time (TST) of 338 minutes.   The patient's sleep latency was 51 minutes.  REM latency was 183 minutes.  The sleep efficiency was 85.5 %.     SLEEP ARCHITECTURE:  WASO (Wake after sleep onset) was 6 minutes.  There were 3.5 minutes in Stage N1, 209 minutes Stage N2, 96.5 minutes Stage N3 and 29 minutes in Stage REM.  The percentage of Stage N1 was 1.%, Stage N2 was 61.8%, Stage N3 was 28.6% and Stage R (REM sleep) was 8.6%.   RESPIRATORY ANALYSIS:  There were a total of 51 respiratory events:  26 obstructive apneas, 0 central apneas and 0 mixed apneas with a total of 26 apneas and an apnea index (AI) of 4.6 /hour. There were 25 hypopneas with a hypopnea index of 4.4 /hour.  The total APNEA/HYPOPNEA INDEX (AHI) was 9.1/hour.  24 events occurred in REM sleep and 34 events in NREM. The REM AHI was  49.7 /hour, versus a non-REM AHI of 5.2. The patient spent 55.5 minutes of total sleep time in the supine position and 283 minutes in non-supine. The supine AHI was 30.2 versus a non-supine AHI of 4.8.  OXYGEN SATURATION & C02:  The Wake baseline 02 saturation was 98%, with the lowest being 86%. Time spent below 89% saturation equaled 1 minute.  The arousals were noted as: 44 were spontaneous, 4 were associated with PLMs, 12 were associated with respiratory events. The patient had a total of 7 Periodic Limb Movements.  The Periodic Limb Movement (PLM) index was 1.2 and the PLM Arousal index was 0.7/hour. Audio and video analysis did not show any abnormal or unusual movements, behaviors, phonations or vocalizations.   EKG was in keeping with normal sinus rhythm (NSR).  IMPRESSION:  1. Mild Obstructive Sleep Apnea (OSA) but with strong REM sleep dependence.    RECOMMENDATIONS:   1)avoiding supine sleep 2) weight loss  3) Advise full-night, attended, CPAP titration study to optimize therapy.     I certify that I have reviewed the entire raw data recording prior to the issuance of this report in accordance with the Standards of Accreditation of the American Academy of Sleep Medicine (AASM)   Melvyn Novas, MD Diplomat, American Board of Psychiatry and  Neurology  Diplomat, American Board of Sleep Medicine Wellsite geologist, [facility] [employer]

## 2020-01-22 NOTE — Progress Notes (Signed)
IMPRESSION:   1. Mild Obstructive Sleep Apnea (OSA) but with strong REM sleep  dependence.    RECOMMENDATIONS:   1)avoiding supine sleep  2) weight loss  3) Advise full-night, attended, CPAP titration study to optimize  therapy.

## 2020-01-22 NOTE — Patient Instructions (Signed)
It was nice seeing you today Lauren Obrien!  I am sending in a gel to be used daily on your face as well as a pill to be taken once per day.  Please continue this therapy and make an appointment with dermatology.  If you have any questions or concerns, please feel free to call the clinic.   Be well,  Dr. Frances Furbish

## 2020-01-22 NOTE — Addendum Note (Signed)
Addended by: Melvyn Novas on: 01/22/2020 05:19 PM   Modules accepted: Orders

## 2020-01-23 ENCOUNTER — Telehealth: Payer: Self-pay | Admitting: Neurology

## 2020-01-23 ENCOUNTER — Other Ambulatory Visit: Payer: Self-pay | Admitting: Family Medicine

## 2020-01-23 NOTE — Telephone Encounter (Signed)
-----   Message from Melvyn Novas, MD sent at 01/22/2020  5:19 PM EST ----- IMPRESSION:   1. Mild Obstructive Sleep Apnea (OSA) but with strong REM sleep  dependence.    RECOMMENDATIONS:   1)avoiding supine sleep  2) weight loss  3) Advise full-night, attended, CPAP titration study to optimize  therapy.

## 2020-01-23 NOTE — Assessment & Plan Note (Addendum)
Discussed with patient that she would likely benefit from a stronger medication for her acne since she has many lesions that include cysts.  Also discussed that breakouts on the chest and back are also common.  Since that she may benefit from Accutane, I strongly encouraged her to make an appointment with dermatology since this referral was sent several months ago.  Until she sees them, we will treat her acne with doxycycline 100 mg once per day and adapalene gel daily.  Pregnancy is highly unlikely since patient has the Mirena IUD for contraception.  Counseled patient that she may have increased skin dryness and sensitivity to sunlight while using this medication.  Also encouraged her to use another calming technique when she feels the urge to pick at her skin.

## 2020-01-23 NOTE — Telephone Encounter (Signed)
I called pt. I advised pt that Dr. Vickey Huger reviewed their sleep study results and found that has sleep apnea and recommends that pt be treated with a cpap. Dr. Vickey Huger recommends that pt return for a repeat sleep study in order to properly titrate the cpap and ensure a good mask fit. Pt is agreeable to returning for a titration study. I advised pt that our sleep lab will file with pt's insurance and call pt to schedule the sleep study when we hear back from the pt's insurance regarding coverage of this sleep study. Pt verbalized understanding of results. Pt had no questions at this time but was encouraged to call back if questions arise.  A total of 15 min was spent with the patient discussing what sleep apnea was, what a CPAP was.  Pt verbalized understanding.

## 2020-01-28 ENCOUNTER — Other Ambulatory Visit: Payer: Self-pay

## 2020-01-28 ENCOUNTER — Other Ambulatory Visit: Payer: Self-pay | Admitting: Family Medicine

## 2020-01-28 ENCOUNTER — Other Ambulatory Visit (INDEPENDENT_AMBULATORY_CARE_PROVIDER_SITE_OTHER): Payer: Medicaid Other

## 2020-01-28 ENCOUNTER — Telehealth: Payer: Self-pay | Admitting: Family Medicine

## 2020-01-28 ENCOUNTER — Telehealth: Payer: Self-pay

## 2020-01-28 ENCOUNTER — Telehealth (INDEPENDENT_AMBULATORY_CARE_PROVIDER_SITE_OTHER): Payer: Medicaid Other | Admitting: Family Medicine

## 2020-01-28 DIAGNOSIS — J029 Acute pharyngitis, unspecified: Secondary | ICD-10-CM

## 2020-01-28 DIAGNOSIS — J0301 Acute recurrent streptococcal tonsillitis: Secondary | ICD-10-CM

## 2020-01-28 LAB — POCT RAPID STREP A (OFFICE): Rapid Strep A Screen: NEGATIVE

## 2020-01-28 MED ORDER — FLUTICASONE PROPIONATE 50 MCG/ACT NA SUSP: 2.0000 | Freq: Every day | NASAL | 6 refills | Status: DC

## 2020-01-28 MED ORDER — IBUPROFEN 400 MG PO TABS: 400.0000 mg | ORAL_TABLET | ORAL | 0 refills | Status: DC | PRN

## 2020-01-28 MED ORDER — LORATADINE 10 MG PO TABS: 10.0000 mg | ORAL_TABLET | Freq: Every day | ORAL | 0 refills | Status: DC

## 2020-01-28 MED ORDER — IBUPROFEN 200 MG PO TABS: 400.0000 mg | ORAL_TABLET | ORAL | 0 refills | Status: DC | PRN

## 2020-01-28 MED ORDER — PENICILLIN V POTASSIUM 500 MG PO TABS: 500.0000 mg | ORAL_TABLET | Freq: Three times a day (TID) | ORAL | 0 refills | Status: DC

## 2020-01-28 NOTE — Progress Notes (Signed)
[facility] Medicine Center Telemedicine Visit  Patient consented to have virtual visit. Method of visit: Video was attempted, but technology challenges prevented patient from using video, so visit was conducted via telephone.  Encounter participants: Patient: Lauren Obrien - located at home Provider: Garnette Gunner - located at office Others (if applicable): N/A  Chief Complaint: Sore throat  HPI:  Patient with sore throat for the past 3 to 4 days.  Reports that she has had some difficulty eating and drinking due to the pain.  She has been taking Cepacol spray which does help with the pain.  Patient with a history of recurrent sore throats.  She is concerned that this is strep throat.  Also states that this is associated with itching in her ears and her nose.  Does have some nasal drainage.  Denies fevers or chills.  Denies cough or shortness of breath or sick contacts.  Does endorse having "white spots in the back of my throat".  ROS: per HPI  Pertinent PMHx: Recurrent sore throats, last cultures approximately 1 year ago with strep were negative  Exam:  Respiratory: Able to speak in full sentences without issue  Assessment/Plan:  Sore throat Patient with sore throat and other symptoms possibly consistent with pharyngitis versus allergic rhinitis.  Centor criteria 2, optional strep testing.  PCP wrote a note earlier today typically treat and place referral to ENT.  Discussed with patient getting testing first and possibly trialing allergy medication given her symptoms.  Low likelihood of COVID-19. -Rapid strep and strep culture -Flonase -Agree with referral to ENT -discontinue pen. If rapid strep and culture negative.      Time spent during visit with patient: 12 minutes

## 2020-01-28 NOTE — Telephone Encounter (Signed)
I saw that the patient was able to have a telemedicine appointment today and was able to come in for rapid strep testing, which was negative.  I have discontinued her penicillin and will call the pharmacy to notify them shortly.  I have also left a message on the patient's phone reiterating that she should not take the penicillin if she has already picked it up from the pharmacy.

## 2020-01-28 NOTE — Telephone Encounter (Signed)
Pt is calling and would like to know if Dr. Frances Furbish can call her to discuss the virtual appointment she had today. Strep test was negative but she would like do discuss with her. I offered a virtual appointment with Dr. Frances Furbish on Wednesday but she stated she did not want to wait until Wednesday and only wanted to speak with her.   Please call pt back to discuss [phone]

## 2020-01-28 NOTE — Telephone Encounter (Signed)
Returned patient's call regarding her sore throat.  We discussed that these are often caused by viruses and allergies, and while sometimes our testing is not accurate, since she had a rapid test and throat culture that were negative, this is unlikely to be due to strep throat.  I advised that she use ibuprofen and Tylenol for her throat pain and to use Claritin or Zyrtec for her sneezing, runny nose, and ear fullness.  I have sent in ibuprofen and Claritin to her pharmacy.  She will follow up with me via telephone encounter on Thursday, 3/18.

## 2020-01-28 NOTE — Assessment & Plan Note (Signed)
Patient with sore throat and other symptoms possibly consistent with pharyngitis versus allergic rhinitis.  Centor criteria 2, optional strep testing.  PCP wrote a note earlier today typically treat and place referral to ENT.  Discussed with patient getting testing first and possibly trialing allergy medication given her symptoms.  Low likelihood of COVID-19. -Rapid strep and strep culture -Flonase -Agree with referral to ENT -discontinue pen. If rapid strep and culture negative.

## 2020-01-28 NOTE — Telephone Encounter (Signed)
Patient calls nurse line requesting abx be sent in for strep. Patient reports sore throat and white patches in the back of throat. Advised patient that she could go to urgent care to be tested and receive treatment. However, patient states that she would rather speak with provider and have medication sent in.    Patient also requests PCP send in referral for ENT as she states she continues to get strep.    To PCP and Dr. Brown Human, RN

## 2020-01-28 NOTE — Telephone Encounter (Signed)
Called and left a voice message on patient's phone telling her that ideally, we would like to have her to ensure that she does have strep throat, but since she has been diagnosed with this many times in a similar year with the symptoms, I will empirically treat with penicillin 500 mg 3 times daily for 10 days.  Since the doxycycline that she is taking daily for her acne can reduce the effectiveness of penicillin, she should stop doxycycline while she is taking the penicillin.  I will place the referral to ENT per her request.  Encouraged her to call me if she has any questions.

## 2020-01-30 ENCOUNTER — Ambulatory Visit: Payer: Medicaid Other | Admitting: Gastroenterology

## 2020-01-31 ENCOUNTER — Telehealth: Payer: Medicaid Other | Admitting: Family Medicine

## 2020-02-01 ENCOUNTER — Telehealth (INDEPENDENT_AMBULATORY_CARE_PROVIDER_SITE_OTHER): Payer: Medicaid Other | Admitting: Family Medicine

## 2020-02-01 ENCOUNTER — Other Ambulatory Visit: Payer: Self-pay

## 2020-02-01 DIAGNOSIS — J069 Acute upper respiratory infection, unspecified: Secondary | ICD-10-CM | POA: Insufficient documentation

## 2020-02-01 DIAGNOSIS — L739 Follicular disorder, unspecified: Secondary | ICD-10-CM | POA: Diagnosis not present

## 2020-02-01 HISTORY — DX: Acute upper respiratory infection, unspecified: J06.9

## 2020-02-01 MED ORDER — MUPIROCIN 2 % EX OINT: 1.0000 "application " | TOPICAL_OINTMENT | Freq: Two times a day (BID) | CUTANEOUS | 0 refills | Status: DC

## 2020-02-01 MED ORDER — IPRATROPIUM BROMIDE 0.03 % NA SOLN: 2.0000 | Freq: Two times a day (BID) | NASAL | 0 refills | Status: DC

## 2020-02-01 NOTE — Assessment & Plan Note (Signed)
Likely upper respiratory virus, possibly Covid, although patient does not have classic symptoms of this.  When told that this could be a possibility, patient vehemently denied that she could have Covid.  We will continue her antihistamine and Flonase and add Atrovent nose spray for symptom control.  Patient was encouraged to wash her hands as frequently as possible and reassured that her symptoms should resolve with time.

## 2020-02-01 NOTE — Progress Notes (Signed)
[facility] Medicine Center Telemedicine Visit  Patient consented to have virtual visit. Method of visit: Telephone  Encounter participants: Patient: Kamariyah Timberlake - located at home Provider: Lennox Solders - located at [facility] Others (if applicable): none  Chief Complaint: Throat pain follow-up, folliculitis follow-up  HPI:  Throat pain Patient reports that her throat pain has improved, but she feels congested, has rhinorrhea, and her throat is "itchy."  She denies fever, cough, and shortness of breath.  She has been taking an antihistamine and using the Flonase without much relief.  She denies any sick contacts.  She has been continuing to go to class without issue.  Folliculitis Patient reports that she has a pimple in the area of her mons pubis that will get larger, decrease in size, then grow larger again.  This has been going on for several weeks.  It is similar to other boils she has had in the past but seems to have been present for a longer period of time. ROS: per HPI  Pertinent PMHx: Chronic fatigue, obesity  Exam:  Respiratory: No shortness of breath or cough during our conversation, patient was able to speak in full sentences without difficulty  Assessment/Plan:  Viral URI Likely upper respiratory virus, possibly Covid, although patient does not have classic symptoms of this.  When told that this could be a possibility, patient vehemently denied that she could have Covid.  We will continue her antihistamine and Flonase and add Atrovent nose spray for symptom control.  Patient was encouraged to wash her hands as frequently as possible and reassured that her symptoms should resolve with time.  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  Reassured that it is in her mons pubis region, so unlikely to be herpetic or inflamed Bartholin cyst.  Provided reassurance and gave patient a prescription for mupirocin.  Will follow up with me in 2 weeks if she has any concerns or earlier  if needed.    Time spent during visit with patient: 15 minutes

## 2020-02-01 NOTE — Assessment & Plan Note (Signed)
Acute on chronic.  Reassured that it is in her mons pubis region, so unlikely to be herpetic or inflamed Bartholin cyst.  Provided reassurance and gave patient a prescription for mupirocin.  Will follow up with me in 2 weeks if she has any concerns or earlier if needed.

## 2020-02-05 ENCOUNTER — Telehealth: Payer: Self-pay

## 2020-02-05 NOTE — Telephone Encounter (Signed)
LVM TO SCHEDULE SLEEP STUDY 

## 2020-02-12 DIAGNOSIS — H5213 Myopia, bilateral: Secondary | ICD-10-CM | POA: Diagnosis not present

## 2020-02-13 ENCOUNTER — Ambulatory Visit: Payer: Medicaid Other | Admitting: Gastroenterology

## 2020-02-13 DIAGNOSIS — H5213 Myopia, bilateral: Secondary | ICD-10-CM | POA: Diagnosis not present

## 2020-02-27 ENCOUNTER — Ambulatory Visit: Payer: Medicaid Other | Admitting: Gastroenterology

## 2020-03-13 ENCOUNTER — Other Ambulatory Visit: Payer: Self-pay

## 2020-03-13 ENCOUNTER — Ambulatory Visit (INDEPENDENT_AMBULATORY_CARE_PROVIDER_SITE_OTHER): Payer: Medicaid Other | Admitting: Family Medicine

## 2020-03-13 VITALS — BP 100/60 | HR 89 | Ht 66.0 in | Wt 297.1 lb

## 2020-03-13 DIAGNOSIS — R42 Dizziness and giddiness: Secondary | ICD-10-CM

## 2020-03-13 NOTE — Progress Notes (Signed)
    SUBJECTIVE:   CHIEF COMPLAINT / HPI:   Dizziness Started about three days ago Seems to be triggered by strong smells, sugary foods One day she felt dizzy when waking up, another day it seemed to occur randomly, although it was hot at the time Will sometimes feel dizzy when getting up from a seated position Tries to drink a good amount of water per day (3 bottles per day) No presyncope No tinnitus or hearing  No headaches  IUD Patient would like to know how long her IUD will last and what side effects it has  PERTINENT  PMH / PSH: Sleep deprivation, irregular menstrual bleeding, learning difficulty, prediabetes  OBJECTIVE:   BP 100/60   Pulse 89   Ht 5\' 6"  (1.676 m)   Wt 297 lb 2 oz (134.8 kg)   SpO2 98%   BMI 47.96 kg/m   General: well appearing, appears stated age Cardiac: RRR, no MRG Respiratory: CTAB, no rhonchi, rales, or wheezing, normal work of breathing Dix-Hallpike maneuver with some dizziness and leftward beating nystagmus  Orthostatic VS for the past 24 hrs (Last 3 readings):  BP- Lying Pulse- Lying BP- Sitting Pulse- Sitting BP- Standing at 0 minutes Pulse- Standing at 0 minutes  03/13/20 1428 100/62 93 100/56 99 (!) 80/56 92    ASSESSMENT/PLAN:   Dizziness Pretest probability for a life-threatening cause of dizziness is very low given this patient's young age.  She may have an element of BPPV given the results of the Dix-Hallpike maneuver, but I suspect that her dizziness is most likely due to orthostatic hypotension given her low blood pressure after standing.  I encouraged her to stay well-hydrated with low to no calorie fluids containing electrolytes.  I also referred her to vestibular rehab to see if this may help alleviate her symptoms.   I reassured the patient that the IUD would not cause her symptoms and that the most common side effect is irregular menstrual bleeding, although side effects are quite low overall.  03/15/20, MD [facility]  [facility]

## 2020-03-13 NOTE — Assessment & Plan Note (Signed)
Pretest probability for a life-threatening cause of dizziness is very low given this patient's young age.  She may have an element of BPPV given the results of the Dix-Hallpike maneuver, but I suspect that her dizziness is most likely due to orthostatic hypotension given her low blood pressure after standing.  I encouraged her to stay well-hydrated with low to no calorie fluids containing electrolytes.  I also referred her to vestibular rehab to see if this may help alleviate her symptoms.

## 2020-03-13 NOTE — Patient Instructions (Addendum)
It was nice seeing you today Lauren Obrien!  Your dizziness may be caused by a condition called benign positional paroxysmal vertigo, which happens when our bodies internal balance mechanism does not function normally.  Thankfully, this can be corrected with certain exercises.  I will refer you to vestibular rehab where they can work on this with you.  Please also continue to stay well-hydrated and eat small meals throughout the day to help reduce your dizziness.  If your dizziness becomes worse, we can consider a medicine to help reduce it.  Your Mirena IUD is good for 6 years after you got it placed, and its only side effect is changing your menstrual bleeding.  It does not contribute to dizziness.  If you have any questions or concerns, please feel free to call the clinic.   Be well,  Dr. Frances Furbish

## 2020-03-21 ENCOUNTER — Other Ambulatory Visit (HOSPITAL_COMMUNITY)
Admission: RE | Admit: 2020-03-21 | Discharge: 2020-03-21 | Disposition: A | Discharge status: Home / Self Care | Payer: Medicaid Other | Source: Ambulatory Visit | Attending: Neurology | Admitting: Neurology

## 2020-03-21 DIAGNOSIS — Z01812 Encounter for preprocedural laboratory examination: Secondary | ICD-10-CM | POA: Diagnosis not present

## 2020-03-21 DIAGNOSIS — Z20822 Contact with and (suspected) exposure to covid-19: Secondary | ICD-10-CM | POA: Diagnosis not present

## 2020-03-22 LAB — SARS CORONAVIRUS 2 (TAT 6-24 HRS): SARS Coronavirus 2: NEGATIVE

## 2020-03-24 ENCOUNTER — Ambulatory Visit (INDEPENDENT_AMBULATORY_CARE_PROVIDER_SITE_OTHER): Payer: Medicaid Other | Admitting: Neurology

## 2020-03-24 DIAGNOSIS — G4733 Obstructive sleep apnea (adult) (pediatric): Secondary | ICD-10-CM | POA: Diagnosis not present

## 2020-03-24 DIAGNOSIS — R5382 Chronic fatigue, unspecified: Secondary | ICD-10-CM

## 2020-03-24 DIAGNOSIS — G4719 Other hypersomnia: Secondary | ICD-10-CM

## 2020-03-24 NOTE — Progress Notes (Signed)
Negative corona swab test.

## 2020-03-27 DIAGNOSIS — J358 Other chronic diseases of tonsils and adenoids: Secondary | ICD-10-CM | POA: Diagnosis not present

## 2020-03-27 DIAGNOSIS — G4733 Obstructive sleep apnea (adult) (pediatric): Secondary | ICD-10-CM | POA: Diagnosis not present

## 2020-03-27 DIAGNOSIS — Z68.41 Body mass index (BMI) pediatric, greater than or equal to 95th percentile for age: Secondary | ICD-10-CM | POA: Diagnosis not present

## 2020-03-27 DIAGNOSIS — K219 Gastro-esophageal reflux disease without esophagitis: Secondary | ICD-10-CM | POA: Diagnosis not present

## 2020-03-29 NOTE — Procedures (Signed)
PATIENT'S NAME:  Lauren Obrien, Lauren Obrien DOB:      [birth year]      MR#:    [id number]     DATE OF RECORDING: 03/24/2020 Derrell Lolling REFERRING M.D.:  Joycelyn Schmid, MD Study Performed:   Titration to positive airway pressure  HISTORY:  Lauren Obrien is a 19 year- old Hispanic female patient with concerns of possibly sleep related headaches, obesity while being excessively daytime sleepy. She returns today after her baseline PSG from 12-30-2019 documented OSA at an AHI of 9.1/h, REM AHI of 49.7/h and supine AHI of 30/h.   The patient endorsed the Epworth Sleepiness Scale at 13/24 points. The patient's weight 292 pounds with a height of 66 (inches), resulting in a BMI of 46.8 kg/m2. The patient's neck circumference measured 16.5 inches.  CURRENT MEDICATIONS: none entered  PROCEDURE:  This is a multichannel digital polysomnogram utilizing the SomnoStar 11.2 system.  Electrodes and sensors were applied and monitored per AASM Specifications.   EEG, EOG, Chin and Limb EMG, were sampled at 200 Hz.  ECG, Snore and Nasal Pressure, Thermal Airflow, Respiratory Effort, CPAP Flow and Pressure, Oximetry was sampled at 50 Hz. Digital video and audio were recorded.       CPAP was initiated under a ResMed N 20 small sized nasal cushion mask- at 5 cmH20 with heated humidity per AASM split night standards and pressure was advanced to 8 cmH20 because of hypopneas, apneas and desaturations.  At a PAP pressure of 8 cmH20, there was a reduction of the AHI to 0/h with improvement of sleep apnea.  Lights Out was at 22:39 and Lights On at 05:00. Total recording time (TRT) was 377.5 minutes, with a total sleep time (TST) of 312 minutes. The patient's sleep latency was 56.5 minutes. REM latency was 128.5 minutes.  The sleep efficiency was 82.6 %.    SLEEP ARCHITECTURE: WASO (Wake after sleep onset) was 9 minutes.  There were 4 minutes in Stage N1, 185.5 minutes Stage N2, 72.5 minutes Stage N3 and 50 minutes in Stage REM.   The percentage of Stage N1 was 1.3%, Stage N2 was 59.5%, Stage N3 was 23.2% and Stage R (REM sleep) was 16.1%.   RESPIRATORY ANALYSIS:  There was a total of 0 respiratory events: 0 apneas and 0 hypopneas with 0 respiratory event related arousals (RERAs).     The total APNEA/HYPOPNEA INDEX  (AHI) was 0 /hour and the total RESPIRATORY DISTURBANCE INDEX was 0 /h. The patient spent 128 minutes of total sleep time in the supine position and 184 minutes in non-supine. The supine AHI was 0.0, versus a non-supine AHI of 0.0.  OXYGEN SATURATION & C02:  The baseline 02 saturation was 97%, with the lowest being 85%. Time spent below 89% saturation equaled 14 minutes.  The arousals were noted as: 32 were spontaneous, 0 were associated with PLMs, 0 were associated with respiratory events. The patient had a total of 0 Periodic Limb Movements.  Audio and video analysis did not show any abnormal or unusual movements, behaviors, phonations or vocalizations.  EKG was in keeping with normal sinus rhythm (NSR).  DIAGNOSIS Obstructive Sleep Apnea responded well to low pressures between 5 and 9 cm water, which controlled sleep apnea in supine and REM sleep as well. The patient was fitted with a ResMed N30 nasal cushion mask in small size.   PLANS/RECOMMENDATIONS: I ordered a ResMed autotitration machine with heated humidity, and a setting from 5-12 cm water with 1 cm EPR, as well  as a ResMed N30 nasal cushion mask in small size. 1.   DISCUSSION: A follow up appointment will be scheduled in the Sleep Clinic at [facility].   Please call [phone] with any questions.      I certify that I have reviewed the entire raw data recording prior to the issuance of this report in accordance with the Standards of Accreditation of the American Academy of Sleep Medicine (AASM)   Larey Seat, M.D. Diplomat, Tax adviser of Psychiatry and Neurology  Diplomat, Tax adviser of Sleep Medicine Research scientist (medical), [employer] [facility] [employer]

## 2020-03-29 NOTE — Addendum Note (Signed)
Addended by: Melvyn Novas on: 03/29/2020 07:16 PM   Modules accepted: Orders

## 2020-03-29 NOTE — Progress Notes (Signed)
DIAGNOSIS  Obstructive Sleep Apnea responded well to low pressures between 5  and 9 cm water, which controlled sleep apnea in supine and REM  sleep as well. The patient was fitted with a ResMed N30 nasal  cushion mask in small size.    PLANS/RECOMMENDATIONS:  I ordered a ResMed autotitration machine with heated humidity,  and a setting from 5-12 cm water with 1 cm EPR, as well as a  ResMed N30 nasal cushion mask in small size.  1.  DISCUSSION: A follow up appointment will be scheduled in the  Sleep Clinic at [facility].  Please call  [phone] with any questions.

## 2020-04-01 ENCOUNTER — Ambulatory Visit: Payer: Medicaid Other | Admitting: Gastroenterology

## 2020-04-02 ENCOUNTER — Ambulatory Visit (INDEPENDENT_AMBULATORY_CARE_PROVIDER_SITE_OTHER): Payer: Medicaid Other | Admitting: Family Medicine

## 2020-04-02 ENCOUNTER — Ambulatory Visit (INDEPENDENT_AMBULATORY_CARE_PROVIDER_SITE_OTHER)
Admission: RE | Admit: 2020-04-02 | Discharge: 2020-04-02 | Disposition: A | Discharge status: Home / Self Care | Payer: Medicaid Other | Source: Ambulatory Visit | Attending: Gastroenterology | Admitting: Gastroenterology

## 2020-04-02 ENCOUNTER — Encounter: Payer: Self-pay | Admitting: Gastroenterology

## 2020-04-02 ENCOUNTER — Encounter: Payer: Self-pay | Admitting: Family Medicine

## 2020-04-02 ENCOUNTER — Other Ambulatory Visit: Payer: Medicaid Other

## 2020-04-02 ENCOUNTER — Other Ambulatory Visit: Payer: Self-pay

## 2020-04-02 ENCOUNTER — Ambulatory Visit (INDEPENDENT_AMBULATORY_CARE_PROVIDER_SITE_OTHER): Payer: Medicaid Other | Admitting: Gastroenterology

## 2020-04-02 VITALS — BP 108/68 | HR 85 | Ht 66.0 in | Wt 302.6 lb

## 2020-04-02 VITALS — BP 100/62 | HR 84 | Ht 66.0 in | Wt 301.8 lb

## 2020-04-02 DIAGNOSIS — K219 Gastro-esophageal reflux disease without esophagitis: Secondary | ICD-10-CM

## 2020-04-02 DIAGNOSIS — Z6841 Body Mass Index (BMI) 40.0 and over, adult: Secondary | ICD-10-CM

## 2020-04-02 DIAGNOSIS — H5213 Myopia, bilateral: Secondary | ICD-10-CM | POA: Diagnosis not present

## 2020-04-02 DIAGNOSIS — R109 Unspecified abdominal pain: Secondary | ICD-10-CM | POA: Diagnosis not present

## 2020-04-02 DIAGNOSIS — K5900 Constipation, unspecified: Secondary | ICD-10-CM

## 2020-04-02 MED ORDER — POLYETHYLENE GLYCOL 3350 17 GM/SCOOP PO POWD: 1.0000 | Freq: Every day | ORAL | 3 refills | Status: DC

## 2020-04-02 MED ORDER — OMEPRAZOLE 40 MG PO CPDR
40.0000 mg | DELAYED_RELEASE_CAPSULE | Freq: Every day | ORAL | 3 refills | Status: DC
Start: 2020-04-02 — End: 2021-04-02

## 2020-04-02 NOTE — Progress Notes (Signed)
    SUBJECTIVE:   CHIEF COMPLAINT / HPI:   Weight gain Patient reports that she is unhappy with the amount of weight that she has gained recently.  She says that she was bullied in high school for being overweight, so she stopped caring about her appearance after that.  However, now she would like to lose some weight.  She says that she is frequently hungry, even an hour or two after she has eaten.  She also says that she hopes to continue to eat what she wants to eat well losing weight.  She does not get any specific exercise.  Her food intake for the last day includes sausage, eggs, sweet bread, and orange drink, lentils, rice, cheese.  She says that she does not like many vegetables.  She also has a 20-year-old son who is morbidly obese and is awaiting a return call from [place] [facility] for him after he was referred by me.   She was also recently diagnosed with prediabetes and sleep apnea.    PERTINENT  PMH / PSH: Prediabetes, morbid obesity, obstructive sleep apnea  OBJECTIVE:   BP 108/68   Pulse 85   Ht 5\' 6"  (1.676 m)   Wt (!) 302 lb 9.6 oz (137.3 kg)   SpO2 99%   BMI 48.84 kg/m   General: well appearing, appears stated age, morbidly obese Psych: appropriate mood and affect  ASSESSMENT/PLAN:   Class 3 severe obesity due to excess calories without serious comorbidity with body mass index (BMI) of 45.0 to 49.9 in adult [facility]) The patient and I have discussed her obesity and how it has contributed to her prediabetes and sleep apnea in the past.  Reviewed with patient some goals that she can work toward, such as reducing consumption of sugary beverages, using smaller plates during meals, and focusing on eating more vegetables.  However, when encouraging the patient to make her own goals, she was unable to do so.  She may be in the contemplative phase of change rather than the action phase currently.  She is interested in speaking with our nutritionist, so I can her [facility] contact  information and advised her to call her to set up an appointment.  Also advised her to continue calling Larae Grooms so that she can work with her son on dietary changes with them.     Cheryl Flash, MD [facility]

## 2020-04-02 NOTE — Progress Notes (Signed)
04/02/2020 Lauren Obrien [id number] 08-14-2001   HISTORY OF PRESENT ILLNESS: This is a 19 year old female who is new to our office.  She has been referred here by her PCP, Dr. Ardelia Mems, for evaluation regarding reflux related issues.  She describes burning in her stomach and acid reflux coming up into her chest.  Was diagnosed with H. pylori by serology in 2017, but treatment had to be postponed because she was breast-feeding at the time.  It appears that eventually she was treated, but by the PCPs notes they are unsure if she ever actually completed the treatment regimen.  She does not recall any of this.  Says that she has a bad memory.  She was recently told to take omeprazole 20 mg daily, but she says that she took it for a couple of days and then stopped it.  She also reports constipation.  Says she passes little pieces of hard stool.  Was also recommended that she take MiraLAX, but she has never taken that regularly.  She states that she feels like her stomach is "heavy".  She reports that she has a poor diet and is not good about eating salads, vegetables, etc.  CBC, CMP, TSH normal in 06/2019.  Past Medical History:  Diagnosis Date  . Acne vulgaris   . GERD (gastroesophageal reflux disease)   . Helicobacter pylori gastritis   . Hidradenitis suppurativa   . Neuropathy    BUE  . Pharyngitis 11/13/2019   No past surgical history on file.  reports that she has never smoked. She has never used smokeless tobacco. She reports that she does not drink alcohol or use drugs. family history includes Asthma in her maternal grandmother; Diabetes in her father, maternal grandfather, maternal grandmother, paternal grandfather, and paternal grandmother; Hypertension in her father. No Known Allergies    Outpatient Encounter Medications as of 04/02/2020  Medication Sig  . fluticasone (FLONASE) 50 MCG/ACT nasal spray Place 2 sprays into both nostrils daily. (Patient taking differently:  Place 2 sprays into both nostrils as needed. )  . levonorgestrel (MIRENA) 20 MCG/24HR IUD 1 each by Intrauterine route once.  Marland Kitchen ibuprofen (ADVIL) 400 MG tablet Take 1 tablet (400 mg total) by mouth every 4 (four) hours as needed for mild pain. (Patient not taking: Reported on 04/02/2020)  . omeprazole (PRILOSEC) 20 MG capsule Take 1 capsule (20 mg total) by mouth daily. (Patient not taking: Reported on 04/02/2020)  . [DISCONTINUED] Adapalene 0.3 % gel Apply 1 application topically at bedtime. (Patient not taking: Reported on 04/02/2020)  . [DISCONTINUED] doxycycline (VIBRA-TABS) 100 MG tablet Take 1 tablet (100 mg total) by mouth daily. (Patient not taking: Reported on 04/02/2020)  . [DISCONTINUED] ipratropium (ATROVENT) 0.03 % nasal spray Place 2 sprays into both nostrils every 12 (twelve) hours. (Patient not taking: Reported on 04/02/2020)  . [DISCONTINUED] loratadine (CLARITIN) 10 MG tablet Take 1 tablet (10 mg total) by mouth daily. (Patient not taking: Reported on 04/02/2020)  . [DISCONTINUED] mupirocin ointment (BACTROBAN) 2 % Place 1 application into the nose 2 (two) times daily. (Patient not taking: Reported on 04/02/2020)   No facility-administered encounter medications on file as of 04/02/2020.     REVIEW OF SYSTEMS  : All other systems reviewed and negative except where noted in the History of Present Illness.   PHYSICAL EXAM: BP 100/62   Pulse 84   Ht 5\' 6"  (1.676 m)   Wt (!) 301 lb 12.8 oz (136.9 kg)   SpO2 98%  BMI 48.71 kg/m  General: Well developed Hispanic female in no acute distress Head: Normocephalic and atraumatic Eyes:  Sclerae anicteric, conjunctiva pink. Ears: Normal auditory acuity Lungs: Clear throughout to auscultation; no increased WOB. Heart: Regular rate and rhythm; no M/R/G. Abdomen: Soft, non-distended.  BS present.  Non-tender. Musculoskeletal: Symmetrical with no gross deformities  Skin: No lesions on visible extremities Extremities: No edema    Neurological: Alert oriented x 4, grossly non-focal Psychological:  Alert and cooperative. Normal mood and affect  ASSESSMENT AND PLAN: *GERD and epigastric abdominal discomfort: Was positive for H. pylori by serology in 2017, but treatment had to be postponed because she was breast-feeding at the time.  It appears that she was then subsequently treated, but she does not recall this and from the PCP notes they are unsure if she ever really completed the treatment regimen.  We will perform H. pylori stool antigen study.  After returning the stool study then I would like her to start omeprazole 40 mg daily.  She needs to take this every day until she is seen back here again in the clinic in 4 to 6 weeks.  Prescription sent to her pharmacy. *Constipation: Chronic as well.  Reports that she has a poor diet.  Was told to take MiraLAX previously, but she did not take it for long.  I have asked her once again to begin taking MiraLAX every day until seen back here in 4 to 5 weeks.  She will mix this in 8 ounces of liquid daily.  Prescription sent to pharmacy at her request.  We will perform abdominal x-ray at her request to assess stool burden.   CC:  Latrelle Dodrill, MD

## 2020-04-02 NOTE — Progress Notes (Signed)
Assessment and plan noted ?

## 2020-04-02 NOTE — Patient Instructions (Addendum)
If you are age 20 or older, your body mass index should be between 23-30. Your Body mass index is 48.71 kg/m. If this is out of the aforementioned range listed, please consider follow up with your Primary Care Provider.  If you are age 33 or younger, your body mass index should be between 19-25. Your Body mass index is 48.71 kg/m. If this is out of the aformentioned range listed, please consider follow up with your Primary Care Provider.   Your provider has requested that you go to the basement level for lab work before leaving today. Press "B" on the elevator. The lab is located at the first door on the left as you exit the elevator.  Your provider has requested that you have an abdominal x ray before leaving today. Please go to the basement floor to our Radiology department for the test.  We have sent the following medications to your pharmacy for you to pick up at your convenience: Omeprazole 40 mg once daily 30-60 minutes before breakfast.   Follow up 6 weeks.

## 2020-04-03 ENCOUNTER — Telehealth: Payer: Self-pay | Admitting: Gastroenterology

## 2020-04-03 NOTE — Assessment & Plan Note (Signed)
The patient and I have discussed her obesity and how it has contributed to her prediabetes and sleep apnea in the past.  Reviewed with patient some goals that she can work toward, such as reducing consumption of sugary beverages, using smaller plates during meals, and focusing on eating more vegetables.  However, when encouraging the patient to make her own goals, she was unable to do so.  She may be in the contemplative phase of change rather than the action phase currently.  She is interested in speaking with our nutritionist, so I can her Dr. Larae Grooms contact information and advised her to call her to set up an appointment.  Also advised her to continue calling Cheryl Flash so that she can work with her son on dietary changes with them.

## 2020-04-03 NOTE — Telephone Encounter (Signed)
Patient states she did not call for directions on Miralax.

## 2020-04-03 NOTE — Telephone Encounter (Signed)
It was the pharmacy that called for this

## 2020-04-03 NOTE — Telephone Encounter (Signed)
Looking for directions on the Miralax medication

## 2020-04-04 LAB — H. PYLORI ANTIGEN, STOOL: H pylori Ag, Stl: NEGATIVE

## 2020-04-07 MED ORDER — POLYETHYLENE GLYCOL 3350 17 GM/SCOOP PO POWD: ORAL | 3 refills | Status: DC

## 2020-04-07 NOTE — Addendum Note (Signed)
Addended by: Mariane Duval on: 04/07/2020 10:01 AM   Modules accepted: Orders

## 2020-04-07 NOTE — Telephone Encounter (Signed)
Sent in new script to pharmacy for Miralax.

## 2020-04-08 ENCOUNTER — Telehealth: Payer: Self-pay | Admitting: Neurology

## 2020-04-08 NOTE — Telephone Encounter (Signed)
-----   Message from Melvyn Novas, MD sent at 03/29/2020  7:16 PM EDT ----- DIAGNOSIS  Obstructive Sleep Apnea responded well to low pressures between 5  and 9 cm water, which controlled sleep apnea in supine and REM  sleep as well. The patient was fitted with a ResMed N30 nasal  cushion mask in small size.    PLANS/RECOMMENDATIONS:  I ordered a ResMed autotitration machine with heated humidity,  and a setting from 5-12 cm water with 1 cm EPR, as well as a  ResMed N30 nasal cushion mask in small size.  1.  DISCUSSION: A follow up appointment will be scheduled in the  Sleep Clinic at [facility].  Please call  [phone] with any questions.

## 2020-04-08 NOTE — Telephone Encounter (Signed)
I called pt. I advised pt that Dr. Vickey Huger reviewed their sleep study results and found that pt was best treated with CPAP at a pressure of 8 cm water pressure. Dr. Vickey Huger recommends that pt starts auto CPAP. I reviewed PAP compliance expectations with the pt. Pt is agreeable to starting a CPAP. I advised pt that an order will be sent to a DME, [facility], and [facility] will call the pt within about one week after they file with the pt's insurance. [facility] will show the pt how to use the machine, fit for masks, and troubleshoot the CPAP if needed. A follow up appt will need to be made for insurance purposes with Dr. Vickey Huger or the nurse practitioner within 31-90 days after starting the machine. Pt was unable to schedule this at this time. Pt states that she would call back to schedule. A letter with all of this information in it will be mailed to the pt as a reminder. I verified with the pt that the address we have on file is correct. Pt verbalized understanding of results. Pt had no questions at this time but was encouraged to call back if questions arise. I have sent the order to [facility] and have received confirmation that they have received the order.

## 2020-04-15 ENCOUNTER — Ambulatory Visit: Payer: Medicaid Other | Admitting: Family Medicine

## 2020-04-22 ENCOUNTER — Other Ambulatory Visit: Payer: Self-pay

## 2020-04-22 MED ORDER — MUPIROCIN 2 % EX OINT
1.0000 | TOPICAL_OINTMENT | Freq: Two times a day (BID) | CUTANEOUS | 0 refills | Status: DC
Start: 2020-04-22 — End: 2020-06-25

## 2020-04-22 MED ORDER — MUPIROCIN 2 % EX OINT
1.0000 | TOPICAL_OINTMENT | Freq: Two times a day (BID) | CUTANEOUS | 0 refills | Status: DC
Start: 2020-04-22 — End: 2020-04-22

## 2020-04-22 NOTE — Addendum Note (Signed)
Addended by: Treylon Henard, [place] J on: 04/22/2020 10:57 PM   Modules accepted: Orders

## 2020-04-22 NOTE — Telephone Encounter (Signed)
Patient calls nurse line requesting refill on mupirocin skin ointment. She said she has received in the past in March of 2021. Medication not on current med list. Please advise if refill is appropriate or if patient should be evaluated.   To PCP  Veronda Prude, RN

## 2020-04-24 DIAGNOSIS — G4733 Obstructive sleep apnea (adult) (pediatric): Secondary | ICD-10-CM | POA: Diagnosis not present

## 2020-04-29 ENCOUNTER — Encounter: Payer: Self-pay | Admitting: Neurology

## 2020-05-14 ENCOUNTER — Ambulatory Visit: Payer: Self-pay | Admitting: Gastroenterology

## 2020-05-14 NOTE — Progress Notes (Signed)
    SUBJECTIVE:   CHIEF COMPLAINT / HPI: sore throat  URI symptoms Patient reports having sore throat, runny nose for about 2 days.  Initially started with a sore throat followed by runny nose, heavy eyes as well as photophobia.  She reports having a cough only when she has an itchy throat.  Denies any fevers, shortness of breath, chest pain, or decrease in appetite.  Denies any anosmia or ageusia.  She did not receive her Covid vaccines and currently does not want them.  She reports feeling of having mucus in the back of her throat.  She recently started using a CPAP machine for her sleep apnea.  She endorses feeling more fatigued than usual.  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  She reports contact with family members who have had some similar symptoms that have now resolved.  PERTINENT  PMH / PSH: Allergic rhinitis Sleep apnea GERD  OBJECTIVE:   BP 112/64   Pulse (!) 101   Ht 5\' 6"  (1.676 m)   SpO2 100%   BMI 48.71 kg/m    General: Alert and oriented, no apparent distress  Eyes: No conjunctival redness or discharge ENTM: No pharyengeal erythema, TMs visible bilaterally Neck: Mild tenderness over trapezius muscle, normal range of motion Cardiovascular: RRR with no murmurs noted Respiratory: CTA bilaterally    ASSESSMENT/PLAN:   Viral URI Symptoms likely secondary to viral upper respiratory infection given the patient was in recent contact with persons with similar symptoms that have resolved. -Rapid strep test negative -Restart Flonase nasal spray 2 sprays to each nare daily -Restart Prilosec daily, patient has GERD could be contributing factor to sore throat and cough. -Recommend Covid testing and Covid vaccinations -Follow-up with PCP as needed -Strict return precautions provided     , MD [facility]

## 2020-05-14 NOTE — Patient Instructions (Addendum)
Thank you for coming to see me today. It was a pleasure. Today we talked about:   I recommend you make an appointment to get COVID tested.  Your test for strep throat was negative.  Start using your Flonase nasal spray twice a day yo each nostril Continue your Prilosec daily  Please follow-up with your PCP in 1-2 weeks  If you have any questions or concerns, please do not hesitate to call the office at [phone].  Best,   Dana Allan, MD [facility]  Upper Respiratory Infection, Adult An upper respiratory infection (URI) affects the nose, throat, and upper air passages. URIs are caused by germs (viruses). The most common type of URI is often called "the common cold." Medicines cannot cure URIs, but you can do things at home to relieve your symptoms. URIs usually get better within 7-10 days. Follow these instructions at home: Activity  Rest as needed.  If you have a fever, stay home from work or school until your fever is gone, or until your doctor says you may return to work or school. ? You should stay home until you cannot spread the infection anymore (you are not contagious). ? Your doctor may have you wear a face mask so you have less risk of spreading the infection. Relieving symptoms  Gargle with a salt-water mixture 3-4 times a day or as needed. To make a salt-water mixture, completely dissolve -1 tsp of salt in 1 cup of warm water.  Use a cool-mist humidifier to add moisture to the air. This can help you breathe more easily. Eating and drinking   Drink enough fluid to keep your pee (urine) pale yellow.  Eat soups and other clear broths. General instructions   Take over-the-counter and prescription medicines only as told by your doctor. These include cold medicines, fever reducers, and cough suppressants.  Do not use any products that contain nicotine or tobacco. These include cigarettes and e-cigarettes. If you need help quitting, ask your  doctor.  Avoid being where people are smoking (avoid secondhand smoke).  Make sure you get regular shots and get the flu shot every year.  Keep all follow-up visits as told by your doctor. This is important. How to avoid spreading infection to others   Wash your hands often with soap and water. If you do not have soap and water, use hand sanitizer.  Avoid touching your mouth, face, eyes, or nose.  Cough or sneeze into a tissue or your sleeve or elbow. Do not cough or sneeze into your hand or into the air. Contact a doctor if:  You are getting worse, not better.  You have any of these: ? A fever. ? Chills. ? Brown or red mucus in your nose. ? Yellow or brown fluid (discharge)coming from your nose. ? Pain in your face, especially when you bend forward. ? Swollen neck glands. ? Pain with swallowing. ? White areas in the back of your throat. Get help right away if:  You have shortness of breath that gets worse.  You have very bad or constant: ? Headache. ? Ear pain. ? Pain in your forehead, behind your eyes, and over your cheekbones (sinus pain). ? Chest pain.  You have long-lasting (chronic) lung disease along with any of these: ? Wheezing. ? Long-lasting cough. ? Coughing up blood. ? A change in your usual mucus.  You have a stiff neck.  You have changes in your: ? Vision. ? Hearing. ? Thinking. ? Mood. Summary  An upper respiratory infection (URI) is caused by a germ called a virus. The most common type of URI is often called "the common cold."  URIs usually get better within 7-10 days.  Take over-the-counter and prescription medicines only as told by your doctor. This information is not intended to replace advice given to you by your health care provider. Make sure you discuss any questions you have with your health care provider. Document Revised: 11/09/2018 Document Reviewed: 06/24/2017 Elsevier Patient Education  2020 [employer].

## 2020-05-15 ENCOUNTER — Ambulatory Visit (INDEPENDENT_AMBULATORY_CARE_PROVIDER_SITE_OTHER): Payer: Medicaid Other | Admitting: Family Medicine

## 2020-05-15 ENCOUNTER — Other Ambulatory Visit: Payer: Self-pay

## 2020-05-15 ENCOUNTER — Ambulatory Visit: Payer: Self-pay | Admitting: Gastroenterology

## 2020-05-15 VITALS — BP 112/64 | HR 101 | Ht 66.0 in

## 2020-05-15 DIAGNOSIS — J069 Acute upper respiratory infection, unspecified: Secondary | ICD-10-CM

## 2020-05-15 DIAGNOSIS — J029 Acute pharyngitis, unspecified: Secondary | ICD-10-CM | POA: Diagnosis not present

## 2020-05-15 DIAGNOSIS — G4733 Obstructive sleep apnea (adult) (pediatric): Secondary | ICD-10-CM | POA: Diagnosis not present

## 2020-05-15 LAB — POCT RAPID STREP A (OFFICE): Rapid Strep A Screen: NEGATIVE

## 2020-05-19 ENCOUNTER — Encounter: Payer: Self-pay | Admitting: Family Medicine

## 2020-05-19 NOTE — Assessment & Plan Note (Signed)
Symptoms likely secondary to viral upper respiratory infection given the patient was in recent contact with persons with similar symptoms that have resolved. -Rapid strep test negative -Restart Flonase nasal spray 2 sprays to each nare daily -Restart Prilosec daily, patient has GERD could be contributing factor to sore throat and cough. -Recommend Covid testing and Covid vaccinations -Follow-up with PCP as needed -Strict return precautions provided

## 2020-05-28 NOTE — Progress Notes (Signed)
    SUBJECTIVE:   CHIEF COMPLAINT / HPI: Sore throat vaginal discharge  Sore throat Patient was seen 07/01 for symptoms of sore throat. Rapid strep negative at the time. Patient was prescribed Flonase and omeprazole. Reports that while using Flonase started to get better but she stopped as symptoms were improving. She did not start omeprazole. She continues to have sore throat mostly when lying down. Denies any fevers, shortness of breath or increased work of breathing.  Vaginal itching Patient reports vaginal itching. She reports some increase in vaginal discharge but difficult for her to see. No vaginal bleeding. Currently on the pill for birth control. Denies any urinary symptoms.  PERTINENT  PMH / PSH:  Allergic rhinitis GERD Oropharyngeal dysphagia  OBJECTIVE:   BP 106/80   Pulse 90   SpO2 98%    General: Alert and oriented, no apparent distress  ENTM: No pharyengeal erythema, mucous membranes moist Neck: nontender, no lymphadenopathy Cardiovascular: RRR with no murmurs noted Respiratory: CTA bilaterally  Gastrointestinal: Bowel sounds present. No abdominal pain EVE: No lesions or abrasions noted SVE: Cervix normal, normal vaginal wall mucosa, small amount of white discharge in vaginal vault Bimanual: No CMT or adnexal masses appreciated  ASSESSMENT/PLAN:   Acid reflux Sore throat likely secondary to acid reflux. Patient has not been compliant with omeprazole. Appointment with GI in August for oropharyngeal dysphagia -Continue omeprazole 20 mg twice daily -Educated on GERD -Follow-up with PCP as needed  Vaginal itching Small amount of white discharge noted in vaginal vault likely secondary to Candida infection -Wet mount positive for Candida -Diflucan 150 mg p.o. x1. -Follow-up with PCP as needed      Dana Allan, MD [facility]

## 2020-05-28 NOTE — Patient Instructions (Addendum)
Thank you for coming to see me today. It was a pleasure.  Start Prilosec to help reduces symptoms I will MyChart you with the results of your vaginal test and let you know if you need treatment.  Continue your Flonase as directed You can use honey to help relieve with your sore throat.  Please follow-up with your PCP in 2-3 weeks  If you have any questions or concerns, please do not hesitate to call the office at [phone].  Best,  Dana Allan, MD Family Medicine Residency    Gastroesophageal Reflux Disease, Adult Gastroesophageal reflux (GER) happens when acid from the stomach flows up into the tube that connects the mouth and the stomach (esophagus). Normally, food travels down the esophagus and stays in the stomach to be digested. With GER, food and stomach acid sometimes move back up into the esophagus. You may have a disease called gastroesophageal reflux disease (GERD) if the reflux:  Happens often.  Causes frequent or very bad symptoms.  Causes problems such as damage to the esophagus. When this happens, the esophagus becomes sore and swollen (inflamed). Over time, GERD can make small holes (ulcers) in the lining of the esophagus. What are the causes? This condition is caused by a problem with the muscle between the esophagus and the stomach. When this muscle is weak or not normal, it does not close properly to keep food and acid from coming back up from the stomach. The muscle can be weak because of:  Tobacco use.  Pregnancy.  Having a certain type of hernia (hiatal hernia).  Alcohol use.  Certain foods and drinks, such as coffee, chocolate, onions, and peppermint. What increases the risk? You are more likely to develop this condition if you:  Are overweight.  Have a disease that affects your connective tissue.  Use NSAID medicines. What are the signs or symptoms? Symptoms of this condition include:  Heartburn.  Difficult or painful swallowing.  The  feeling of having a lump in the throat.  A bitter taste in the mouth.  Bad breath.  Having a lot of saliva.  Having an upset or bloated stomach.  Belching.  Chest pain. Different conditions can cause chest pain. Make sure you see your doctor if you have chest pain.  Shortness of breath or noisy breathing (wheezing).  Ongoing (chronic) cough or a cough at night.  Wearing away of the surface of teeth (tooth enamel).  Weight loss. How is this treated? Treatment will depend on how bad your symptoms are. Your doctor may suggest:  Changes to your diet.  Medicine.  Surgery. Follow these instructions at home: Eating and drinking   Follow a diet as told by your doctor. You may need to avoid foods and drinks such as: ? Coffee and tea (with or without caffeine). ? Drinks that contain alcohol. ? Energy drinks and sports drinks. ? Bubbly (carbonated) drinks or sodas. ? Chocolate and cocoa. ? Peppermint and mint flavorings. ? Garlic and onions. ? Horseradish. ? Spicy and acidic foods. These include peppers, chili powder, curry powder, vinegar, hot sauces, and BBQ sauce. ? Citrus fruit juices and citrus fruits, such as oranges, lemons, and limes. ? Tomato-based foods. These include red sauce, chili, salsa, and pizza with red sauce. ? Fried and fatty foods. These include donuts, french fries, potato chips, and high-fat dressings. ? High-fat meats. These include hot dogs, rib eye steak, sausage, ham, and bacon. ? High-fat dairy items, such as whole milk, butter, and cream cheese.  Eat small meals often. Avoid eating large meals.  Avoid drinking large amounts of liquid with your meals.  Avoid eating meals during the 2-3 hours before bedtime.  Avoid lying down right after you eat.  Do not exercise right after you eat. Lifestyle   Do not use any products that contain nicotine or tobacco. These include cigarettes, e-cigarettes, and chewing tobacco. If you need help quitting,  ask your doctor.  Try to lower your stress. If you need help doing this, ask your doctor.  If you are overweight, lose an amount of weight that is healthy for you. Ask your doctor about a safe weight loss goal. General instructions  Pay attention to any changes in your symptoms.  Take over-the-counter and prescription medicines only as told by your doctor. Do not take aspirin, ibuprofen, or other NSAIDs unless your doctor says it is okay.  Wear loose clothes. Do not wear anything tight around your waist.  Raise (elevate) the head of your bed about 6 inches (15 cm).  Avoid bending over if this makes your symptoms worse.  Keep all follow-up visits as told by your doctor. This is important. Contact a doctor if:  You have new symptoms.  You lose weight and you do not know why.  You have trouble swallowing or it hurts to swallow.  You have wheezing or a cough that keeps happening.  Your symptoms do not get better with treatment.  You have a hoarse voice. Get help right away if:  You have pain in your arms, neck, jaw, teeth, or back.  You feel sweaty, dizzy, or light-headed.  You have chest pain or shortness of breath.  You throw up (vomit) and your throw-up looks like blood or coffee grounds.  You pass out (faint).  Your poop (stool) is bloody or black.  You cannot swallow, drink, or eat. Summary  If a person has gastroesophageal reflux disease (GERD), food and stomach acid move back up into the esophagus and cause symptoms or problems such as damage to the esophagus.  Treatment will depend on how bad your symptoms are.  Follow a diet as told by your doctor.  Take all medicines only as told by your doctor. This information is not intended to replace advice given to you by your health care provider. Make sure you discuss any questions you have with your health care provider. Document Revised: 05/10/2018 Document Reviewed: 05/10/2018 Elsevier Patient Education  2020  [employer].

## 2020-05-29 ENCOUNTER — Ambulatory Visit (INDEPENDENT_AMBULATORY_CARE_PROVIDER_SITE_OTHER): Payer: Medicaid Other | Admitting: Family Medicine

## 2020-05-29 ENCOUNTER — Other Ambulatory Visit (HOSPITAL_COMMUNITY)
Admission: RE | Admit: 2020-05-29 | Discharge: 2020-05-29 | Disposition: A | Discharge status: Home / Self Care | Payer: Medicaid Other | Source: Ambulatory Visit | Attending: Family Medicine | Admitting: Family Medicine

## 2020-05-29 ENCOUNTER — Other Ambulatory Visit: Payer: Self-pay

## 2020-05-29 VITALS — BP 106/80 | HR 90

## 2020-05-29 DIAGNOSIS — N898 Other specified noninflammatory disorders of vagina: Secondary | ICD-10-CM

## 2020-05-29 DIAGNOSIS — K219 Gastro-esophageal reflux disease without esophagitis: Secondary | ICD-10-CM

## 2020-05-29 LAB — POCT WET PREP (WET MOUNT)
Clue Cells Wet Prep Whiff POC: NEGATIVE
Trichomonas Wet Prep HPF POC: ABSENT

## 2020-05-29 MED ORDER — FLUCONAZOLE 150 MG PO TABS
150.0000 mg | ORAL_TABLET | Freq: Once | ORAL | 0 refills | Status: AC
Start: 2020-05-29 — End: 2020-05-29

## 2020-05-30 ENCOUNTER — Ambulatory Visit: Payer: Medicaid Other | Admitting: Family Medicine

## 2020-06-02 LAB — CERVICOVAGINAL ANCILLARY ONLY
Chlamydia: NEGATIVE
Comment: NEGATIVE
Comment: NORMAL
Neisseria Gonorrhea: NEGATIVE

## 2020-06-03 ENCOUNTER — Encounter: Payer: Self-pay | Admitting: Family Medicine

## 2020-06-03 DIAGNOSIS — N898 Other specified noninflammatory disorders of vagina: Secondary | ICD-10-CM | POA: Insufficient documentation

## 2020-06-03 NOTE — Assessment & Plan Note (Signed)
Small amount of white discharge noted in vaginal vault likely secondary to Candida infection -Wet mount positive for Candida -Diflucan 150 mg p.o. x1. -Follow-up with PCP as needed

## 2020-06-03 NOTE — Assessment & Plan Note (Signed)
Sore throat likely secondary to acid reflux. Patient has not been compliant with omeprazole. Appointment with GI in August for oropharyngeal dysphagia -Continue omeprazole 20 mg twice daily -Educated on GERD -Follow-up with PCP as needed

## 2020-06-04 NOTE — Telephone Encounter (Signed)
Error. Ambrea Hegler, CMA  

## 2020-06-06 ENCOUNTER — Encounter: Payer: Self-pay | Admitting: Family Medicine

## 2020-06-06 ENCOUNTER — Ambulatory Visit (INDEPENDENT_AMBULATORY_CARE_PROVIDER_SITE_OTHER): Payer: Medicaid Other | Admitting: Family Medicine

## 2020-06-06 ENCOUNTER — Other Ambulatory Visit: Payer: Self-pay

## 2020-06-06 VITALS — BP 116/62 | HR 81 | Ht 65.0 in | Wt 305.0 lb

## 2020-06-06 DIAGNOSIS — M778 Other enthesopathies, not elsewhere classified: Secondary | ICD-10-CM | POA: Diagnosis not present

## 2020-06-06 DIAGNOSIS — L739 Follicular disorder, unspecified: Secondary | ICD-10-CM | POA: Diagnosis not present

## 2020-06-06 DIAGNOSIS — L700 Acne vulgaris: Secondary | ICD-10-CM | POA: Diagnosis not present

## 2020-06-06 MED ORDER — CETIRIZINE HCL 10 MG PO TABS: 10.0000 mg | ORAL_TABLET | Freq: Every day | ORAL | 11 refills | Status: DC | PRN

## 2020-06-06 NOTE — Assessment & Plan Note (Signed)
Patient is requesting Derm referral for her acne which she said has gotten worse on her face but is also on her back.  Patient reports that she had a referral in the past but that she was called and never returned the call.  Would like a second referral.  Unsure if it has been within a year or not.

## 2020-06-06 NOTE — Progress Notes (Signed)
    SUBJECTIVE:   CHIEF COMPLAINT / HPI:   Wrist pain  Patient reports pain in her right wrist intermittently.  Patient reports she has been told she had carpal tunnel in her left wrist and feels like it may be the same in her right wrist.  Is requesting a splint for her right wrist if we have one available.  Has not tried any medications for her pain.  Patient reports it is not hurting at this time and that it usually improves with rest.  Sore throat  Patient reports chronic sore throat for which she attributes to postnasal drip after wearing her CPAP.  Patient has been prescribed Flonase in the past but she says that she does not use it and is unsure why.  Has not tried any other medications.  Acne vulgaris Patient reports continued acne on her face, back, groin area.  She reports infected hair follicles in her groin which are bothering her and extremely painful.  She has been putting some kind of ointment on them but is unsure of the type.  Patient reports she has had problems with this in the past.  OBJECTIVE:   BP (!) 116/62   Pulse 81   Ht 5\' 5"  (1.651 m)   Wt (!) 305 lb (138.3 kg)   SpO2 98%   BMI 50.75 kg/m   General: 19 year old female, sitting in chair next to exam table Respiratory: Normal work of breathing Derm: Severe acne with scarring on face, back GU: Patient has multiple inflamed hair follicles in her groin with some that are healing after being ruptured.  ASSESSMENT/PLAN:   Right wrist tendinitis Patient currently having issues with right wrist pain which she reports is similar to the pain in her left wrist.  Previously diagnosed with left wrist tendinitis consistent with carpal tunnel syndrome.  This is also appears to be occurring in her right wrist.  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  She has not tried any medications for this pain.  Reports rest helps. -Discussed the importance of rest, ice, ibuprofen or Tylenol.  Recommended  Tylenol arthritic pain because of the extended release effects. 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  Patient has been trying a cream but is unsure of which type it is.  Discussed some topical ointments but do not want to prescribe something unless we are sure of what she is currently using.  Exam showed large amount of moisture with some healing wounds.  Also discussed making sure it is kept clean and dry and recommended medicated powders such as Goldbond.  Acne vulgaris Patient is requesting Derm referral for her acne which she said has gotten worse on her face but is also on her back.  Patient reports that she had a referral in the past but that she was called and never returned the call.  Would like a second referral.  Unsure if it has been within a year or not.     Archivist, MD [facility]

## 2020-06-06 NOTE — Assessment & Plan Note (Signed)
Patient currently having issues with right wrist pain which she reports is similar to the pain in her left wrist.  Previously diagnosed with left wrist tendinitis consistent with carpal tunnel syndrome.  This is also appears to be occurring in her right wrist.  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  She has not tried any medications for this pain.  Reports rest helps. -Discussed the importance of rest, ice, ibuprofen or Tylenol.  Recommended Tylenol arthritic pain because of the extended release effects. -We will follow-up at next visit

## 2020-06-06 NOTE — Patient Instructions (Addendum)
It was a pleasure to meet you today.  Regarding your wrist pain I recommend trying ibuprofen or Tylenol, especially the Tylenol arthritic pain.  If you notice swelling, worsening pain without relief we can reassess this.  You can also try wrist braces.  Regarding your sore throat I have sent a prescription for Zyrtec but I highly encourage you to use your Flonase as well.  Regarding your acne we have tried multiple medications for this and I know that you would like to see a dermatologist.  I have sent a referral for that dermatologist and.  It may take a week to 2 weeks for someone to call you to schedule the appointment.  If you notice any worsening signs or symptoms please feel free to call our clinic and we can also reassess this.  I hope you have a wonderful afternoon!    Carpal Tunnel Syndrome  Carpal tunnel syndrome is a condition that causes pain in your hand and arm. The carpal tunnel is a narrow area that is on the palm side of your wrist. Repeated wrist motion or certain diseases may cause swelling in the tunnel. This swelling can pinch the main nerve in the wrist (median nerve). What are the causes? This condition may be caused by:  Repeated wrist motions.  Wrist injuries.  Arthritis.  A sac of fluid (cyst) or abnormal growth (tumor) in the carpal tunnel.  Fluid buildup during pregnancy. Sometimes the cause is not known. What increases the risk? The following factors may make you more likely to develop this condition:  Having a job in which you move your wrist in the same way many times. This includes jobs like being a Midwife or a Conservation officer, nature.  Being a woman.  Having other health conditions, such as: ? Diabetes. ? Obesity. ? A thyroid gland that is not active enough (hypothyroidism). ? Kidney failure. What are the signs or symptoms? Symptoms of this condition include:  A tingling feeling in your fingers.  Tingling or a loss of feeling (numbness) in your hand.  Pain in  your entire arm. This pain may get worse when you bend your wrist and elbow for a long time.  Pain in your wrist that goes up your arm to your shoulder.  Pain that goes down into your palm or fingers.  A weak feeling in your hands. You may find it hard to grab and hold items. You may feel worse at night. How is this diagnosed? This condition is diagnosed with a medical history and physical exam. You may also have tests, such as:  Electromyogram (EMG). This test checks the signals that the nerves send to the muscles.  Nerve conduction study. This test checks how well signals pass through your nerves.  Imaging tests, such as X-rays, ultrasound, and MRI. These tests check for what might be the cause of your condition. How is this treated? This condition may be treated with:  Lifestyle changes. You will be asked to stop or change the activity that caused your problem.  Doing exercise and activities that make bones and muscles stronger (physical therapy).  Learning how to use your hand again (occupational therapy).  Medicines for pain and swelling (inflammation). You may have injections in your wrist.  A wrist splint.  Surgery. Follow these instructions at home: If you have a splint:  Wear the splint as told by your doctor. Remove it only as told by your doctor.  Loosen the splint if your fingers: ? Tingle. ? Lose  feeling (become numb). ? Turn cold and blue.  Keep the splint clean.  If the splint is not waterproof: ? Do not let it get wet. ? Cover it with a watertight covering when you take a bath or a shower. Managing pain, stiffness, and swelling   If told, put ice on the painful area: ? If you have a removable splint, remove it as told by your doctor. ? Put ice in a plastic bag. ? Place a towel between your skin and the bag. ? Leave the ice on for 20 minutes, 2-3 times per day. General instructions  Take over-the-counter and prescription medicines only as told by  your doctor.  Rest your wrist from any activity that may cause pain. If needed, talk with your boss at work about changes that can help your wrist heal.  Do any exercises as told by your doctor, physical therapist, or occupational therapist.  Keep all follow-up visits as told by your doctor. This is important. Contact a doctor if:  You have new symptoms.  Medicine does not help your pain.  Your symptoms get worse. Get help right away if:  You have very bad numbness or tingling in your wrist or hand. Summary  Carpal tunnel syndrome is a condition that causes pain in your hand and arm.  It is often caused by repeated wrist motions.  Lifestyle changes and medicines are used to treat this problem. Surgery may help in very bad cases.  Follow your doctor's instructions about wearing a splint, resting your wrist, keeping follow-up visits, and calling for help. This information is not intended to replace advice given to you by your health care provider. Make sure you discuss any questions you have with your health care provider. Document Revised: 03/10/2018 Document Reviewed: 03/10/2018 Elsevier Patient Education  2020 [employer].

## 2020-06-06 NOTE — Assessment & Plan Note (Addendum)
Patient complaining of recurrent folliculitis on mons pubis region.  Patient has been trying a cream but is unsure of which type it is.  Discussed some topical ointments but do not want to prescribe something unless we are sure of what she is currently using.  Exam showed large amount of moisture with some healing wounds.  Also discussed making sure it is kept clean and dry and recommended medicated powders such as Goldbond.

## 2020-06-09 NOTE — Addendum Note (Signed)
Addended by: Celedonio Savage on: 06/09/2020 08:37 AM   Modules accepted: Orders

## 2020-06-15 DIAGNOSIS — G4733 Obstructive sleep apnea (adult) (pediatric): Secondary | ICD-10-CM | POA: Diagnosis not present

## 2020-06-17 ENCOUNTER — Encounter: Payer: Self-pay | Admitting: Nurse Practitioner

## 2020-06-17 ENCOUNTER — Ambulatory Visit (INDEPENDENT_AMBULATORY_CARE_PROVIDER_SITE_OTHER): Payer: Medicaid Other | Admitting: Nurse Practitioner

## 2020-06-17 VITALS — BP 118/78 | HR 86 | Wt 305.0 lb

## 2020-06-17 DIAGNOSIS — K219 Gastro-esophageal reflux disease without esophagitis: Secondary | ICD-10-CM

## 2020-06-17 DIAGNOSIS — K5900 Constipation, unspecified: Secondary | ICD-10-CM

## 2020-06-17 NOTE — Progress Notes (Signed)
IMPRESSION and PLAN:     Lauren Obrien is a 19 y.o. female with a PMH signficant for, but not necessarily limited to,  Obesity, GERD, H.pylori, sleep apnea  # GERD with pyrosis and regurgitation.  --Took Prilosec for a month and it helped regurgitation but she cannot remember if it helped the pyrosis. Out of Prilosec for several weeks.    --Resume Prilosec 40 mg q am before breakfast. She will take note if pyrosis improves with prilosec. Stay on prilosec until follow up visit.  --Follow up with me in 3-4 weeks.   # Constipation with hard and infrequent BMs.  --She didn't start the Miralax recommended at last visit. She has only one bathroom at home and was scared to have diarrhea. Mother takes Miralax and goes to the bathroom a lot.   --I recommended trying the miralax 1/2 capful daily in water.  If ineffective then increase to 1 capful daily  --Increase water intake to 8 glasses water daily ( currently drinking 24 ounces daily)   HPI:    Primary GI: Yancey Flemings, MD    Chief complaint : follow up on GERD and constipation  This patient is a 19 year old female last seen by Doug Sou, PA on 04/02/2020 for evaluation of GERD and constipation.  She had been diagnosed with H. pylori in 2017.  It was not clear if she completed treatment for it.  Plan was to check H. pylori stool study then start omeprazole 40 mg daily and return for follow up in 4 to 6 weeks.  For constipation she was asked to take MiraLAX every day.  No significant amount of stool found on KUB     INTERVAL HISTORY:   Patient is here for follow up from last visit. Patient didn't recall completing the H.pylori stool test, says we told her it wasn't needed after all.  However, I found results and it was negative. Patient says she has a bad memory.   Lauren Obrien took Prilosec for a month with improvement in regurgitation.  Ran out of the PPI in a month, didn't restart it. So far hasn't had any recurrent regurgitation.  Still having epigastric burning , especially on empty stomach. No nausea / vomiting. Takes Ibuprofen about twice a month for headache. She isn't sure if Prilosec helped with the burning. She sometimes gets burning in throat with spicy or certain salty foods. She cannot remember if Prilosec helped with the burning in her throat.     Again, she says her memory is bad.   Review of systems:     No chest pain, no SOB, no fevers, no urinary sx   Past Medical History:  Diagnosis Date  . Acne vulgaris   . GERD (gastroesophageal reflux disease)   . Helicobacter pylori gastritis   . Hidradenitis suppurativa   . Neuropathy    BUE  . Pharyngitis 11/13/2019    Patient's surgical history, family medical history, social history, medications and allergies were all reviewed in Epic   Creatinine clearance cannot be calculated (Patient's most recent lab result is older than the maximum 21 days allowed.)  Current Outpatient Medications  Medication Sig Dispense Refill  . cetirizine (ZYRTEC) 10 MG tablet Take 1 tablet (10 mg total) by mouth daily as needed for allergies. 30 tablet 11  . fluticasone (FLONASE) 50 MCG/ACT nasal spray Place 2 sprays into both nostrils daily. (Patient taking differently: Place 2 sprays into both nostrils as needed. ) 16 g 6  .  ibuprofen (ADVIL) 400 MG tablet Take 1 tablet (400 mg total) by mouth every 4 (four) hours as needed for mild pain. 30 tablet 0  . levonorgestrel (MIRENA) 20 MCG/24HR IUD 1 each by Intrauterine route once.    . mupirocin ointment (BACTROBAN) 2 % Apply 1 application topically 2 (two) times daily. 22 g 0  . omeprazole (PRILOSEC) 40 MG capsule Take 1 capsule (40 mg total) by mouth daily. 30 capsule 3  . polyethylene glycol powder (GLYCOLAX/MIRALAX) 17 GM/SCOOP powder Take one capful once daily. 255 g 3   No current facility-administered medications for this visit.    Filed Weights   06/17/20 1501  Weight: (!) 305 lb (138.3 kg)    Physical Exam:      BP 118/78 (BP Location: Right Arm, Patient Position: Sitting, Cuff Size: Normal) Comment (Cuff Size): forearm  Pulse 86   Wt (!) 305 lb (138.3 kg)   BMI 50.75 kg/m   GENERAL:  Pleasant female in NAD PSYCH: : Cooperative CARDIAC:  RRR PULM: Normal respiratory effort, lungs CTA bilaterally, no wheezing ABDOMEN:  Nondistended, soft, nontender. No obvious masses, no hepatomegaly,  normal bowel sounds SKIN:  turgor, no lesions seen Musculoskeletal:  Normal muscle tone, normal strength NEURO: Alert and oriented x 3, no focal neurologic deficits  I spent 30 minutes total reviewing records, obtaining history, performing exam, counseling patient and documenting visit / findings.   Willette Cluster , NP 06/17/2020, 3:20 PM

## 2020-06-17 NOTE — Patient Instructions (Signed)
Continue Omeprazole 40mg  - once daily 30 mins before breakfast.   Miralax- 1/2 capful daily in at least 8 ounces of water , if ineffective then increase to 1 capful daily.   Stay on Omeprazole and Miralax until your next visit.   If you are age 20 or older, your body mass index should be between 23-30. Your Body mass index is 50.75 kg/m. If this is out of the aforementioned range listed, please consider follow up with your Primary Care Provider.  If you are age 19 or younger, your body mass index should be between 19-25. Your Body mass index is 50.75 kg/m. If this is out of the aformentioned range listed, please consider follow up with your Primary Care Provider.    Thank you for choosing me and [facility].  12-18-1970

## 2020-06-18 ENCOUNTER — Encounter: Payer: Self-pay | Admitting: Nurse Practitioner

## 2020-06-18 NOTE — Progress Notes (Signed)
Assessment and plan noted ?

## 2020-06-25 ENCOUNTER — Encounter: Payer: Self-pay | Admitting: Neurology

## 2020-06-25 ENCOUNTER — Ambulatory Visit: Payer: Medicaid Other | Admitting: Neurology

## 2020-06-25 ENCOUNTER — Other Ambulatory Visit: Payer: Self-pay

## 2020-06-25 DIAGNOSIS — G4733 Obstructive sleep apnea (adult) (pediatric): Secondary | ICD-10-CM | POA: Diagnosis not present

## 2020-06-25 DIAGNOSIS — R0683 Snoring: Secondary | ICD-10-CM | POA: Diagnosis not present

## 2020-06-25 DIAGNOSIS — Z6841 Body Mass Index (BMI) 40.0 and over, adult: Secondary | ICD-10-CM

## 2020-06-25 NOTE — Progress Notes (Signed)
SLEEP MEDICINE CLINIC    Provider:  Melvyn Novas, MD  Primary Care Physician:  Derrel Nip, MD [address] [place] [address]     Referring Provider: Derrel Nip, Md [address] [place],  [place] [address]          Chief Complaint according to patient   Patient presents with:    . New Patient (Initial Visit)     alone, cpap fu, pt states it is hard for to keep mask on at night, and too dry air = too much pressure.       HISTORY OF PRESENT ILLNESS:  Lauren Obrien is a 19 y.o. year old Hispanic  female patient seen here as a referral on 06/25/2020 . She is a primary patient of Dr. Marjory Lies.   Ms. Barbette Reichmann [place] is here today to follow-up on her to sleep studies at the beginning of her CPAP therapy she was first diagnosed with obstructive sleep apnea at a mild degree at present strong REM sleep dependence on 01-09-2020 CPAP titration followed on 5-10 21 she was fitted with a ResMed nasal cushion mask and small size.  She did well between 5 and 9 cm water pressure and was given an auto titration machine with heated humidity with a setting from 5-12 with 1 cm EPR.  She remained on the same nasal cushion mask.  In the meantime she has developed trouble with the dryness of the air but also with the feeling that there is too much pressure sometimes leaking out of the mouth.  She has changed the mask recently.  She has a 97% compliance for days and 85% compliance 4 hours with an average of 6 hours and 12 minutes, the settings have remained between 5 and 12 cmH2O now with 3 cm EPR and her AHI is 4.1 which is a good resolution of apnea she does not have any central apneas arising her residual apneas still are obstructive in nature the air leakage is above average the 95th percentile pressure is 11.6.  There have been days with very high apnea indices and other days where there is almost none.  Air leak and higher apnea index seem to correlate.  She sometimes lost  the mask at night . No longer feeling congested. Nasal drip continues. PCP has prescribed nasal spray Weight loss has not been addressed.   Needs a weight loss program.  Has an IUD. Hirsutism, possible PCOS.     Initial Consultation: Chief concern according to patient : possible sleep disorder.  She  has a past medical history of Acne vulgaris, GERD (gastroesophageal reflux disease), Helicobacter pylori gastritis, Hidradenitis suppurativa, Neuropathy, and Pharyngitis (11/13/2019).  The patient is morbidly obese- BMI 47 kg/m2.  Her headaches started 4 years ago, daily migraines at the time. It improved for a while- and returned when under stress, when under test situations. She described the headaches as arising from the temple, pressure, retro-orbital.  Rarely sharp sensation. Sometimes nauseated- but not related to headaches.  No olfactory triggers, but photophobia, phonophobia.   Family medical /sleep history: brother has OSA, untreated.    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 currently works part timeArt gallery manager. Pets are not present. Tobacco use; never - but father is a smoker-.  ETOH use : none , Caffeine intake in form of Coffee( none) Soda(  3 a week) Tea (none  ) or energy drinks..    Sleep habits are as follows: The patient's dinner time is between 5 PM. The patient goes to bed at 11-12 PM and continues to sleep for 6-8 hours,  Is restless, wakes rarely for1 bathroom break. She averages 6-7 hours of sleep at night.   The preferred sleep position is on her side, with the support of 1 pillow.  Dreams are reportedly present most night.   7.30-8 AM is the usual rise time. The patient wakes up 6.50 with an alarm.   She reports not feeling refreshed or restored in AM, with symptoms such as dry mouth, feeling tired, rarely morning headaches and residual fatigue.  Naps are taken  infrequently.  Review of Systems: Out of a complete 14 system review, the patient complains of only the following symptoms, and all other reviewed systems are negative.:  Fatigue, sleepiness , snoring, fragmented sleep, bruxism, sleep hygiene needs improvement.    Acne addressed soon .   Gaining weight .    How likely are you to doze in the following situations: 0 = not likely, 1 = slight chance, 2 = moderate chance, 3 = high chance   Sitting and Reading? Watching Television? Sitting inactive in a public place (theater or meeting)? As a passenger in a car for an hour without a break? Lying down in the afternoon when circumstances permit? Sitting and talking to someone? Sitting quietly after lunch without alcohol? In a car, while stopped for a few minutes in traffic?   Total =  7 - from13/ 24 points , CPAP improved.   FSS endorsed at 48/ 63 points.   Social History   Socioeconomic History  . Marital status: Single    Spouse name: Not on file  . Number of children: 1  . Years of education: 6812  . Highest education level: Not on file  Occupational History    Comment: [employer] student  Tobacco Use  . Smoking status: Never Smoker  . Smokeless tobacco: Never Used  Substance and Sexual Activity  . Alcohol use: No    Alcohol/week: 0.0 standard drinks  . Drug use: No  . Sexual activity: Not Currently    Comment: 5 months post-partum  Other Topics Concern  . Not on file  Social History Narrative       11th grade does not do well in school   Social Determinants of Health   Financial Resource Strain:   . Difficulty of Paying Living Expenses:   Food Insecurity:   . Worried About Programme researcher, broadcasting/film/videounning Out of Food in the Last Year:   . Baristaan Out of Food in the Last Year:   Transportation Needs:   . Freight forwarderLack of Transportation (Medical):   Marland Kitchen. Lack of Transportation (Non-Medical):   Physical Activity:   . Days of Exercise per Week:   . Minutes of Exercise per Session:   Stress:   . Feeling of  Stress :   Social Connections:   . Frequency of Communication with Friends and Family:   . Frequency of Social Gatherings with Friends and Family:   . Attends Religious Services:   . Active Member of Clubs or Organizations:   . Attends BankerClub or Organization Meetings:   Marland Kitchen. Marital Status:     Family History  Problem Relation Age of Onset  . Diabetes Father   . Hypertension Father   . Diabetes Paternal Grandmother   . Diabetes Paternal Grandfather   . Diabetes Maternal  Grandfather   . Asthma Maternal Grandmother   . Diabetes Maternal Grandmother     Past Medical History:  Diagnosis Date  . Acne vulgaris   . GERD (gastroesophageal reflux disease)   . Helicobacter pylori gastritis   . Hidradenitis suppurativa   . Neuropathy    BUE  . Pharyngitis 11/13/2019    No past surgical history on file.   Current Outpatient Medications on File Prior to Visit  Medication Sig Dispense Refill  . cetirizine (ZYRTEC) 10 MG tablet Take 1 tablet (10 mg total) by mouth daily as needed for allergies. 30 tablet 11  . fluticasone (FLONASE) 50 MCG/ACT nasal spray Place 2 sprays into both nostrils daily. (Patient taking differently: Place 2 sprays into both nostrils as needed. ) 16 g 6  . levonorgestrel (MIRENA) 20 MCG/24HR IUD 1 each by Intrauterine route once.    Marland Kitchen omeprazole (PRILOSEC) 40 MG capsule Take 1 capsule (40 mg total) by mouth daily. 30 capsule 3  . polyethylene glycol powder (GLYCOLAX/MIRALAX) 17 GM/SCOOP powder Take one capful once daily. 255 g 3   No current facility-administered medications on file prior to visit.    No Known Allergies  Physical exam:  Today's Vitals   06/25/20 1609  BP: 113/80  Pulse: 87  Weight: (!) 306 lb (138.8 kg)  Height: 5\' 6"  (1.676 m)   Body mass index is 49.39 kg/m.   Wt Readings from Last 3 Encounters:  06/25/20 (!) 306 lb (138.8 kg) (>99 %, Z= 2.90)*  06/17/20 (!) 305 lb (138.3 kg) (>99 %, Z= 2.89)*  06/06/20 (!) 305 lb (138.3 kg) (>99 %,  Z= 2.89)*   * Growth percentiles are based on CDC (Girls, 2-20 Years) data.     Ht Readings from Last 3 Encounters:  06/25/20 5\' 6"  (1.676 m) (75 %, Z= 0.67)*  06/06/20 5\' 5"  (1.651 m) (61 %, Z= 0.27)*  05/15/20 5\' 6"  (1.676 m) (75 %, Z= 0.67)*   * Growth percentiles are based on CDC (Girls, 2-20 Years) data.      General: The patient is awake, alert and appears not in acute distress. The patient is well groomed. Head: Normocephalic, atraumatic. Neck is supple. Mallampati 3,  neck circumference:16. 5 inches . Nasal airflow   patent.  Retrognathia is not  seen. Bruxism marks.  Dental status:  Cardiovascular:  Regular rate and cardiac rhythm by pulse,  without distended neck veins. Respiratory: Lungs are clear to auscultation.  Skin:  Without evidence of ankle edema, or rash. Trunk: The patient's posture is erect.   Neurologic exam : The patient is awake and alert, oriented to place and time.   Memory subjective described as intact.  Attention span & concentration ability appears normal.  Speech is fluent,  without  dysarthria, dysphonia or aphasia.  Mood and affect are appropriate.   Cranial nerves: no loss of smell or taste reported  Pupils are equal and briskly reactive to light.  No Diplopia. Visual fields by finger perimetry are intact. Hearing was intact to soft voice and finger rubbing.   tongue and uvula move midline.  Neck ROM : rotation, tilt and flexion extension were normal for age and shoulder shrug was symmetrical.    Motor exam:  Symmetric bulk, tone and ROM.   Normal tone without cog wheeling, symmetric grip strength .  Deep tendon reflexes: in the  upper and lower extremities are symmetric and intact.  Babinski response was deferred      OSA treatment with CPAP to  continue.  Weight loss referral.  bariatric clinic.  Suspected PCOS can be worked up by PCP or OB, GYN>    Her main problem is fatigue- not hypersomnia. She is describing also headaches and  numbness which I will not address as she has seen a primary neurologist for this. There are many risk factors, DM, Obesity, she has had e an elevated morning glucose, normal TSH and Vit B 12.   Rheumatological panel from 8-11 20202 was normal.  Her son is 72 years old and sleeps with her.   After spending a total time of 25  minutes face to face and additional time for physical and neurologic examination, review of laboratory studies,  personal review of imaging studies, reports and results of other testing and review of referral information / records as far as provided in visit, I have established the following assessments:   OSA treatment with CPAP to continue.  Weight loss referral.  bariatric clinic.  Suspected PCOS can be worked up by PCP or OB, GYN>    CC: I will share my notes with Dr Marjory Lies.   Electronically signed by: Melvyn Novas, MD 06/25/2020 4:29 PM  [facility] Board certified by The [employer] of Sleep Medicine and Diplomate of the [employer] of Sleep Medicine. Board certified In Neurology through the ABPN, Fellow of the [employer] of Neurology. Medical Director of [facility].

## 2020-06-25 NOTE — Patient Instructions (Signed)
  OSA treatment with CPAP to continue.  Weight loss referral.  bariatric clinic.   Suspected PCOS can be worked up by PCP or OB, GYN>

## 2020-07-07 ENCOUNTER — Telehealth: Payer: Self-pay

## 2020-07-07 NOTE — Telephone Encounter (Signed)
Patient LVM on nurse line regarding painful "pimples" in groin area and is requesting medication. Patient reports that pimples are draining and have a burning sensation. Returned call to patient. No answer, left HIPPA compliant VM for her to return call to office.   Veronda Prude, RN

## 2020-07-07 NOTE — Telephone Encounter (Signed)
Patient returned call to nurse line regarding "pimple" in groin area. Patient states that the area has "opened up" and she believes it is in the process of healing, however, it is very painful.   Patient denies odor, redness and is not sure what color the drainage was. Patient reports some warmth and bleeding from site. Patient states that the area burns a lot and is very painful. Patient was scheduled with ATC on Wednesday morning but states that she does not want to have to come in. Advised patient that we typically need to evaluate area before medications could be prescribed. Patient states that she has had this issue in the past and would like to not have to come in if possible.   To PCP  Veronda Prude, RN

## 2020-07-07 NOTE — Telephone Encounter (Signed)
Lauren Obrien,  This is a chronic issue but I feel like if she's having worsening drainage there's concern for infection and she needs to be seen. Could you call her and let her know that she needs to be seen To determine which medication of any is needed.  Thanks!

## 2020-07-08 NOTE — Telephone Encounter (Signed)
Called patient yesterday afternoon and advised keeping scheduled appointment.   Veronda Prude, RN

## 2020-07-09 ENCOUNTER — Ambulatory Visit: Payer: Medicaid Other

## 2020-07-16 ENCOUNTER — Ambulatory Visit: Payer: Medicaid Other | Admitting: Nurse Practitioner

## 2020-07-16 DIAGNOSIS — G4733 Obstructive sleep apnea (adult) (pediatric): Secondary | ICD-10-CM | POA: Diagnosis not present

## 2020-07-17 DIAGNOSIS — L700 Acne vulgaris: Secondary | ICD-10-CM | POA: Diagnosis not present

## 2020-08-15 DIAGNOSIS — G4733 Obstructive sleep apnea (adult) (pediatric): Secondary | ICD-10-CM | POA: Diagnosis not present

## 2020-08-21 ENCOUNTER — Ambulatory Visit: Payer: Medicaid Other | Admitting: Nurse Practitioner

## 2020-08-25 ENCOUNTER — Encounter: Payer: Self-pay | Admitting: Family Medicine

## 2020-08-25 ENCOUNTER — Ambulatory Visit (INDEPENDENT_AMBULATORY_CARE_PROVIDER_SITE_OTHER): Payer: Medicaid Other | Admitting: Family Medicine

## 2020-08-25 ENCOUNTER — Other Ambulatory Visit: Payer: Self-pay

## 2020-08-25 VITALS — BP 108/74 | HR 82 | Ht 66.0 in | Wt 305.2 lb

## 2020-08-25 DIAGNOSIS — L0293 Carbuncle, unspecified: Secondary | ICD-10-CM

## 2020-08-25 NOTE — Assessment & Plan Note (Addendum)
-   based on patient history, suspect ingrown hair with resulting infection, but currently no other signs of folliculitis or other lesions at this time, no signs of cellulitis or abscess - has PMH of hydradenitis suppurativa noted in her chart- on exam no sinus tracts noted and no lesions in other intertriginous areas, but could be mild case with her recurrent lesions - requested records from dermatology today, suspect she may have been started on doxycycline which would be a good treatment for both her acne and potential hydradenitis suppurativa - continue with avoidance of dull razors/shaving, consider tropical clindamycin to prevent recurrence

## 2020-08-25 NOTE — Patient Instructions (Signed)
It was wonderful to see you today.  Please bring ALL of your medications with you to every visit.   Today we talked about:  - Signing a records request to see what medication you have been started on by your dermatologist, we will wait to consider other medications until we know what it is - Discussed flu and COVID vaccines, you can call us anytime if you decide that you want them!   Thank you for choosing [facility].   Please call [phone] with any questions about today's appointment.  Please be sure to schedule follow up at the front  desk before you leave today.   Burley Saver, MD  Family Medicine

## 2020-08-25 NOTE — Progress Notes (Signed)
    SUBJECTIVE:   CHIEF COMPLAINT / HPI:   19 yo female who presents for acute concern of pimple in her groin. She notes it started last week, was very painful on Friday when she called to make an appointment, but it "popped" over the weekend and she was able to express a small amount of pus and now is no longer painful and healing, small amount of itching at site of lesion but she is trying not to touch it. She notes that this has gone on for several years. She did see the dermatologist at [facility] one month ago, they started her on a medication that she has picked up, cannot remember the name, but has not started taking yet. She states she was told she had to take it an hour before eating and not with milk, and wanted to call the dermatologist back to discuss this as this will be difficult for her to do with her schedule. We do not have records of this most recent visit. No systemic symptoms like fevers or chills. She has not been shaving for the past 3-4 months. She has been told in the past that these are ingrown hairs. She has acne but only gets these "boils" in her groin, denies lesions in her armpits or gluteal cleft.  PERTINENT  PMH / PSH: OSA, GERD, hidradenitis suppurativa? Noted in chart  OBJECTIVE:   BP 108/74   Pulse 82   Ht 5\' 6"  (1.676 m)   Wt (!) 305 lb 3.2 oz (138.4 kg)   SpO2 97%   BMI 49.26 kg/m   General: A&O, NAD HEENT: No sign of trauma, EOM grossly intact Respiratory: normal WOB GI: non-distended  Extremities: no peripheral edema. Neuro: Normal gait, moves all four extremities appropriately GU: small 0.5 cm healing ovular lesion on mons pubis, without drainage or surrounding erythema or fluctuance, no sinus tract appreciated or palpated, no other pustules or lesions appreciated in intertriginous area, no lesions/tracts appreciated in gluteal cleft Skin: acne with pustular comedones noted on face, chest, and shoulders Psych: Appropriate mood and  affect  ASSESSMENT/PLAN:   Recurrent boils - based on patient history, suspect ingrown hair with resulting infection, but currently no other signs of folliculitis or other lesions at this time, no signs of cellulitis or abscess - has PMH of hydradenitis suppurativa noted in her chart- on exam no sinus tracts noted and no lesions in other intertriginous areas, but could be mild case with her recurrent lesions - requested records from dermatology today, suspect she may have been started on doxycycline which would be a good treatment for both her acne and potential hydradenitis suppurativa - continue with avoidance of dull razors/shaving, consider tropical clindamycin to prevent recurrence    COVID and influenza vaccinations offered today, indications discussed, she has declined at this time. Discussed if she changes her mind can always call clinic.   , MD [facility]

## 2020-08-29 ENCOUNTER — Telehealth: Payer: Self-pay | Admitting: Family Medicine

## 2020-08-29 NOTE — Telephone Encounter (Signed)
Patient requesting school note for her carpel tunnel with accommodations. Patient states she's recently had more problems with this. She would like this letter sent to her MyChart. Please call patient with any questions and she requests that this be done as soon as possible as her school needs it. Please advise.

## 2020-09-02 NOTE — Telephone Encounter (Signed)
Route a document for her and send it to her my chart.

## 2020-09-02 NOTE — Telephone Encounter (Signed)
I discussed this with the patient yesterday. She reported that she did not need accommodations but just needed a letter that stated that she had carpal tunnel. A letter has been sent to her mychart. I encouraged the patient to wear her wrist braces at night and take NSAIDS/Tylenol for discomfort.

## 2020-09-15 DIAGNOSIS — G4733 Obstructive sleep apnea (adult) (pediatric): Secondary | ICD-10-CM | POA: Diagnosis not present

## 2020-09-26 ENCOUNTER — Ambulatory Visit: Payer: Medicaid Other | Admitting: Nurse Practitioner

## 2020-10-01 ENCOUNTER — Other Ambulatory Visit (HOSPITAL_COMMUNITY): Payer: Medicaid Other

## 2020-10-03 ENCOUNTER — Encounter (HOSPITAL_COMMUNITY): Admission: RE | Payer: Self-pay | Source: Home / Self Care

## 2020-10-03 ENCOUNTER — Ambulatory Visit (HOSPITAL_COMMUNITY): Admission: RE | Admit: 2020-10-03 | Payer: Medicaid Other | Source: Home / Self Care | Admitting: Oral Surgery

## 2020-10-03 SURGERY — MULTIPLE EXTRACTION WITH ALVEOLOPLASTY
Anesthesia: General

## 2020-10-15 DIAGNOSIS — G4733 Obstructive sleep apnea (adult) (pediatric): Secondary | ICD-10-CM | POA: Diagnosis not present

## 2020-10-15 NOTE — Progress Notes (Signed)
    SUBJECTIVE:   CHIEF COMPLAINT / HPI:   Dizziness: 4 days of dizziness when standing, or when she moves her head. Usually it is when she stands up.  Also occurs when she is closing her eyes.  This is not an 'every day thing'.  She had some nausea without vomiting Monday and Tuesday. No diarrhea.  no hearing loss.  No new headaches.  She feels like 'things are moving'.  On Monday she felt syncopal, but otherwise it has been room spinning sensations.   She has an IUD and doesn't think she's pregnant. She had this issue previously in April but it hasn't been an issue  between April and now. She never went to vestibular PT and doesn't remember being recommended to go to it.    PERTINENT  PMH / PSH: vertigo  OBJECTIVE:   BP 100/62   Pulse 84   Ht 5\' 6"  (1.676 m)   Wt (!) 304 lb 12.8 oz (138.3 kg)   SpO2 99%   BMI 49.20 kg/m   Gen: alert, oriented.  No acute distress HEENT: HINTS exam normal.  PERRLA CV: RRR Pulm: LCTAB Neuro: normal gait.    ASSESSMENT/PLAN:   Dizziness Appears consistent with BPPV. Pt had positive dix hallpike test in April.  Did not go to neuro PT at that time.  Prescribed meclizine 12.5mg  prn and re sent referral to vestibular PT.       May, MD [facility]

## 2020-10-16 ENCOUNTER — Other Ambulatory Visit: Payer: Self-pay

## 2020-10-16 ENCOUNTER — Encounter: Payer: Self-pay | Admitting: Family Medicine

## 2020-10-16 ENCOUNTER — Ambulatory Visit (INDEPENDENT_AMBULATORY_CARE_PROVIDER_SITE_OTHER): Payer: Medicaid Other | Admitting: Family Medicine

## 2020-10-16 VITALS — BP 100/62 | HR 84 | Ht 66.0 in | Wt 304.8 lb

## 2020-10-16 DIAGNOSIS — R42 Dizziness and giddiness: Secondary | ICD-10-CM | POA: Diagnosis not present

## 2020-10-16 MED ORDER — MECLIZINE HCL 12.5 MG PO TABS: 12.5000 mg | ORAL_TABLET | Freq: Three times a day (TID) | ORAL | 0 refills | Status: DC | PRN

## 2020-10-16 NOTE — Patient Instructions (Addendum)
It was nice to meet you today,  I have sent an a referral to vestibular physical therapy who can work with you on exercises to help control your vertigo.  I have also prescribed you a medication you can take as needed called meclizine.  Do not take more than the prescribed dose.  If it does not work, you can can discontinue the medication.  You should schedule appointment with your PCP, Dr. Foster Simpson if this continues to be an issue.  Have a great day,  Frederic Jericho, MD

## 2020-10-18 NOTE — Assessment & Plan Note (Addendum)
Appears consistent with BPPV. Pt had positive dix hallpike test in April.  Did not go to neuro PT at that time.  Prescribed meclizine 12.5mg  prn and re sent referral to vestibular PT.

## 2020-10-23 ENCOUNTER — Other Ambulatory Visit: Payer: Medicaid Other

## 2020-10-23 DIAGNOSIS — Z20822 Contact with and (suspected) exposure to covid-19: Secondary | ICD-10-CM | POA: Diagnosis not present

## 2020-10-24 LAB — NOVEL CORONAVIRUS, NAA: SARS-CoV-2, NAA: NOT DETECTED

## 2020-10-24 LAB — SARS-COV-2, NAA 2 DAY TAT

## 2020-10-31 ENCOUNTER — Other Ambulatory Visit: Payer: Self-pay

## 2020-10-31 ENCOUNTER — Ambulatory Visit: Payer: Medicaid Other | Attending: Family Medicine

## 2020-10-31 DIAGNOSIS — R42 Dizziness and giddiness: Secondary | ICD-10-CM | POA: Diagnosis not present

## 2020-10-31 DIAGNOSIS — R293 Abnormal posture: Secondary | ICD-10-CM | POA: Diagnosis not present

## 2020-10-31 NOTE — Therapy (Signed)
[facility] [address] [place], [place], [address] Phone: [phone]   Fax:  [phone]  Physical Therapy Evaluation  Patient Details  Name: Lauren Obrien MRN: [id number] Date of Birth: July 25, 2001 Referring Provider (PT): Doralee Albino, MD   Encounter Date: 10/31/2020   PT End of Session - 10/31/20 1502    Visit Number 1    Number of Visits 2    Date for PT Re-Evaluation --   Re-Eval at Follow Up Visit   Authorization Type BCBS Medicaid    PT Start Time 1410    PT Stop Time 1455    PT Time Calculation (min) 45 min    Activity Tolerance Patient tolerated treatment well    Behavior During Therapy [facility] for tasks assessed/performed           Past Medical History:  Diagnosis Date  . Acne vulgaris   . GERD (gastroesophageal reflux disease)   . Helicobacter pylori gastritis   . Hidradenitis suppurativa   . Neuropathy    BUE  . Pharyngitis 11/13/2019    History reviewed. No pertinent surgical history.  There were no vitals filed for this visit.    Subjective Assessment - 10/31/20 1413    Subjective Patient reports that she had some dizziness approx two years ago. Most recent episode of dizziness occured approx 2 weeks ago. Patient report after she was eating food, she stoodand got lightheadedness. Patient has also had some episodes of dizziness when laying down. Did have some nauseous sensation associated. Mild dizziness currently, no migraine today. Patient reports she does have a history of migraines. She reports she does not have dizziness when migraines occur.    Pertinent History GERD, Neuropathy (BUE)    Patient Stated Goals Resolve symptoms    Currently in Pain? No/denies              [facility] PT Assessment - 10/31/20 0001      Assessment   Medical Diagnosis Vertigo/Dizziness    Referring Provider (PT) Doralee Albino, MD    Onset Date/Surgical Date 10/16/20   Referral Date     Precautions    Precautions None      Balance Screen   Has the patient fallen in the past 6 months No    Has the patient had a decrease in activity level because of a fear of falling?  No    Is the patient reluctant to leave their home because of a fear of falling?  No      Prior Function   Level of Independence Independent    Energy manager Requirements Currently student at [employer] Take care of Son      Cognition   Overall Cognitive Status Within Functional Limits for tasks assessed      Posture/Postural Control   Posture/Postural Control Postural limitations    Postural Limitations Rounded Shoulders;Forward head      Ambulation/Gait   Ambulation/Gait Yes    Ambulation/Gait Assistance 7: Independent    Ambulation Distance (Feet) 50 Feet    Assistive device None    Gait Pattern Within Functional Limits    Ambulation Surface Level;Indoor                  Vestibular Assessment - 10/31/20 0001      Symptom Behavior   Subjective history of current problem Patient reports has been having dizziness/vertigo symptoms. Patient report after she was eating food, she stood &  got lightheaded. Patient has also had some episodes of dizziness when laying down. Did have some nauseous sensation associated. Denies tinnitus, double vision. Reports some blurring of vision at times, but present prior.    Type of Dizziness  Spinning;Lightheadedness    Frequency of Dizziness with positional changes, supine <> sit, and sit <> stand    Duration of Dizziness few hours to resolve    Symptom Nature Motion provoked;Intermittent    Aggravating Factors Mornings;Turning head quickly;Supine to sit;Sit to stand    Relieving Factors Slow movements    Progression of Symptoms Better      Oculomotor Exam   Oculomotor Alignment Normal    Ocular ROM WNL    Spontaneous Absent    Gaze-induced  Absent    Smooth Pursuits Intact    Saccades Intact      Oculomotor Exam-Fixation Suppressed    Left  Head Impulse Normal    Right Head Impulse Normal      Vestibulo-Ocular Reflex   VOR 1 Head Only (x 1 viewing) Increased symptoms with VOR x 1 horizontal/vertical, 3/10 dizziness after completion      Visual Acuity   Static 7    Dynamic 7   mild blurred vision     Positional Testing   Dix-Hallpike Dix-Hallpike Right;Dix-Hallpike Left    Horizontal Canal Testing Horizontal Canal Right;Horizontal Canal Left      Dix-Hallpike Right   Dix-Hallpike Right Duration None    Dix-Hallpike Right Symptoms No nystagmus      Dix-Hallpike Left   Dix-Hallpike Left Duration None    Dix-Hallpike Left Symptoms No nystagmus      Horizontal Canal Right   Horizontal Canal Right Duration None    Horizontal Canal Right Symptoms Normal      Horizontal Canal Left   Horizontal Canal Left Duration None    Horizontal Canal Left Symptoms Normal      Positional Sensitivities   Sit to Supine No dizziness    Supine to Sitting Lightheadedness    Right Hallpike No dizziness    Up from Right Hallpike Lightheadedness    Up from Left Hallpike Lightheadedness              Objective measurements completed on examination: See above findings.       PT Education - 10/31/20 1502    Education Details Educated on Vestibular Rehab process; Evaluation Findings/POC    Person(s) Educated Patient    Methods Explanation    Comprehension Verbalized understanding            PT Short Term Goals - 10/31/20 1508      PT SHORT TERM GOAL #1   Title = LTGs             PT Long Term Goals - 10/31/20 1509      PT LONG TERM GOAL #1   Title Patient will be independent with vestibular HEP    Baseline No HEP Established    Period --   at follow up visit   Status New    Target Date 11/25/20      PT LONG TERM GOAL #2   Title Patient will report no dizziness (0/10) with completion of MSQ to demonstrate improved tolerance for activities    Baseline 1/10    Period --   follow up visit   Status New     Target Date 11/25/20                  Plan -  10/31/20 1511    Clinical Impression Statement Patient is a 19 y.o. female that was referred to Neuro OPPT services for Vertigo/Dizziness. Patient's PMH is significant for the following: GERD, Neuropathy, and reports history of migraines. Upon evaluation, patient having increased symptoms supine <> sit and VOR x 1. Patient negative for positoinal testing upon initial evaluation. Educated patient on vestibular rehab program and habituation for reduced motion senstivities. Differential diagnosis of motion sensitivity vs. vestibular migraine on evaluation. Patient will benefit from skilled PT services to address impairments and reduced vertiginous symptoms with functional activities.    Personal Factors and Comorbidities Comorbidity 2    Comorbidities GERD, Neuropathy (BUE)    Examination-Activity Limitations Bed Mobility    Examination-Participation Restrictions School;Interpersonal Relationship    Stability/Clinical Decision Making Stable/Uncomplicated    Clinical Decision Making Low    Rehab Potential Fair    PT Frequency Other (comment)   follow up visit   PT Duration --   follow up visit   PT Treatment/Interventions Canalith Repostioning;Functional mobility training;Therapeutic activities;Therapeutic exercise;Neuromuscular re-education;Patient/family education;Balance training;Gait training;Stair training;Vestibular    PT Next Visit Plan How has patient been feeling? Assess MSQ. Assess Orthostatics    Consulted and Agree with Plan of Care Patient           Patient will benefit from skilled therapeutic intervention in order to improve the following deficits and impairments:  Dizziness,Postural dysfunction  Visit Diagnosis: Dizziness and giddiness  Abnormal posture     Problem List Patient Active Problem List   Diagnosis Date Noted  . Right wrist tendinitis 06/06/2020  . Vaginal itching 06/03/2020  . Viral URI 02/01/2020  .  Class 3 severe obesity due to excess calories without serious comorbidity with body mass index (BMI) of 45.0 to 49.9 in adult (HCC) 01/22/2020  . Snoring 01/22/2020  . Chronic fatigue 01/22/2020  . Excessive daytime sleepiness 01/22/2020  . OSA (obstructive sleep apnea) 01/22/2020  . Cellulitis of breast 01/16/2020  . Oropharyngeal dysphagia 01/11/2020  . Sore throat 11/13/2019  . Folliculitis 08/29/2019  . Prediabetes 08/29/2019  . Dizziness 08/23/2019  . Memory difficulties 07/01/2019  . Chronic tension type headache 05/29/2019  . Acanthosis nigricans 05/29/2019  . 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 Hidradenitis suppurativa 03/04/2019  . Carpal tunnel syndrome of left wrist 01/04/2019  . Blurry vision, bilateral 12/16/2018  . Learning difficulty 12/16/2018  . Left wrist tendinitis 12/01/2018  . Sleep deprivation 09/28/2018  . Recurrent boils 09/21/2018  . Irregular menstrual bleeding 04/03/2018  . Acne vulgaris 04/03/2018  . Acid reflux 01/21/2016  . Allergic rhinitis 09/23/2015  . Constipation 07/24/2015    Tempie Donning, PT, DPT 10/31/2020, 3:25 PM  [facility] [address] [place], [place], [address] Phone: [phone]   Fax:  [phone]  Name: Lauren Obrien MRN: [id number] Date of Birth: 07-Mar-2001

## 2020-11-03 ENCOUNTER — Other Ambulatory Visit: Payer: Medicaid Other

## 2020-11-15 DIAGNOSIS — G4733 Obstructive sleep apnea (adult) (pediatric): Secondary | ICD-10-CM | POA: Diagnosis not present

## 2020-11-20 ENCOUNTER — Telehealth: Payer: Self-pay | Admitting: Family Medicine

## 2020-11-20 DIAGNOSIS — G5602 Carpal tunnel syndrome, left upper limb: Secondary | ICD-10-CM

## 2020-11-20 NOTE — Telephone Encounter (Signed)
Patient requesting a referral be sent to [facility] for her carpel tunnel. Please advise.

## 2020-11-21 NOTE — Telephone Encounter (Signed)
Called patient to discuss her symptoms.  She reports continued symptoms even after wearing the brace.  She would like a referral to orthopedics regardless of if she needs to see me first or not.  We discussed her carpal tunnel at a visit in July of last year.  I have placed a referral for orthopedics but she may need a visit with [place] before.

## 2020-11-25 ENCOUNTER — Ambulatory Visit: Payer: Medicaid Other | Attending: Family Medicine

## 2020-11-25 ENCOUNTER — Other Ambulatory Visit: Payer: Self-pay

## 2020-11-25 DIAGNOSIS — R293 Abnormal posture: Secondary | ICD-10-CM | POA: Insufficient documentation

## 2020-11-25 DIAGNOSIS — R42 Dizziness and giddiness: Secondary | ICD-10-CM | POA: Insufficient documentation

## 2020-11-25 NOTE — Therapy (Signed)
[facility] [address] [place], [place], [address] Phone: [phone]   Fax:  [phone]  Physical Therapy Treatment/Discharge Summary  Patient Details  Name: Lauren Obrien MRN: [id number] Date of Birth: November 22, 2000 Referring Provider (PT): Madison Hickman, MD  PHYSICAL THERAPY DISCHARGE SUMMARY  Visits from Start of Care: 2  Current functional level related to goals / functional outcomes: See Clinical Impression Statement.   Remaining deficits: Lightheadedness, Fatigue   Education / Equipment: Educated to follow up with PCP  Plan: Patient agrees to discharge.  Patient goals were not met. Patient is being discharged due to                                                     ??Patient is being discharged at this time, until is further evaluated by MD regarding medical concerns including blood sugar and orthostatic hypotension. Patient will plan to reach out with new order after completion if would like to return to PT services.  ???              Encounter Date: 11/25/2020   PT End of Session - 11/25/20 1535    Visit Number 2    Number of Visits 2    Date for PT Re-Evaluation --   Re-Eval at Follow Up Visit   Authorization Type BCBS Medicaid    PT Start Time 9935    PT Stop Time 1616    PT Time Calculation (min) 43 min    Activity Tolerance Patient tolerated treatment well    Behavior During Therapy WFL for tasks assessed/performed           Past Medical History:  Diagnosis Date  . Acne vulgaris   . GERD (gastroesophageal reflux disease)   . Helicobacter pylori gastritis   . Hidradenitis suppurativa   . Neuropathy    BUE  . Pharyngitis 11/13/2019    No past surgical history on file.  There were no vitals filed for this visit.   Subjective Assessment - 11/25/20 1537    Subjective Patient reports the dizziness has gotten better, but still reports lightheadedness sensation. No recent  Migraines.    Pertinent History GERD, Neuropathy (BUE)    Patient Stated Goals Resolve symptoms    Currently in Pain? No/denies                   Vestibular Assessment - 11/25/20 0001      Positional Sensitivities   Sit to Supine Lightheadedness    Supine to Sitting Lightheadedness    Positional Sensitivities Comments sit <> stand lightheadedness      Orthostatics   BP supine (x 5 minutes) 129/98    HR supine (x 5 minutes) 83    BP sitting 118/80    HR sitting 81    BP standing (after 1 minute) 101/65    HR standing (after 1 minute) 92    BP standing (after 3 minutes) 105/78    HR standing (after 3 minutes) 94    Orthostatics Comment increased symptoms/lightheadedness from             [facility] Adult PT Treatment/Exercise - 11/25/20 0001      Self-Care   Self-Care Other Self-Care Comments    Other Self-Care Comments  PT educating on OH (handout provided), reviewed handout and  educated on orthostatic hypotension, addressing any questions patient may have. PT educating on adequate nutrition, potential need for patient to be assessed by MD to further assess blood sugar concerns since pre - diabetic. Patient had pretty significant drop in BP with positional changes demonstrating OH. Patient agreeing to reach out to MD to schedule appointment to be further evaluated.                  PT Education - 11/25/20 1641    Education Details Educated on Orthostatic Hypotension; Return to MD for further assessment of concerns    Person(s) Educated Patient    Methods Explanation    Comprehension Verbalized understanding            PT Short Term Goals - 10/31/20 1508      PT SHORT TERM GOAL #1   Title = LTGs             PT Long Term Goals - 11/25/20 1652      PT LONG TERM GOAL #1   Title Patient will be independent with vestibular HEP    Baseline deferred no HEP established    Period --   at follow up visit   Status Deferred      PT LONG TERM GOAL #2    Title Patient will report no dizziness (0/10) with completion of MSQ to demonstrate improved tolerance for activities    Baseline still reports 1/10 (lightheadedness)    Period --   follow up visit   Status Not Met                 Plan - 11/25/20 1635    Clinical Impression Statement Today's skilled PT session focused on further assessment. With [facility] assesment, patient had significant drop in BP wiht reports of lightheadedness. Due to patient symptoms and assesment findings, and due to family history and being pre diabetic. PT educating on potenital to be re-evaluated by MD to assess blood sugar and health from medical standpoint. Patient will be discharged from PT services, and if determines that will need PT services after evaluation from MD will reach back out to facility.    Personal Factors and Comorbidities Comorbidity 2    Comorbidities GERD, Neuropathy (BUE)    Examination-Activity Limitations Bed Mobility    Examination-Participation Restrictions School;Interpersonal Relationship    Stability/Clinical Decision Making Stable/Uncomplicated    Rehab Potential Fair    PT Frequency Other (comment)   follow up visit   PT Duration --   follow up visit   PT Treatment/Interventions Canalith Repostioning;Functional mobility training;Therapeutic activities;Therapeutic exercise;Neuromuscular re-education;Patient/family education;Balance training;Gait training;Stair training;Vestibular    PT Next Visit Plan --    Consulted and Agree with Plan of Care Patient           Patient will benefit from skilled therapeutic intervention in order to improve the following deficits and impairments:  Dizziness,Postural dysfunction  Visit Diagnosis: Dizziness and giddiness  Abnormal posture     Problem List Patient Active Problem List   Diagnosis Date Noted  . Right wrist tendinitis 06/06/2020  . Vaginal itching 06/03/2020  . Viral URI 02/01/2020  . Class 3 severe obesity due to excess  calories without serious comorbidity with body mass index (BMI) of 45.0 to 49.9 in adult ([place]) 01/22/2020  . Snoring 01/22/2020  . Chronic fatigue 01/22/2020  . Excessive daytime sleepiness 01/22/2020  . OSA (obstructive sleep apnea) 01/22/2020  . Cellulitis of breast 01/16/2020  . Oropharyngeal dysphagia 01/11/2020  .  Sore throat 11/13/2019  . Folliculitis 73/40/3709  . Prediabetes 08/29/2019  . Dizziness 08/23/2019  . Memory difficulties 07/01/2019  . Chronic tension type headache 05/29/2019  . Acanthosis nigricans 05/29/2019  . 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 Hidradenitis suppurativa 03/04/2019  . Carpal tunnel syndrome of left wrist 01/04/2019  . Blurry vision, bilateral 12/16/2018  . Learning difficulty 12/16/2018  . Left wrist tendinitis 12/01/2018  . Sleep deprivation 09/28/2018  . Recurrent boils 09/21/2018  . Irregular menstrual bleeding 04/03/2018  . Acne vulgaris 04/03/2018  . Acid reflux 01/21/2016  . Allergic rhinitis 09/23/2015  . Constipation 07/24/2015    Jones Bales, PT, DPT  11/25/2020, 4:57 PM  [facility] [address] [place], [place], [address] Phone: [phone]   Fax:  [phone]  Name: Brycelynn Stampley MRN: [id number] Date of Birth: [birth year]

## 2020-12-03 ENCOUNTER — Telehealth (INDEPENDENT_AMBULATORY_CARE_PROVIDER_SITE_OTHER): Payer: Medicaid Other | Admitting: Family Medicine

## 2020-12-03 ENCOUNTER — Encounter: Payer: Self-pay | Admitting: Family Medicine

## 2020-12-03 ENCOUNTER — Ambulatory Visit: Payer: Medicaid Other | Admitting: Family Medicine

## 2020-12-03 DIAGNOSIS — J069 Acute upper respiratory infection, unspecified: Secondary | ICD-10-CM | POA: Diagnosis not present

## 2020-12-03 MED ORDER — CETIRIZINE HCL 10 MG PO TABS: 10.0000 mg | ORAL_TABLET | Freq: Every day | ORAL | 11 refills | Status: DC | PRN

## 2020-12-03 MED ORDER — FLUTICASONE PROPIONATE 50 MCG/ACT NA SUSP: 2.0000 | NASAL | 2 refills | Status: DC | PRN

## 2020-12-03 NOTE — Progress Notes (Signed)
 [facility] Telemedicine Visit  Patient consented to have virtual visit and was identified by name and date of birth. Method of visit: Video  Encounter participants: Patient: Willma Obando IOEVOJ - located at home Provider: Joana Reamer - located at home Others (if applicable): none  Chief Complaint: cough, congestion  HPI: Sore throat, itchy throat, congestion, headaches, body aches (which improved), tactile fever but no true temperature and this has resolved, mild cough. These symptoms started on Friday (1/14). Denies any difficulty breathing. She plans to be COVID tested tomorrow. Not covid vaccinated. Eating and drinking normally. Some decreased appetite. Decreased sleep.    ROS: per HPI  Pertinent PMHx: Allergic rhinitis, OSA  Exam:  There were no vitals taken for this visit.  Gen: Overall well appearing in no acute distress Respiratory: speaking in full sentences, breathing comfortably on room air  Assessment/Plan:  Viral URI Symptoms appear most consistent with viral URI with cough. She is very well appearing on exam with no red flag symptoms. Patient is eating, drinking, voiding, and stooling normally which is reassuring. Highly recommended COVID testing - plan to obtain tomorrow. ED precautions discussed. Patient voiced understanding and agreement with plan. - encouraged COVID testing and isolation per CDC guiveidelines if posit - symptomatic treatment discussed and list provided to patient via MyChart - Flonase and Claritin rx provided - school note provided  - fluids encouraged - Follow up if no improvement, sooner if worsening    Time spent during visit with patient: 15 minutes   Orpah Cobb, DO [facility], PGY3 12/03/2020 2:40 PM

## 2020-12-03 NOTE — Assessment & Plan Note (Addendum)
Symptoms appear most consistent with viral URI with cough. She is very well appearing on exam with no red flag symptoms. Patient is eating, drinking, voiding, and stooling normally which is reassuring. Highly recommended COVID testing - plan to obtain tomorrow. ED precautions discussed. Patient voiced understanding and agreement with plan. - encouraged COVID testing and isolation per CDC guiveidelines if posit - symptomatic treatment discussed and list provided to patient via MyChart - Flonase and Claritin rx provided - school note provided  - fluids encouraged - Follow up if no improvement, sooner if worsening

## 2020-12-04 ENCOUNTER — Other Ambulatory Visit: Payer: Medicaid Other

## 2020-12-04 DIAGNOSIS — Z20822 Contact with and (suspected) exposure to covid-19: Secondary | ICD-10-CM | POA: Diagnosis not present

## 2020-12-05 LAB — SARS-COV-2, NAA 2 DAY TAT

## 2020-12-05 LAB — NOVEL CORONAVIRUS, NAA: SARS-CoV-2, NAA: DETECTED — AB

## 2020-12-08 DIAGNOSIS — Z20822 Contact with and (suspected) exposure to covid-19: Secondary | ICD-10-CM | POA: Diagnosis not present

## 2020-12-11 ENCOUNTER — Ambulatory Visit (INDEPENDENT_AMBULATORY_CARE_PROVIDER_SITE_OTHER): Payer: Medicaid Other | Admitting: Family Medicine

## 2020-12-11 ENCOUNTER — Ambulatory Visit: Payer: Medicaid Other | Admitting: Family Medicine

## 2020-12-11 ENCOUNTER — Other Ambulatory Visit: Payer: Self-pay

## 2020-12-11 VITALS — HR 86 | Temp 99.0°F

## 2020-12-11 DIAGNOSIS — U071 COVID-19: Secondary | ICD-10-CM

## 2020-12-11 HISTORY — DX: COVID-19: U07.1

## 2020-12-11 NOTE — Progress Notes (Signed)
    SUBJECTIVE:   CHIEF COMPLAINT / HPI: Covid positive/return to school  Neysa is a 20 year old female presenting for evaluation after recently testing positive for COVID-19.  She is requesting a letter to return to school today.  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  She initially started having symptoms on 1/14 with cough, sore/itchy throat, lightheaded, body aches, and congestion and then subsequently tested positive on 1/20.  She reports that the majority of her symptoms have resolved with the exception of occasional runny nose and cough.  No fever.  She additionally would like to soon get her COVID-19 vaccinations, not previously vaccinated.  She would like to travel to [place] and this is required to have.  PERTINENT  PMH / PSH: OSA, acne, elevated BMI  OBJECTIVE:   Pulse 86   Temp 99 F (37.2 C) (Oral)   SpO2 99%   General: Alert, NAD HEENT: NCAT, MMM Cardiac: RRR no m/g/r Lungs: Clear bilaterally, no increased WOB on room air without any wheezing or rhonchi Msk: Moves all extremities spontaneously  Ext: Warm, dry, 2+ distal pulses  ASSESSMENT/PLAN:   COVID-19 Symptom onset 1/14, tested + 1/20.  No further fever and majority of symptoms have resolved.  Given that she is completed over a 10-day quarantine and improved, provided letter to return to school.  Recommended proceeding with vaccination after she has completely recovered from symptoms, likely in approximately 1 month.  Discussed postponing longer as she currently has significant natural immunity, however would like to get vaccinated quickly so that she can return to [place].    Follow-up if any concerns, especially difficulty breathing.  Allayne Stack, DO [place]

## 2020-12-11 NOTE — Assessment & Plan Note (Signed)
Symptom onset 1/14, tested + 1/20.  No further fever and majority of symptoms have resolved.  Given that she is completed over a 10-day quarantine and improved, provided letter to return to school.  Recommended proceeding with vaccination after she has completely recovered from symptoms, likely in approximately 1 month.  Discussed postponing longer as she currently has significant natural immunity, however would like to get vaccinated quickly so that she can return to [place].

## 2020-12-15 DIAGNOSIS — M542 Cervicalgia: Secondary | ICD-10-CM | POA: Diagnosis not present

## 2020-12-16 DIAGNOSIS — G4733 Obstructive sleep apnea (adult) (pediatric): Secondary | ICD-10-CM | POA: Diagnosis not present

## 2020-12-19 ENCOUNTER — Telehealth: Payer: Self-pay | Admitting: Family Medicine

## 2020-12-19 NOTE — Telephone Encounter (Signed)
Patient is calling stating she is needing a referral to a different Dermatologist. Please advise. Thanks!

## 2020-12-26 ENCOUNTER — Other Ambulatory Visit: Payer: Self-pay

## 2020-12-26 ENCOUNTER — Encounter: Payer: Self-pay | Admitting: Family Medicine

## 2020-12-26 ENCOUNTER — Encounter: Payer: Self-pay | Admitting: Neurology

## 2020-12-26 ENCOUNTER — Ambulatory Visit (INDEPENDENT_AMBULATORY_CARE_PROVIDER_SITE_OTHER): Payer: Medicaid Other | Admitting: Family Medicine

## 2020-12-26 VITALS — BP 104/66 | HR 110 | Ht 66.0 in | Wt 307.4 lb

## 2020-12-26 DIAGNOSIS — Z30431 Encounter for routine checking of intrauterine contraceptive device: Secondary | ICD-10-CM

## 2020-12-26 DIAGNOSIS — R42 Dizziness and giddiness: Secondary | ICD-10-CM

## 2020-12-26 DIAGNOSIS — Z113 Encounter for screening for infections with a predominantly sexual mode of transmission: Secondary | ICD-10-CM

## 2020-12-26 DIAGNOSIS — Z309 Encounter for contraceptive management, unspecified: Secondary | ICD-10-CM | POA: Insufficient documentation

## 2020-12-26 DIAGNOSIS — J069 Acute upper respiratory infection, unspecified: Secondary | ICD-10-CM | POA: Diagnosis not present

## 2020-12-26 DIAGNOSIS — L700 Acne vulgaris: Secondary | ICD-10-CM

## 2020-12-26 DIAGNOSIS — R7303 Prediabetes: Secondary | ICD-10-CM

## 2020-12-26 HISTORY — DX: Encounter for screening for infections with a predominantly sexual mode of transmission: Z11.3

## 2020-12-26 LAB — POCT GLYCOSYLATED HEMOGLOBIN (HGB A1C): Hemoglobin A1C: 5.7 % — AB (ref 4.0–5.6)

## 2020-12-26 MED ORDER — MECLIZINE HCL 12.5 MG PO TABS: 12.5000 mg | ORAL_TABLET | Freq: Three times a day (TID) | ORAL | 0 refills | Status: DC | PRN

## 2020-12-26 MED ORDER — CETIRIZINE HCL 10 MG PO TABS: 10.0000 mg | ORAL_TABLET | Freq: Every day | ORAL | 11 refills | Status: DC | PRN

## 2020-12-26 NOTE — Assessment & Plan Note (Signed)
A1c today 5.7%, unchanged from previous. Do not believe that blood sugar is contributing to her symptoms of dizziness. - Counseled on diet and exercise

## 2020-12-26 NOTE — Assessment & Plan Note (Signed)
IUD in place since March of 2017. Patient would like to replace with a new one, but specifically requests female doctor. - Patient to make appointment for IUD replacement in coming weeks.

## 2020-12-26 NOTE — Assessment & Plan Note (Signed)
-   Ambulatory referral to dermatology with instruction to avoid [facility]

## 2020-12-26 NOTE — Assessment & Plan Note (Addendum)
Symptoms are consistent with postural hypotension, corroborated by vitals measured at vestibular rehab. - Encourage increased water intake throughout the day - Okay to use Meclizine PRN for true vertigo symptoms  - May warrant return to vestibular rehab if vertigo is recurrent

## 2020-12-26 NOTE — Progress Notes (Addendum)
    SUBJECTIVE:   CHIEF COMPLAINT / HPI:   "Dizziness"  Vertigo [name] was seen in December for Vertigo and prescribed PRN meclizine and referred to vestibular rehab. She only used the meclizine once, but did find that it helped. 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 She reports that she is drinking less than 48 oz of water per day and acknowledges that she is often thirsty when she is dizzy. She has episodes of dizziness that happen about once per week, usually when standing up.  Her vertigo has been otherwise well-controlled.   Prediabetes She would like her A1c checked because she is concerned that glucose levels may be contributing to her dizziness. She reports feeling "dizzy" when she eats "a lot of sugar, like Oreos or  Ice cream."   Acne She was previously referred to [facility] for a dermatology appointment but had "a bad experience with the doctor there" and would like referral elsewhere.   Contraception management Her IUD is approaching five years old in March. She would like a new one placed but would prefer this be done by a female doctor.    PERTINENT  PMH / PSH: Acne vulgaris; prediabetes; vertigo   OBJECTIVE:   BP 104/66   Pulse (!) 110   Ht 5\' 6"  (1.676 m)   Wt (!) 307 lb 6.4 oz (139.4 kg)   SpO2 98%   BMI 49.62 kg/m   Physical Exam Constitutional:      General: She is not in acute distress.    Appearance: She is obese.  Skin:    Comments: Acne vulgaris noted on face  Neurological:     General: No focal deficit present.     Mental Status: She is alert.     Gait: Gait normal.  Psychiatric:        Mood and Affect: Mood normal.        Behavior: Behavior normal.      ASSESSMENT/PLAN:   Acne vulgaris - Ambulatory referral to dermatology with instruction to avoid [place]   Prediabetes A1c today 5.7%, unchanged from previous. Do not believe that blood sugar is contributing to her symptoms of dizziness. -  Counseled on diet and exercise  Dizziness Symptoms are consistent with postural hypotension, corroborated by vitals measured at vestibular rehab. - Encourage increased water intake throughout the day - Okay to use Meclizine PRN for true vertigo symptoms  - May warrant return to vestibular rehab if vertigo is recurrent   Contraceptive management IUD in place since March of 2017. Patient would like to replace with a new one, but specifically requests female doctor. - Patient to make appointment for IUD replacement in coming weeks.      10-25-1970, Medical Student [facility]   Resident Attestation  I saw and evaluated the patient, performing the key elements of the service.I  personally performed or re-performed the history, physical exam, and medical decision making activities of this service and have verified that the service and findings are accurately documented in the student's note. I developed the management plan that is described in the medical student's note, and I agree with the content, with my edits above.    Lauren Obrien, PGY2

## 2020-12-26 NOTE — Patient Instructions (Addendum)
Lauren Obrien,  It is always a pleasure seeing you. Here's a recap of what we talked about today:  You seem to have postural hypotension. This is almost certainly related to you not drinking enough water throughout the day. Please try to increase your intake to 4-5 bottles of water daily.  We checked your hemoglobin A1c today, which gives [place] an idea of your average blood sugar over the past three months. Yours was 5.7%, which is the same as 2 years ago. Diet and exercise are the best ways to keep this within normal limits.   You also continue to have vertigo. You can take the meclizine pills when you have symptoms, but we don't want you taking this all the time. Once your postural hypotension is under control, we can consider sending you back to vestibular rehab.  We will make a referral to dermatology for you to go somewhere other than [place].  Your IUD will be 21 years old in March. Please make an appointment to have that replaced as you are able.   [name] and Dr. Nobie Putnam

## 2021-01-08 DIAGNOSIS — F900 Attention-deficit hyperactivity disorder, predominantly inattentive type: Secondary | ICD-10-CM | POA: Diagnosis not present

## 2021-01-16 ENCOUNTER — Telehealth: Payer: Self-pay | Admitting: Family Medicine

## 2021-01-16 NOTE — Telephone Encounter (Signed)
Will have team call patient and see if she is willing to drive to [place].  [facility] is the next closest office that will take medicaid.  Unfortunately the options are limited due to that reason.  Burnard Hawthorne

## 2021-01-16 NOTE — Telephone Encounter (Signed)
Patient is calling concerning her derm referral. [facility] but they did not have any female providers available. She is more comfortable seeing females only for this visit.   She would like to know if a referral can be sent to another office.

## 2021-01-19 NOTE — Telephone Encounter (Signed)
LVM to call office back to give her the below information and to see if she is willing to go there. Aamani Moose Zimmerman Rumple, CMA

## 2021-01-20 ENCOUNTER — Telehealth: Payer: Self-pay

## 2021-01-20 DIAGNOSIS — F900 Attention-deficit hyperactivity disorder, predominantly inattentive type: Secondary | ICD-10-CM | POA: Diagnosis not present

## 2021-01-20 NOTE — Telephone Encounter (Signed)
Patient calls nurse line stating she was contacted by the [facility] center in regards to referral. Patient states she only wants to be seen by a female provider, but she was told they only have female providers. Patient is requesting to be referred elsewhere with plenty of female providers. Will forward to referral coordinator.

## 2021-01-20 NOTE — Telephone Encounter (Signed)
Referral faxed to [facility].  They take only 2 types of medicaid and patient does have the healthy blue option they take.  They will call her for an appointment.  Specified female provider preferred.  Mohamed Portlock,CMA

## 2021-01-22 ENCOUNTER — Telehealth: Payer: Self-pay | Admitting: Family Medicine

## 2021-01-22 ENCOUNTER — Ambulatory Visit: Payer: Medicaid Other | Admitting: Family Medicine

## 2021-01-22 NOTE — Telephone Encounter (Signed)
Called patient and left her a message for [facility] in [place].  They have a couple of female providers.  Rylynn Kobs,CMA

## 2021-01-22 NOTE — Telephone Encounter (Signed)
Called patient regarding IUD replacement.  They recently extended the efficacy of the Mirena IUD meaning she has not due for an IUD change at this time.  She is understanding and reports that she did not wish to have an appointment today if it is not time to change her IUD.  She also was expecting a female provider to do the IUD change.

## 2021-01-22 NOTE — Telephone Encounter (Signed)
Contacted pt and she is NOT willing to go to [place] at this time. She is going to reach back out to the place and ask them if they know someone. Lauren Obrien, CMA

## 2021-01-29 ENCOUNTER — Encounter: Payer: Self-pay | Admitting: Family Medicine

## 2021-01-29 DIAGNOSIS — F900 Attention-deficit hyperactivity disorder, predominantly inattentive type: Secondary | ICD-10-CM | POA: Diagnosis not present

## 2021-02-02 ENCOUNTER — Telehealth: Payer: Self-pay | Admitting: *Deleted

## 2021-02-02 DIAGNOSIS — L218 Other seborrheic dermatitis: Secondary | ICD-10-CM | POA: Diagnosis not present

## 2021-02-02 DIAGNOSIS — L700 Acne vulgaris: Secondary | ICD-10-CM | POA: Diagnosis not present

## 2021-02-02 DIAGNOSIS — L603 Nail dystrophy: Secondary | ICD-10-CM | POA: Diagnosis not present

## 2021-02-02 MED ORDER — IBUPROFEN 200 MG PO TABS: 400.0000 mg | ORAL_TABLET | Freq: Four times a day (QID) | ORAL | 0 refills | Status: DC | PRN

## 2021-02-02 MED ORDER — IBUPROFEN 400 MG PO TABS: 400.0000 mg | ORAL_TABLET | Freq: Three times a day (TID) | ORAL | 3 refills | Status: DC | PRN

## 2021-02-02 NOTE — Telephone Encounter (Signed)
Refill request form [facility] requesting refill on ibuprofen 400 mg, but did not see on current med list.Teretha Chalupa [employer], CMA

## 2021-02-02 NOTE — Telephone Encounter (Signed)
Prescription for the 400 mg tablets sent to patient pharmacy.

## 2021-02-02 NOTE — Telephone Encounter (Signed)
Prescription sent to patient's pharmacy.

## 2021-02-02 NOTE — Telephone Encounter (Signed)
Patient calls nurse line requesting PCP to send in 400mg  ibuprofen tabs. I advised patient PCP sent 200mg  to her pharmacy earlier today. However, medicaid only covers 400mg  and above. Please resend to her pharmacy so she does not have to pay out pocket for medication.

## 2021-02-02 NOTE — Addendum Note (Signed)
Addended by: Celedonio Savage on: 02/02/2021 03:02 PM   Modules accepted: Orders

## 2021-02-09 ENCOUNTER — Ambulatory Visit (INDEPENDENT_AMBULATORY_CARE_PROVIDER_SITE_OTHER): Payer: Medicaid Other | Admitting: Neurology

## 2021-02-09 ENCOUNTER — Encounter: Payer: Self-pay | Admitting: Neurology

## 2021-02-09 ENCOUNTER — Other Ambulatory Visit: Payer: Self-pay

## 2021-02-09 VITALS — BP 137/84 | HR 83 | Ht 66.0 in | Wt 306.0 lb

## 2021-02-09 DIAGNOSIS — R200 Anesthesia of skin: Secondary | ICD-10-CM | POA: Diagnosis not present

## 2021-02-09 NOTE — Progress Notes (Signed)
[facility] Neurology [facility] Clinic Note - Initial Visit   Date: 02/09/21  Lauren Obrien MRN: [id number] DOB: Mar 04, 2001   Dear Dr. Marjory Lies:  Thank you for your kind referral of Lauren Obrien [facility] for consultation of left arm numbness. Although her history is well known to you, please allow [place] to reiterate it for the purpose of our medical record. The patient was accompanied to the clinic by self.    History of Present Illness: Lauren Obrien is a 20 y.o. right-handed female with GERD presenting for evaluation of left arm numbness.  Starting in 2020, she began having left wrist pain.  She has treated it with NSAIDs, which helped some.  She then began having numbness/tingling in the left hand and arm.  It is worse when she types or washes dishes. Nothing improves her symptoms.  Over the years, symptoms have become worse and she now starts to have similar problems in the right arm.  She tried wearing a wrist brace, but it made her arm feel even weaker.  She also complains of weakness in the left arm.  She works as a Scientist, physiological. She lives with parents, brother, and 15-year old son.   She has been evaluated for symptoms in 2020 by Dr. Marjory Lies at [facility] with NCS/EMG of the left arm, MRI brain, and MRI cervical spine which has been normal. She has not done PT.   Out-side paper records, electronic medical record, and images have been reviewed where available and summarized as:  MRI brain wwo contrast 09/01/2019: This MRI of the brain with and without contrast shows the following: 1.   There are multiple T2/flair hyperintense foci in the subcortical white matter.  None of these appear to be acute and there do not appear to be any new foci compared to the 2017 MRI.  This is a nonspecific finding and could represent sequela of migraine, chronic microvascular ischemic change or prior infection or trauma.  There are no foci in the periventricular white matter or the infratentorial  white matter and demyelination is less likely. 2.   There was a normal enhancement pattern and no acute findings.  MRI cervical spine wwo contrast 09/01/2019:  Normal  NCS/EMG of the left arm 10/04/2019:  Normal  Lab Results  Component Value Date   HGBA1C 5.7 (A) 12/26/2020   Lab Results  Component Value Date   VITAMINB12 472 06/26/2019   Lab Results  Component Value Date   TSH 1.710 06/26/2019   No results found for: ESRSEDRATE, POCTSEDRATE  Past Medical History:  Diagnosis Date  . Acne vulgaris   . GERD (gastroesophageal reflux disease)   . Helicobacter pylori gastritis   . Hidradenitis suppurativa   . Neuropathy    BUE  . Pharyngitis 11/13/2019    History reviewed. No pertinent surgical history.   Medications:  Outpatient Encounter Medications as of 02/09/2021  Medication Sig  . cetirizine (ZYRTEC) 10 MG tablet Take 1 tablet (10 mg total) by mouth daily as needed for allergies.  . fluticasone (FLONASE) 50 MCG/ACT nasal spray Place 2 sprays into both nostrils as needed.  Marland Kitchen ibuprofen (ADVIL) 400 MG tablet Take 1 tablet (400 mg total) by mouth every 8 (eight) hours as needed.  Marland Kitchen levonorgestrel (MIRENA) 20 MCG/24HR IUD 1 each by Intrauterine route once.  . meclizine (ANTIVERT) 12.5 MG tablet Take 1 tablet (12.5 mg total) by mouth 3 (three) times daily as needed for dizziness.  Marland Kitchen omeprazole (PRILOSEC) 40 MG capsule Take 1 capsule (40  mg total) by mouth daily.  . polyethylene glycol powder (GLYCOLAX/MIRALAX) 17 GM/SCOOP powder Take one capful once daily.   No facility-administered encounter medications on file as of 02/09/2021.    Allergies: No Known Allergies  Family History: Family History  Problem Relation Age of Onset  . Diabetes Father   . Hypertension Father   . Diabetes Paternal Grandmother   . Diabetes Paternal Grandfather   . Diabetes Maternal Grandfather   . Asthma Maternal Grandmother   . Diabetes Maternal Grandmother     Social History: Social  History   Tobacco Use  . Smoking status: Never Smoker  . Smokeless tobacco: Never Used  Vaping Use  . Vaping Use: Never used  Substance Use Topics  . Alcohol use: No    Alcohol/week: 0.0 standard drinks  . Drug use: No   Social History   Social History Narrative   Right handed     11th grade does not do well in school    Vital Signs:  BP 137/84   Pulse 83   Ht 5\' 6"  (1.676 m)   Wt (!) 306 lb (138.8 kg)   SpO2 99%   BMI 49.39 kg/m   Neurological Exam: MENTAL STATUS including orientation to time, place, person, recent and remote memory, attention span and concentration, language, and fund of knowledge is normal.  Speech is not dysarthric.  CRANIAL NERVES: II:  No visual field defects.  .   III-IV-VI: Pupils equal round and reactive to light.  Normal conjugate, extra-ocular eye movements in all directions of gaze.  No nystagmus.  No ptosis.   V:  Normal facial sensation.    VII:  Normal facial symmetry and movements.   VIII:  Normal hearing and vestibular function.   IX-X:  Normal palatal movement.   XI:  Normal shoulder shrug and head rotation.   XII:  Normal tongue strength and range of motion, no deviation or fasciculation.  MOTOR:  No atrophy, fasciculations or abnormal movements.  No pronator drift.   Upper Extremity:  Right  Left  Deltoid  5/5   5/5   Biceps  5/5   5/5   Triceps  5/5   5/5   Infraspinatus 5/5  5/5  Medial pectoralis 5/5  5/5  Wrist extensors  5/5   5/5   Wrist flexors  5/5   5/5   Finger extensors  5/5   5/5   Finger flexors  5/5   5/5   Dorsal interossei  5/5   5/5   Abductor pollicis  5/5   5/5   Tone (Ashworth scale)  0  0   Lower Extremity:  Right  Left  Hip flexors  5/5   5/5   Hip extensors  5/5   5/5   Adductor 5/5  5/5  Abductor 5/5  5/5  Knee flexors  5/5   5/5   Knee extensors  5/5   5/5   Dorsiflexors  5/5   5/5   Plantarflexors  5/5   5/5   Toe extensors  5/5   5/5   Toe flexors  5/5   5/5   Tone (Ashworth scale)  0   0   MSRs:  Right        Left                  brachioradialis 2+  2+  biceps 2+  2+  triceps 2+  2+  patellar 2+  2+  ankle jerk 2+  2+  Hoffman no  no  plantar response down  down   SENSORY:  Normal and symmetric perception of light touch, pinprick, vibration, and proprioception.  Romberg's sign absent.   COORDINATION/GAIT: Normal finger-to- nose-finger and heel-to-shin.  Intact rapid alternating movements bilaterally.  Able to rise from a chair without using arms.  Gait narrow based and stable. Tandem and stressed gait intact.    IMPRESSION: Diffuse left arm numbness, which does not conform to a nerve distribution.  Prior testing has included NCS/EMG, MRI brain, and MRI cervical spine which has been unremarkable. I recommend that she try neck physiotherapy.  If no improvement, NCS/EMG can be repeated.   Thank you for allowing me to participate in patient's care.  If I can answer any additional questions, I would be pleased to do so.    Sincerely,    Hevin Jeffcoat K. Allena Katz, DO

## 2021-02-09 NOTE — Patient Instructions (Addendum)
Start neck physical therapy  If you choose to do nerve testing, please contact my office.  Return to clinic 4 months

## 2021-02-13 DIAGNOSIS — G4733 Obstructive sleep apnea (adult) (pediatric): Secondary | ICD-10-CM | POA: Diagnosis not present

## 2021-02-27 DIAGNOSIS — H5213 Myopia, bilateral: Secondary | ICD-10-CM | POA: Diagnosis not present

## 2021-03-02 ENCOUNTER — Ambulatory Visit: Payer: Medicaid Other | Admitting: Neurology

## 2021-03-11 DIAGNOSIS — H5213 Myopia, bilateral: Secondary | ICD-10-CM | POA: Diagnosis not present

## 2021-03-13 ENCOUNTER — Telehealth: Payer: Self-pay | Admitting: Neurology

## 2021-03-13 NOTE — Telephone Encounter (Signed)
Patient called in stating a referral was sent for her to do physical therapy. She thought it was for carpel tunnel, but it was for her neck. They mentioned something to her about it needing to be ortho.

## 2021-03-13 NOTE — Telephone Encounter (Signed)
Pt called and informed that Dr Allena Katz wants her to do neck physical therapy not go to ortho, she asked for me to call [facility] so that they will schedule her. [facility] called informed that we do not want the pt seen by ortho we want her to see physical therapy for neck PT.  They are going to call the pt to get her scheduled to start PT

## 2021-03-15 DIAGNOSIS — G4733 Obstructive sleep apnea (adult) (pediatric): Secondary | ICD-10-CM | POA: Diagnosis not present

## 2021-03-24 ENCOUNTER — Ambulatory Visit: Payer: Medicaid Other | Admitting: Family Medicine

## 2021-04-01 NOTE — Progress Notes (Signed)
    SUBJECTIVE:   CHIEF COMPLAINT / HPI:   [name] is a 20 yo who resents with nausea and heartburn. Reports stomach pain and feels bloated particularly after eating. Endorses she recently ate and feels the burn. Usually worse after eats. Occurs every day. Feels like she didn't eat even tho she has. Reports feeling worse in the morning when she wakes up. States she feels like something is coming up her throat. Will occasionally feel nauseaus. No emesis. Endorses drinking soda often between 1-3 a day. Does endorse certain triggers that cause worsening in reflux such as spicy food. Does report having used protonix in the past that was helpful. Drinks about 3 bottles of 16 ounce bottles of water a day. States normal BM once a day, but lately has been having more BMs in the past couple of days. Reports constipation with hard stools  PERTINENT  PMH / PSH:  GERD  OBJECTIVE:   BP 114/73   Pulse 88   Ht 5\' 6"  (1.676 m)   Wt (!) 305 lb 6.4 oz (138.5 kg)   SpO2 99%   BMI 49.29 kg/m    Physical exam  General: well appearing, NAD Cardiovascular: RRR, no murmurs Lungs: CTAB. Normal WOB Abdomen: soft, non-distended, non-tender Skin: warm, dry. No edema  ASSESSMENT/PLAN:   No problem-specific Assessment & Plan notes found for this encounter.   GERD  Presents with bloating and burning sensation, particularly after eating certain foods.  -Refilled protonix 40 daily  - discussed avoiding food triggers and provided patient with a list of common foods   , DO [facility]

## 2021-04-02 ENCOUNTER — Encounter: Payer: Self-pay | Admitting: Family Medicine

## 2021-04-02 ENCOUNTER — Other Ambulatory Visit: Payer: Self-pay

## 2021-04-02 ENCOUNTER — Ambulatory Visit (INDEPENDENT_AMBULATORY_CARE_PROVIDER_SITE_OTHER): Payer: Medicaid Other | Admitting: Family Medicine

## 2021-04-02 VITALS — BP 114/73 | HR 88 | Ht 66.0 in | Wt 305.4 lb

## 2021-04-02 DIAGNOSIS — K219 Gastro-esophageal reflux disease without esophagitis: Secondary | ICD-10-CM | POA: Diagnosis not present

## 2021-04-02 MED ORDER — POLYETHYLENE GLYCOL 3350 17 GM/SCOOP PO POWD
ORAL | 3 refills | Status: DC
Start: 2021-04-02 — End: 2021-04-28

## 2021-04-02 MED ORDER — OMEPRAZOLE 40 MG PO CPDR
40.0000 mg | DELAYED_RELEASE_CAPSULE | Freq: Every day | ORAL | 3 refills | Status: DC
Start: 2021-04-02 — End: 2021-04-28

## 2021-04-02 NOTE — Patient Instructions (Addendum)
It was great seeing you today!  Today you came in for stomach pain and acid reflux. I am refilling your Protonix to be taken daily, as well as Miralax for your constipation. Drinking more water will help with your constipation as well. Below are recommendations for food to avoid to help with your reflux.    Visit Reminders: - Stop by the pharmacy to pick up your prescriptions  - Continue to work on your healthy eating habits and incorporating exercise into your daily life.   Feel free to call with any questions or concerns at any time, at [phone].   Take care,  Dr. Cora Collum [facility]   Food Choices for Gastroesophageal Reflux Disease, Adult When you have gastroesophageal reflux disease (GERD), the foods you eat and your eating habits are very important. Choosing the right foods can help ease the discomfort of GERD. Consider working with a dietitian to help you make healthy food choices. What are tips for following this plan? Reading food labels  Look for foods that are low in saturated fat. Foods that have less than 5% of daily value (DV) of fat and 0 g of trans fats may help with your symptoms. Cooking  Cook foods using methods other than frying. This may include baking, steaming, grilling, or broiling. These are all methods that do not need a lot of fat for cooking.  To add flavor, try to use herbs that are low in spice and acidity. Meal planning  Choose healthy foods that are low in fat, such as fruits, vegetables, whole grains, low-fat dairy products, lean meats, fish, and poultry.  Eat frequent, small meals instead of three large meals each day. Eat your meals slowly, in a relaxed setting. Avoid bending over or lying down until 2-3 hours after eating.  Limit high-fat foods such as fatty meats or fried foods.  Limit your intake of fatty foods, such as oils, butter, and shortening.  Avoid the following as told by your health care  provider: ? Foods that cause symptoms. These may be different for different people. Keep a food diary to keep track of foods that cause symptoms. ? Alcohol. ? Drinking large amounts of liquid with meals. ? Eating meals during the 2-3 hours before bed.   Lifestyle  Maintain a healthy weight. Ask your health care provider what weight is healthy for you. If you need to lose weight, work with your health care provider to do so safely.  Exercise for at least 30 minutes on 5 or more days each week, or as told by your health care provider.  Avoid wearing clothes that fit tightly around your waist and chest.  Do not use any products that contain nicotine or tobacco. These products include cigarettes, chewing tobacco, and vaping devices, such as e-cigarettes. If you need help quitting, ask your health care provider.  Sleep with the head of your bed raised. Use a wedge under the mattress or blocks under the bed frame to raise the head of the bed.  Chew sugar-free gum after mealtimes. What foods should I eat? Eat a healthy, well-balanced diet of fruits, vegetables, whole grains, low-fat dairy products, lean meats, fish, and poultry. Each person is different. Foods that may trigger symptoms in one person may not trigger any symptoms in another person. Work with your health care provider to identify foods that are safe for you. The items listed above may not be a complete list of recommended foods and beverages. Contact  a dietitian for more information.   What foods should I avoid? Limiting some of these foods may help manage the symptoms of GERD. Everyone is different. Consult a dietitian or your health care provider to help you identify the exact foods to avoid, if any. Fruits Any fruits prepared with added fat. Any fruits that cause symptoms. For some people this may include citrus fruits, such as oranges, grapefruit, pineapple, and lemons. Vegetables Deep-fried vegetables. [place] fries. Any  vegetables prepared with added fat. Any vegetables that cause symptoms. For some people, this may include tomatoes and tomato products, chili peppers, onions and garlic, and horseradish. Grains Pastries or quick breads with added fat. Meats and other proteins High-fat meats, such as fatty beef or pork, hot dogs, ribs, ham, sausage, salami, and bacon. Fried meat or protein, including fried fish and fried chicken. Nuts and nut butters, in large amounts. Dairy Whole milk and chocolate milk. Sour cream. Cream. Ice cream. Cream cheese. Milkshakes. Fats and oils Butter. Margarine. Shortening. Ghee. Beverages Coffee and tea, with or without caffeine. Carbonated beverages. Sodas. Energy drinks. Fruit juice made with acidic fruits, such as orange or grapefruit. Tomato juice. Alcoholic drinks. Sweets and desserts Chocolate and cocoa. Donuts. Seasonings and condiments Pepper. Peppermint and spearmint. Added salt. Any condiments, herbs, or seasonings that cause symptoms. For some people, this may include curry, hot sauce, or vinegar-based salad dressings. The items listed above may not be a complete list of foods and beverages to avoid. Contact a dietitian for more information. Questions to ask your health care provider Diet and lifestyle changes are usually the first steps that are taken to manage symptoms of GERD. If diet and lifestyle changes do not improve your symptoms, talk with your health care provider about taking medicines. Where to find more information  International Foundation for Gastrointestinal Disorders: aboutgerd.org Summary  When you have gastroesophageal reflux disease (GERD), food and lifestyle choices may be very helpful in easing the discomfort of GERD.  Eat frequent, small meals instead of three large meals each day. Eat your meals slowly, in a relaxed setting. Avoid bending over or lying down until 2-3 hours after eating.  Limit high-fat foods such as fatty meats or fried  foods. This information is not intended to replace advice given to you by your health care provider. Make sure you discuss any questions you have with your health care provider. Document Revised: 05/12/2020 Document Reviewed: 05/12/2020 Elsevier Patient Education  2021 [employer].

## 2021-04-07 ENCOUNTER — Ambulatory Visit: Payer: Medicaid Other | Attending: Neurology | Admitting: Physical Therapy

## 2021-04-07 ENCOUNTER — Encounter: Payer: Self-pay | Admitting: Physical Therapy

## 2021-04-07 ENCOUNTER — Other Ambulatory Visit: Payer: Self-pay

## 2021-04-07 DIAGNOSIS — M6281 Muscle weakness (generalized): Secondary | ICD-10-CM | POA: Insufficient documentation

## 2021-04-07 DIAGNOSIS — R293 Abnormal posture: Secondary | ICD-10-CM | POA: Diagnosis not present

## 2021-04-07 DIAGNOSIS — R208 Other disturbances of skin sensation: Secondary | ICD-10-CM | POA: Insufficient documentation

## 2021-04-08 NOTE — Therapy (Signed)
[facility] [address] [place], [place], [address] Phone: [phone]   Fax:  [phone]  Physical Therapy Evaluation  Patient Details  Name: Lauren BaYensi Vente Obrien MRN: [id number] Date of Birth: Apr 07, 2001 Referring Provider (PT): [place]ale ChardPatel, Donika K, DO   Encounter Date: 04/07/2021   PT End of Session - 04/08/21 1523    Visit Number 1    Number of Visits 13   eval + 12 visits   Date for PT Re-Evaluation 07/07/21    Authorization Type [place] [employer] - have requested 12 visits    Authorization - Visit Number 0    Authorization - Number of Visits 12    PT Start Time 1530    PT Stop Time 1630    PT Time Calculation (min) 60 min    Activity Tolerance Patient tolerated treatment well    Behavior [facility] tasks assessed/performed           Past Medical History:  Diagnosis Date  . Acne vulgaris   . GERD (gastroesophageal reflux disease)   . Helicobacter pylori gastritis   . Hidradenitis suppurativa   . Neuropathy    BUE  . Pharyngitis 11/13/2019    History reviewed. No pertinent surgical history.  There were no vitals filed for this visit.    Subjective Assessment - 04/07/21 1539    Subjective Pt was referred to PT in January for dizziness; D/C to other medical issues.  Pt continues to have migraines; dizziness starts first followed by migraine and sensitivity to light.  Blue light glasses are helping but still getting migraines.  Pt reports she is not having neck pain but has been told she has "carpal tunnel" in the LUE.  Reports it has been getting worse over the past 3 years and the numbness is now progressing up her LUE.  Also reports she is having numbness in her R lower arm.  No recent injuries or trauma.  Reports she sleeps really "rough" and can wake up with a crick in her neck.    Pertinent History GERD, heliobacter pylori gastritis, bilat UE neuropathy, prediabetes, headaches,  dizziness, OSA    Limitations House hold activities;Lifting    Diagnostic tests Had MRI and nerve conduction study 2 years ago - normal.  Is nervous about having another nn conduction study done    Patient Stated Goals To determine where the numbness is coming from    Currently in Pain? No/denies              [facility] PT Assessment - 04/07/21 1552      Assessment   Medical Diagnosis Neck pain    Referring Provider (PT) Nita SicklePatel, Donika K, DO    Onset Date/Surgical Date 02/09/21    Hand Dominance Right    Prior Therapy yes      Precautions   Precautions Other (comment)    Precaution Comments GERD, heliobacter pylori gastritis, bilat UE neuropathy, prediabetes, headaches, dizziness, OSA      Restrictions   Weight Bearing Restrictions No      Balance Screen   Has the patient fallen in the past 6 months No    Has the patient had a decrease in activity level because of a fear of falling?  Yes      Home Environment   Additional Comments Difficulty with washing dishes, lifting heavy pots, cooking, lifting her son and driving.      Prior Function   Level of Independence Independent  Vocation Student    Vocation Requirements Lots of typing and writing      Observation/Other Assessments   Focus on Therapeutic Outcomes (FOTO)  N/A      Observation/Other Assessments-Edema    Edema --   none observed in LUE     Sensation   Light Touch Impaired by gross assessment    Hot/Cold Appears Intact    Proprioception Appears Intact    Additional Comments Pt reports numbness in LUE but is intact to light touch and equally bilaterally      Posture/Postural Control   Posture/Postural Control Postural limitations    Postural Limitations Rounded Shoulders;Increased thoracic kyphosis      ROM / Strength   AROM / PROM / Strength AROM;Strength      AROM   Overall AROM  Within functional limits for tasks performed    Overall AROM Comments bilat UE    AROM Assessment Site Cervical     Cervical Flexion 50    Cervical Extension 50   tingling in LUE with overpressure   Cervical - Right Side Bend 45    Cervical - Left Side Bend 45    Cervical - Right Rotation 60    Cervical - Left Rotation 60      Strength   Overall Strength Deficits    Strength Assessment Site Shoulder;Elbow;Forearm;Hand    Right/Left Shoulder Right;Left    Right Shoulder Flexion 4+/5    Right Shoulder Extension 4+/5    Right Shoulder ABduction 4+/5    Right Shoulder Internal Rotation 4+/5    Right Shoulder External Rotation 4+/5    Left Shoulder Flexion 4-/5    Left Shoulder Extension 4-/5    Left Shoulder ABduction 4-/5    Left Shoulder Internal Rotation 4-/5    Left Shoulder External Rotation 4-/5    Right/Left Elbow Right;Left    Right Elbow Flexion 5/5    Left Elbow Flexion 4-/5    Right/Left hand Right;Left    Right Hand Grip (lbs) 35    Left Hand Grip (lbs) 25.7      Flexibility   Soft Tissue Assessment /Muscle Length yes      Palpation   Palpation comment Trigger points in upper trap, levator, and lateral forearm mm on L side but no change in UE numbness or symptoms.      Special Tests   Other special tests Phalen's test: + for numbness or tingling in L hand, pressure in wrist, pain and numbess up the LUE.  Reverse Phalen's test: + for increased numbness in L hand.  Sustained pressure over L carpal tunnel + reproduction of symptoms                      Objective measurements completed on examination: See above findings.               PT Education - 04/08/21 1523    Education Details clinical findings, PT POC and goals for treatment    Person(s) Educated Patient    Methods Explanation    Comprehension Verbalized understanding            PT Short Term Goals - 04/08/21 1535      PT SHORT TERM GOAL #1   Title Pt will demonstrate ability to perform initial HEP consistently 3-4x/week    Baseline no HEP to date    Time 6    Period Weeks    Status New     Target Date 05/23/21  PT SHORT TERM GOAL #2   Title Pt will demonstrate 5 lb increase in L grip strength    Baseline 25.7lb    Time 6    Period Weeks    Status New    Target Date 05/23/21      PT SHORT TERM GOAL #3   Title Pt will participate in assessment of her posture and mechanics while typing to decrease pressure on carpal tunnel while at work and school    Baseline has not been assessed to date    Time 6    Period Weeks    Status New    Target Date 05/23/21      PT SHORT TERM GOAL #4   Title Pt will report 25% reduction in numbness/pain symptoms experienced each day    Baseline LUE severe numbness, constant    Time 6    Period Weeks    Status New    Target Date 05/23/21             PT Long Term Goals - 04/08/21 1544      PT LONG TERM GOAL #1   Title Pt will be independent with final HEP and will consistently perform 3-4x/week    Baseline no HEP established    Time 12    Period Weeks    Status New    Target Date 07/07/21      PT LONG TERM GOAL #2   Title Pt will demonstrate 10 lb improvement in L grip strength to = R grip strength and allow pt to lift heavy items when cooking and washing dishes    Baseline 25 lb; RUE: 35 lb    Time 12    Period Weeks    Status New    Target Date 07/07/21      PT LONG TERM GOAL #3   Title Pt will report 50% reduction in pain and numbness experienced each day to allow her to perform job and school work    Baseline pain/numbness 100% of the day    Time 12    Period Weeks    Status New    Target Date 07/07/21      PT LONG TERM GOAL #4   Title Pt will demonstrate improved sitting posture and computer set up for work/school to decrease repetitive pressure on bilat carpal tunnel    Baseline has not been assessed to date    Time 12    Period Weeks    Status New    Target Date 07/07/21                  Plan - 04/08/21 1525    Clinical Impression Statement Pt is a 20 year old female referred to Neuro OPPT  for evaluation of 3 year history of L carpal tunnel syndrome and progressive UE neuropathy.  Pt's PMH is significant for the following: GERD, heliobacter pylori gastritis, bilat UE neuropathy. The following deficits were noted during pt's exam: impaired sensation L > RUE, impaired UE strength L > R weakness, impaired posture, pain in LUE and hand, and decreased LUE forearm/wrist ROM consistent with carpal tunnel syndrome which is limiting patient's ability to perform home, school and job responsibilities.  No significant cervical spine impairments found at today's evaluation.  Pt would benefit from skilled PT to address these impairments and functional limitations to maximize functional independence and prevent disuse sequelae.    Personal Factors and Comorbidities Finances;Time since onset of injury/illness/exacerbation;Fitness;Other;Profession;Comorbidity 3+  Comorbidities GERD, heliobacter pylori gastritis, bilat UE neuropathy, prediabetes, headaches, dizziness, OSA    Examination-Activity Limitations Carry;Lift    Examination-Participation Restrictions Cleaning;Meal Prep;School    Stability/Clinical Decision Making Evolving/Moderate complexity    Clinical Decision Making Moderate    Rehab Potential Fair    PT Frequency 1x / week    PT Duration 12 weeks    PT Treatment/Interventions ADLs/Self Care Home Management;Cryotherapy;Electrical Stimulation;Iontophoresis 4mg /ml Dexamethasone;Moist Heat;Ultrasound;Therapeutic activities;Therapeutic exercise;Neuromuscular re-education;Patient/family education;Manual techniques;Passive range of motion;Dry needling;Taping;Joint Manipulations    PT Next Visit Plan Referral is for neck pain but main issue is carpal tunnel syndrome, L >R.  Has been ongoing for about 3 years so she is very sensitive.  Initiate gentle manual therapy, stretching, posture work.  Initiate HEP.  Look at how she sits/types as this could be main contributing factor?    Consulted and Agree  with Plan of Care Patient           Patient will benefit from skilled therapeutic intervention in order to improve the following deficits and impairments:  Decreased strength,Hypomobility,Increased fascial restricitons,Impaired sensation,Impaired UE functional use,Postural dysfunction,Pain  Visit Diagnosis: Other disturbances of skin sensation - Plan: PT plan of care cert/re-cert  Muscle weakness (generalized) - Plan: PT plan of care cert/re-cert  Abnormal posture - Plan: PT plan of care cert/re-cert     Problem List Patient Active Problem List   Diagnosis Date Noted  . Contraceptive management 12/26/2020  . COVID-19 12/11/2020  . Right wrist tendinitis 06/06/2020  . Viral URI 02/01/2020  . Class 3 severe obesity due to excess calories without serious comorbidity with body mass index (BMI) of 45.0 to 49.9 in adult (HCC) 01/22/2020  . Snoring 01/22/2020  . Chronic fatigue 01/22/2020  . Excessive daytime sleepiness 01/22/2020  . OSA (obstructive sleep apnea) 01/22/2020  . Cellulitis of breast 01/16/2020  . Oropharyngeal dysphagia 01/11/2020  . Sore throat 11/13/2019  . Folliculitis 08/29/2019  . Prediabetes 08/29/2019  . Dizziness 08/23/2019  . Memory difficulties 07/01/2019  . Chronic tension type headache 05/29/2019  . Acanthosis nigricans 05/29/2019  . 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 Hidradenitis suppurativa 03/04/2019  . Carpal tunnel syndrome of left wrist 01/04/2019  . Blurry vision, bilateral 12/16/2018  . Learning difficulty 12/16/2018  . Left wrist tendinitis 12/01/2018  . Sleep deprivation 09/28/2018  . Recurrent boils 09/21/2018  . Irregular menstrual bleeding 04/03/2018  . Acne vulgaris 04/03/2018  . Acid reflux 01/21/2016  . Allergic rhinitis 09/23/2015  . Constipation 07/24/2015   09/23/2015, PT, DPT 04/08/21    3:58 PM  [facility] [address] [place],  [place], [place] Phone: [phone]   Fax:  [phone]  Name: Lauren Obrien MRN: Lauren Obrien Date of Birth: 16-Jun-2001   Managed medicaid CPT codes: 97110- Therapeutic Exercise, [phone]- Neuro Re-education, [phone] - Manual Therapy, 97530 - Therapeutic Activities, [phone] - Self Care, 97014 - Electrical stimulation (unattended), 93716 - Electrical stimulation (Manual), [id number] - Iontophoresis and [id number] - Ultrasound

## 2021-04-15 DIAGNOSIS — G4733 Obstructive sleep apnea (adult) (pediatric): Secondary | ICD-10-CM | POA: Diagnosis not present

## 2021-04-27 ENCOUNTER — Ambulatory Visit (INDEPENDENT_AMBULATORY_CARE_PROVIDER_SITE_OTHER): Payer: Medicaid Other | Admitting: Family Medicine

## 2021-04-27 ENCOUNTER — Other Ambulatory Visit: Payer: Self-pay

## 2021-04-27 ENCOUNTER — Encounter: Payer: Self-pay | Admitting: Family Medicine

## 2021-04-27 VITALS — BP 118/82 | HR 86 | Ht 66.0 in | Wt 305.4 lb

## 2021-04-27 DIAGNOSIS — Z6841 Body Mass Index (BMI) 40.0 and over, adult: Secondary | ICD-10-CM

## 2021-04-27 DIAGNOSIS — L0293 Carbuncle, unspecified: Secondary | ICD-10-CM

## 2021-04-27 MED ORDER — CLINDAMYCIN PHOSPHATE 1 % EX GEL: Freq: Two times a day (BID) | CUTANEOUS | 0 refills | Status: DC

## 2021-04-27 NOTE — Patient Instructions (Signed)
It was great seeing you today.  For your breakouts I have sent a prescription for clindamycin topical gel, you can apply this twice daily and this should help heal quicker.  Regarding your weight below is some information on the healthy weight and wellness clinic.  You can call them to make an appointment.  We also cleaned out your ears while you are here.  We know if there is anything you need.  Be sure to follow-up with your dermatologist.  I hope you have a wonderful afternoon!       Call [phone] to make an appointment. Appointments can be made directly or via referral. All patients are required to attend a mandatory information session before starting with our clinic.  Address & Hours [facility] [address] [place]. [place],  [place]  [address] Main: [phone] Fax: [phone]  Sunday:Closed Monday:7:00 AM - 5:00 PM Tuesday:7:00 AM - 5:00 PM Wednesday:7:00 AM - 5:00 PM Thursday:7:00 AM - 5:00 PM Friday:Closed Saturday:Closed Closed: [place] Day, Memorial Day, July 4th, Labor Day, Thanksgiving Day, Christmas Day    Services [facility] Medical Weight Loss Treatment of the disease of obesity Treatment of comorbidities related to obesity Our on-site resources include: On-site lab Advanced metabolic testing to help determine individual nutritional requirements Bioimpedance scale to identify fat loss versus water loss Same-Day Appointments Unfortunately, we aren't able to accept walk-in or same-day appointments.  Appointment Guidelines You must have all new patient paperwork completed prior to arrival at your first appointment. Before your scheduled appointment with [place], you will receive two reminder phone calls. Arrive 45 minutes early for your New Patient appointment. Arrive 15 minutes early for your follow-up appointment. Bring your insurance card and a list of your current medications. If you need to cancel or reschedule your  appointment, please make sure to do so 48 hours or more in advance of your scheduled appointment to avoid a fee. After-Hours Calls To contact us after hours and on weekends, please use MyChart to send [place] a message.  Payment Information If you cannot provide proof of insurance, you will be totally responsible for payment of the bill at the time of your visit. You are expected to pay your Program Fee and copay at you New Patient visit and your copay at all follow-up visits.

## 2021-04-27 NOTE — Progress Notes (Signed)
    SUBJECTIVE:   CHIEF COMPLAINT / HPI:   Ears Patient presents reports that her ears feel like they are clogged with wax and she would like them cleaned today.  Denies any pain but reports decreased hearing.  Hydradinitis  Patient has longstanding history of acne as well as hidradenitis.  She was prescribed medications for this by dermatologist, most likely doxycycline but she is unsure and we cannot see their notes.  She reports she has a upcoming visit with them but she has not been taking the doxycycline because she is afraid that she will get sunburned.  She reports that she has hidradenitis flare at this time and would like treatment for it but will not let me examine it because her son.  We discussed treatment options.  Weight loss  Patient is interested in assistance with weight loss.  She has seen a nutritionist but has yet to adopt some of their methods for weight loss because she feels like she is too busy.  She would like to go to the healthy weight and wellness clinic.   OBJECTIVE:   BP 118/82   Pulse 86   Ht 5\' 6"  (1.676 m)   Wt (!) 305 lb 6.4 oz (138.5 kg)   SpO2 99%   BMI 49.29 kg/m   General: Well-appearing 20 year old female in no acute distress HEENT: External auditory canals occluded by cerumen bilaterally, cleaned via irrigation and clear after with no damage to the TMs Cardiac: Regular rate and rhythm, no murmurs appreciated respiratory: Normal for breathing, speaking full sentences without difficulty Derm: Patient with acne on her face.  Unable to evaluate hidradenitis flare per patient's request   ASSESSMENT/PLAN:   Recurrent boils Patient continues to have issues with abscesses/boils/hidradenitis in the groin.  She has been seen by dermatology and they recommended an oral medication but she is concerned to take the medication because she does not want to get sunburned.  She is following up with dermatology in the near future.  I have given a prescription for  clindamycin gel and she will use this until she follows up with dermatology.  Class 3 severe obesity due to excess calories without serious comorbidity with body mass index (BMI) of 45.0 to 49.9 in adult [facility]) Further discussion had with patient regarding her obesity.  She is interested in weight loss and would like to go to the healthy weight and wellness clinic.  We discussed that I cannot put a referral in for that but will give her the information and she can call and make an appointment.  Patient is agreeable to this.  Patient reports that she knows she needs to make lifestyle changes but she feels like she cannot do this because of her busy schedule.  We will follow-up on this at next visit.     [facility], MD  [facility]

## 2021-04-28 ENCOUNTER — Ambulatory Visit (INDEPENDENT_AMBULATORY_CARE_PROVIDER_SITE_OTHER): Payer: Medicaid Other | Admitting: Gastroenterology

## 2021-04-28 ENCOUNTER — Encounter: Payer: Self-pay | Admitting: Gastroenterology

## 2021-04-28 VITALS — BP 130/70 | HR 88 | Ht 66.0 in | Wt 306.6 lb

## 2021-04-28 DIAGNOSIS — K5909 Other constipation: Secondary | ICD-10-CM

## 2021-04-28 DIAGNOSIS — K219 Gastro-esophageal reflux disease without esophagitis: Secondary | ICD-10-CM | POA: Diagnosis not present

## 2021-04-28 MED ORDER — OMEPRAZOLE 40 MG PO CPDR
40.0000 mg | DELAYED_RELEASE_CAPSULE | Freq: Every day | ORAL | 11 refills | Status: DC
Start: 2021-04-28 — End: 2022-05-20

## 2021-04-28 MED ORDER — POLYETHYLENE GLYCOL 3350 17 GM/SCOOP PO POWD
ORAL | 5 refills | Status: DC
Start: 2021-04-28 — End: 2023-03-29

## 2021-04-28 NOTE — Patient Instructions (Signed)
We have sent the following medications to your pharmacy for you to pick up at your convenience: Omeprazole 40 mg daily 30-60 minutes before breakfast. Miralax 1 capful daily.   IMAGING:  You will be contacted by [facility] Scheduling (Your caller ID will indicate phone # [phone]) within the next business 7-10 business days to schedule your Upper GI series. If you have not heard from them within 7-10 business days, please call [facility] Scheduling at [phone] to follow up on the status of your appointment.  An upper GI series uses x rays to help diagnose problems of the upper GI tract, which includes the esophagus, stomach, and duodenum. The duodenum is the first part of the small intestine. An upper GI series is conducted by a radiology technologist or a radiologist--a doctor who specializes in x-ray imaging--at a hospital or outpatient center. While sitting or standing in front of an x-ray machine, the patient drinks barium liquid, which is often white and has a chalky consistency and taste. The barium liquid coats the lining of the upper GI tract and makes signs of disease show up more clearly on x rays. X-ray video, called fluoroscopy, is used to view the barium liquid moving through the esophagus, stomach, and duodenum. Additional x rays and fluoroscopy are performed while the patient lies on an x-ray table. To fully coat the upper GI tract with barium liquid, the technologist or radiologist may press on the abdomen or ask the patient to change position. Patients hold still in various positions, allowing the technologist or radiologist to take x rays of the upper GI tract at different angles. If a technologist conducts the upper GI series, a radiologist will later examine the images to look for problems.  This test typically takes about 1 hour to complete. ___________________________________________________________   Gastroesophageal Reflux Disease,  Adult  Gastroesophageal reflux (GER) happens when acid from the stomach flows up into the tube that connects the mouth and the stomach (esophagus). Normally, food travels down the esophagus and stays in the stomach to be digested. With GER, food and stomach acid sometimes move back up into theesophagus. You may have a disease called gastroesophageal reflux disease (GERD) if the reflux: Happens often. Causes frequent or very bad symptoms. Causes problems such as damage to the esophagus. When this happens, the esophagus becomes sore and swollen. Over time, GERD can make small holes (ulcers) in the lining of the esophagus. What are the causes? This condition is caused by a problem with the muscle between the esophagus and the stomach. When this muscle is weak or not normal, it does not close properlyto keep food and acid from coming back up from the stomach. The muscle can be weak because of: Tobacco use. Pregnancy. Having a certain type of hernia (hiatal hernia). Alcohol use. Certain foods and drinks, such as coffee, chocolate, onions, and peppermint. What increases the risk? Being overweight. Having a disease that affects your connective tissue. Taking NSAIDs, such a ibuprofen. What are the signs or symptoms? Heartburn. Difficult or painful swallowing. The feeling of having a lump in the throat. A bitter taste in the mouth. Bad breath. Having a lot of saliva. Having an upset or bloated stomach. Burping. Chest pain. Different conditions can cause chest pain. Make sure you see your doctor if you have chest pain. Shortness of breath or wheezing. A long-term cough or a cough at night. Wearing away of the surface of teeth (tooth enamel). Weight loss. How is this treated?  Making changes to your diet. Taking medicine. Having surgery. Treatment will depend on how bad your symptoms are. Follow these instructions at home: Eating and drinking  Follow a diet as told by your doctor. You  may need to avoid foods and drinks such as: Coffee and tea, with or without caffeine. Drinks that contain alcohol. Energy drinks and sports drinks. Bubbly (carbonated) drinks or sodas. Chocolate and cocoa. Peppermint and mint flavorings. Garlic and onions. Horseradish. Spicy and acidic foods. These include peppers, chili powder, curry powder, vinegar, hot sauces, and BBQ sauce. Citrus fruit juices and citrus fruits, such as oranges, lemons, and limes. Tomato-based foods. These include red sauce, chili, salsa, and pizza with red sauce. Fried and fatty foods. These include donuts, french fries, potato chips, and high-fat dressings. High-fat meats. These include hot dogs, rib eye steak, sausage, ham, and bacon. High-fat dairy items, such as whole milk, butter, and cream cheese. Eat small meals often. Avoid eating large meals. Avoid drinking large amounts of liquid with your meals. Avoid eating meals during the 2-3 hours before bedtime. Avoid lying down right after you eat. Do not exercise right after you eat.  Lifestyle  Do not smoke or use any products that contain nicotine or tobacco. If you need help quitting, ask your doctor. Try to lower your stress. If you need help doing this, ask your doctor. If you are overweight, lose an amount of weight that is healthy for you. Ask your doctor about a safe weight loss goal.  General instructions Pay attention to any changes in your symptoms. Take over-the-counter and prescription medicines only as told by your doctor. Do not take aspirin, ibuprofen, or other NSAIDs unless your doctor says it is okay. Wear loose clothes. Do not wear anything tight around your waist. Raise (elevate) the head of your bed about 6 inches (15 cm). You may need to use a wedge to do this. Avoid bending over if this makes your symptoms worse. Keep all follow-up visits. Contact a doctor if: You have new symptoms. You lose weight and you do not know why. You have  trouble swallowing or it hurts to swallow. You have wheezing or a cough that keeps happening. You have a hoarse voice. Your symptoms do not get better with treatment. Get help right away if: You have sudden pain in your arms, neck, jaw, teeth, or back. You suddenly feel sweaty, dizzy, or light-headed. You have chest pain or shortness of breath. You vomit and the vomit is green, yellow, or black, or it looks like blood or coffee grounds. You faint. Your poop (stool) is red, bloody, or black. You cannot swallow, drink, or eat. These symptoms may represent a serious problem that is an emergency. Do not wait to see if the symptoms will go away. Get medical help right away. Call your local emergency services (911 in the [place] Do not drive yourself to the hospital. Summary If a person has gastroesophageal reflux disease (GERD), food and stomach acid move back up into the esophagus and cause symptoms or problems such as damage to the esophagus. Treatment will depend on how bad your symptoms are. Follow a diet as told by your doctor. Take all medicines only as told by your doctor. This information is not intended to replace advice given to you by your health care provider. Make sure you discuss any questions you have with your healthcare provider. Document Revised: 05/12/2020 Document Reviewed: 05/12/2020 Elsevier Patient Education  2022 [employer].

## 2021-04-28 NOTE — Assessment & Plan Note (Signed)
Patient continues to have issues with abscesses/boils/hidradenitis in the groin.  She has been seen by dermatology and they recommended an oral medication but she is concerned to take the medication because she does not want to get sunburned.  She is following up with dermatology in the near future.  I have given a prescription for clindamycin gel and she will use this until she follows up with dermatology.

## 2021-04-28 NOTE — Progress Notes (Signed)
Assessment and plan noted ?

## 2021-04-28 NOTE — Assessment & Plan Note (Signed)
Further discussion had with patient regarding her obesity.  She is interested in weight loss and would like to go to the [facility] weight [facility] wellness clinic.  We discussed that I cannot put a referral in for that but will give her the information and she can call and make an appointment.  Patient is agreeable to this.  Patient reports that she knows she needs to make lifestyle changes but she feels like she cannot do this because of her busy schedule.  We will follow-up on this at next visit.

## 2021-04-28 NOTE — Progress Notes (Signed)
04/28/2021 Lauren Obrien [id number] 2001/05/28   HISTORY OF PRESENT ILLNESS: This is a 20 year old female who has been seen by both me and Willette Cluster, NP, over the past year for issues with reflux and constipation.  Had been treated for H. pylori in the past and follow-up stool antigen last year was negative.  Her last visit here was in August 2021.  Essentially it sounds like she had gone off of her PPI for quite some time.  Recently she started feeling poorly again with epigastric abdominal discomfort and a lot of refluxate.  She has started back on omeprazole 40 mg daily about 1 month ago.  She has noticed some improvement in her symptoms since then.  She is not a great historian.  She says "I really do not know how I am feeling to be honest".  She reports bubbling or gurgling sensation coming up into her esophagus.  That has improved it sounds like since being back on the omeprazole.  She is asking if she needs to take this forever.  She denies any dysphagia.  She never had EGD in the past and she has been nervous about sedation since she is never been put to sleep.  We also again discussed her constipation.  She has tried MiraLAX in the past, but tells me that she has had issues with the taste and the consistency of it.  She says that she used to take it with chocolate milk and did fine with that, but now she was told that she should not be drinking chocolate milk regularly.  She is seeing a dietitian in the near future.   Past Medical History:  Diagnosis Date   Acne vulgaris    GERD (gastroesophageal reflux disease)    Helicobacter pylori gastritis    Hidradenitis suppurativa    Neuropathy    BUE   Pharyngitis 11/13/2019   History reviewed. No pertinent surgical history.  reports that she has never smoked. She has never used smokeless tobacco. She reports that she does not drink alcohol and does not use drugs. family history includes Asthma in her maternal grandmother;  Diabetes in her father, maternal grandfather, maternal grandmother, paternal grandfather, and paternal grandmother; Hypertension in her father. No Known Allergies    Outpatient Encounter Medications as of 04/28/2021  Medication Sig   cetirizine (ZYRTEC) 10 MG tablet Take 1 tablet (10 mg total) by mouth daily as needed for allergies.   clindamycin (CLINDAGEL) 1 % gel Apply topically 2 (two) times daily.   fluticasone (FLONASE) 50 MCG/ACT nasal spray Place 2 sprays into both nostrils as needed.   ibuprofen (ADVIL) 400 MG tablet Take 1 tablet (400 mg total) by mouth every 8 (eight) hours as needed.   levonorgestrel (MIRENA) 20 MCG/24HR IUD 1 each by Intrauterine route once.   meclizine (ANTIVERT) 12.5 MG tablet Take 1 tablet (12.5 mg total) by mouth 3 (three) times daily as needed for dizziness.   [DISCONTINUED] omeprazole (PRILOSEC) 40 MG capsule Take 1 capsule (40 mg total) by mouth daily.   [DISCONTINUED] polyethylene glycol powder (GLYCOLAX/MIRALAX) 17 GM/SCOOP powder Take one capful once daily.   omeprazole (PRILOSEC) 40 MG capsule Take 1 capsule (40 mg total) by mouth daily.   polyethylene glycol powder (GLYCOLAX/MIRALAX) 17 GM/SCOOP powder Take one capful once daily.   No facility-administered encounter medications on file as of 04/28/2021.    REVIEW OF SYSTEMS  : All other systems reviewed and negative except where noted in the History of  Present Illness.   PHYSICAL EXAM: BP 130/70   Pulse 88   Ht 5\' 6"  (1.676 m)   Wt (!) 306 lb 9.6 oz (139.1 kg)   SpO2 98%   BMI 49.49 kg/m  General: Well developed Hispanic female in no acute distress Head: Normocephalic and atraumatic Eyes:  Sclerae anicteric, conjunctiva pink. Ears: Normal auditory acuity Lungs: Clear throughout to auscultation; no W/R/R. Heart: Regular rate and rhythm; no M/R/G. Abdomen: Soft, non-distended.  BS present.  Minimal epigastric TTP. Musculoskeletal: Symmetrical with no gross deformities  Skin: No lesions on  visible extremities Extremities: No edema  Neurological: Alert oriented x 4, grossly non-focal Psychological:  Alert and cooperative. Normal mood and affect  ASSESSMENT AND PLAN: *GERD with epigastric abdominal discomfort: Had been off of her PPI.  Just restarted about a month ago and has noticed some improvement in symptoms.  Had a long discussion about weight loss, dietary changes, etc.  New prescription sent to her pharmacy for her to continue.  She was given literature on dietary and lifestyle changes.  She is apparently meeting with a dietitian soon as well.  Never had an EGD.  Is nervous about sedation as she has never been put to sleep.  We will start with upper GI series. *Chronic constipation: She has had issues with MiraLAX due to the taste/consistency.  Recommend she try in 8 ounces of Gatorade each day.  She was agreeable to try that.  Could do half capful daily if full dose seems to be too much.   CC:  , MD

## 2021-04-29 ENCOUNTER — Ambulatory Visit (INDEPENDENT_AMBULATORY_CARE_PROVIDER_SITE_OTHER): Payer: Medicaid Other | Admitting: Family Medicine

## 2021-04-29 ENCOUNTER — Encounter: Payer: Self-pay | Admitting: Family Medicine

## 2021-04-29 ENCOUNTER — Ambulatory Visit: Payer: Self-pay

## 2021-04-29 ENCOUNTER — Other Ambulatory Visit: Payer: Self-pay

## 2021-04-29 VITALS — BP 126/78 | Ht 66.0 in | Wt 305.0 lb

## 2021-04-29 DIAGNOSIS — M79602 Pain in left arm: Secondary | ICD-10-CM

## 2021-04-29 DIAGNOSIS — M79601 Pain in right arm: Secondary | ICD-10-CM | POA: Diagnosis not present

## 2021-04-29 NOTE — Progress Notes (Signed)
PCP: Derrel Nip, MD  Subjective:   HPI: Patient is a 20 y.o. female here for evaluation of left hand numbness and tingling.  She reports that over the past few years, she has had numbness and tingling in her left hand and in her left arm.  She was seen here for this issue on 01/17/2019, at that time was diagnosed with carpal tunnel syndrome, and at follow-up on 04/16/2019 it appears that she developed more full upper extremity symptoms.  She was referred for EMG/NCS studies which were normal.  Since then, she has seen multiple providers including neurologist, who obtained MRI brain, MRI cervical spine, NCS/EMG of the left arm.  MRI brain was relatively unremarkable, MRI cervical spine was normal, and nerve conduction studies were normal.  She was referred for neck PT which she reports has not been significantly helpful and her PT raise the possibility of carpal tunnel syndrome given the majority of her symptoms are in her hand.  Today, patient reports that the most significant symptoms are in her hand diffusely.  She has pain with prolonged flexion of her wrist and also notices pain when she is typing.  She reports that she was told by one of her providers that she may require a another NCS which she is not excited to do again.  She is wondering if we can ultrasound it today to get more information.   Review of Systems:  Per HPI.   PMFSH, medications and smoking status reviewed.      Objective:  Physical Exam:  No flowsheet data found.   Gen: awake, alert, NAD, comfortable in exam room Pulm: breathing unlabored  Left wrist/arm: -Inspection: No erythema edema ecchymoses or atrophy of the left arm or hand. -Palpation: No significant tenderness to palpation, she does have reproduction of hand pain with pressure over the median nerve on the palmar aspect of the wrist. -ROM: Full range of motion of the shoulder, elbow and wrist -Strength: Full strength of the left upper extremity -Normal  reflexes in the left upper extremity -Positive Phalen's, positive Tinel's at the median nerve  Limited US examination of left wrist with focus on median nerve shows flattening of the median nerve as it traverses the flexor retinaculum.  Area of the median nerve is 42mm, compared to 6 mm on the contralateral side.   Assessment & Plan:  1.  Left arm and hand numbness and tingling  Differential includes carpal tunnel syndrome, cervical radiculopathy, brachial plexopathy, among others.  We discussed that is possible she has multiple etiology of pain, her positive provocative maneuvers for carpal tunnel syndrome and ultrasound findings of flattening of the median nerve as it traverses the carpal tunnel do raise a question of CTS.  However, ultrasound does not conclusive for diagnosis of CTS, especially in the setting of relatively normal median nerve area.  I also would not expect her to have full arm numbness and tingling and therefore it does not fully explain her symptoms.  We discussed that we could do a diagnostic/therapeutic carpal tunnel injection.  Unfortunately, she had to leave for a different appointment this was not able to be performed today, we will plan to have her follow-up in 1 to 2 weeks to discuss further and decide whether she would like to proceed with carpal tunnel injection.   Guy Sandifer, MD [facility] Fellow 04/29/2021 4:57 PM  Addendum:  I was the preceptor for this visit and available for immediate consultation.  Norton Blizzard MD Marrianne Mood

## 2021-05-04 ENCOUNTER — Telehealth: Payer: Self-pay

## 2021-05-04 NOTE — Telephone Encounter (Signed)
Received fax from pharmacy, PA needed on Clindamycin gel.  Clinical questions submitted via Cover My Meds.  Waiting on response, could take up to 72 hours.  Cover My Meds info: Key: [id number]    Veronda Prude, RN

## 2021-05-04 NOTE — Telephone Encounter (Signed)
   Called pharmacy with approval. Called patient and provided with update.   Veronda Prude, RN

## 2021-05-08 ENCOUNTER — Other Ambulatory Visit: Payer: Self-pay

## 2021-05-08 ENCOUNTER — Ambulatory Visit: Payer: Medicaid Other | Attending: Neurology

## 2021-05-08 DIAGNOSIS — M6281 Muscle weakness (generalized): Secondary | ICD-10-CM | POA: Diagnosis not present

## 2021-05-08 DIAGNOSIS — R293 Abnormal posture: Secondary | ICD-10-CM

## 2021-05-08 DIAGNOSIS — R208 Other disturbances of skin sensation: Secondary | ICD-10-CM

## 2021-05-09 NOTE — Therapy (Addendum)
[facility] [address] [place], [place], [address] Phone: [phone]   Fax:  [phone]  Physical Therapy Treatment  Patient Details  Name: Lauren Obrien MRN: [id number] Date of Birth: 2001/09/10 Referring Provider (PT): Glendale Chard, DO   Encounter Date: 05/08/2021   PT End of Session - 05/08/21 1920     Visit Number 2    Number of Visits 13   eval + 12 visits   Date for PT Re-Evaluation 07/07/21    Authorization Type [place] Medicaid Healthy Blue - have requested 12 visits    Authorization - Visit Number 0    Authorization - Number of Visits 12    PT Start Time 1540    PT Stop Time 1620    PT Time Calculation (min) 40 min    Activity Tolerance Patient tolerated treatment well    Behavior During Therapy [facility] for tasks assessed/performed             Past Medical History:  Diagnosis Date   Acne vulgaris    GERD (gastroesophageal reflux disease)    Helicobacter pylori gastritis    Hidradenitis suppurativa    Neuropathy    BUE   Pharyngitis 11/13/2019    History reviewed. No pertinent surgical history.  There were no vitals filed for this visit.   Subjective Assessment - 05/08/21 1545     Subjective Reports numbness in L wrist, worse with typing and activity    Pertinent History GERD, heliobacter pylori gastritis, bilat UE neuropathy, prediabetes, headaches, dizziness, OSA    Limitations House hold activities;Lifting    Diagnostic tests Had MRI and nerve conduction study 2 years ago - normal.  Is nervous about having another nn conduction study done    Patient Stated Goals To determine where the numbness is coming from                05/11/21 0001  Self-Care  Self-Care ADL's;Other Self-Care Comments  ADL's reviewed posture in sitting  Other Self-Care Comments  assessed circulation in L wrist  Shoulder Exercises: Seated  Retraction Strengthening;10 reps  Shoulder Exercises: Stretch   Other Shoulder Stretches doorway stretch at 3 positions, 30s hold   Majority of session spent answering patient questions as she was apprehensive regarding therapy for fear of pain.  Patient found to have + Allen test in L wrist, ulnar artery bias.  Established HEP for shoulder stretching, assessment of first rib - B for symptoms                          PT Short Term Goals - 04/08/21 1535       PT SHORT TERM GOAL #1   Title Pt will demonstrate ability to perform initial HEP consistently 3-4x/week    Baseline no HEP to date    Time 6    Period Weeks    Status New    Target Date 05/23/21      PT SHORT TERM GOAL #2   Title Pt will demonstrate 5 lb increase in L grip strength    Baseline 25.7lb    Time 6    Period Weeks    Status New    Target Date 05/23/21      PT SHORT TERM GOAL #3   Title Pt will participate in assessment of her posture and mechanics while typing to decrease pressure on carpal tunnel while at work and school    Baseline  has not been assessed to date    Time 6    Period Weeks    Status New    Target Date 05/23/21      PT SHORT TERM GOAL #4   Title Pt will report 25% reduction in numbness/pain symptoms experienced each day    Baseline LUE severe numbness, constant    Time 6    Period Weeks    Status New    Target Date 05/23/21               PT Long Term Goals - 04/08/21 1544       PT LONG TERM GOAL #1   Title Pt will be independent with final HEP and will consistently perform 3-4x/week    Baseline no HEP established    Time 12    Period Weeks    Status New    Target Date 07/07/21      PT LONG TERM GOAL #2   Title Pt will demonstrate 10 lb improvement in L grip strength to = R grip strength and allow pt to lift heavy items when cooking and washing dishes    Baseline 25 lb; RUE: 35 lb    Time 12    Period Weeks    Status New    Target Date 07/07/21      PT LONG TERM GOAL #3   Title Pt will report 50% reduction in  pain and numbness experienced each day to allow her to perform job and school work    Baseline pain/numbness 100% of the day    Time 12    Period Weeks    Status New    Target Date 07/07/21      PT LONG TERM GOAL #4   Title Pt will demonstrate improved sitting posture and computer set up for work/school to decrease repetitive pressure on bilat carpal tunnel    Baseline has not been assessed to date    Time 12    Period Weeks    Status New    Target Date 07/07/21                   Plan - 05/08/21 1920     Personal Factors and Comorbidities Finances;Time since onset of injury/illness/exacerbation;Fitness;Other;Profession;Comorbidity 3+    Comorbidities GERD, heliobacter pylori gastritis, bilat UE neuropathy, prediabetes, headaches, dizziness, OSA    Examination-Activity Limitations Carry;Lift    Examination-Participation Restrictions Cleaning;Meal Prep;School    Stability/Clinical Decision Making Evolving/Moderate complexity    Rehab Potential Fair    PT Frequency 1x / week    PT Duration 12 weeks    PT Treatment/Interventions ADLs/Self Care Home Management;Cryotherapy;Electrical Stimulation;Iontophoresis 4mg /ml Dexamethasone;Moist Heat;Ultrasound;Therapeutic activities;Therapeutic exercise;Neuromuscular re-education;Patient/family education;Manual techniques;Passive range of motion;Dry needling;Taping;Joint Manipulations    PT Next Visit Plan Referral is for neck pain but main issue is carpal tunnel syndrome, L >R.  Has been ongoing for about 3 years so she is very sensitive.  Initiate gentle manual therapy, stretching, posture work.  Initiate HEP.  Look at how she sits/types as this could be main contributing factor?    Consulted and Agree with Plan of Care Patient             Patient will benefit from skilled therapeutic intervention in order to improve the following deficits and impairments:  Decreased strength, Hypomobility, Increased fascial restricitons, Impaired  sensation, Impaired UE functional use, Postural dysfunction, Pain  Visit Diagnosis: Other disturbances of skin sensation  Muscle weakness (generalized)  Abnormal posture  Problem List Patient Active Problem List   Diagnosis Date Noted   Contraceptive management 12/26/2020   COVID-19 12/11/2020   Right wrist tendinitis 06/06/2020   Viral URI 02/01/2020   Class 3 severe obesity due to excess calories without serious comorbidity with body mass index (BMI) of 45.0 to 49.9 in adult (HCC) 01/22/2020   Snoring 01/22/2020   Chronic fatigue 01/22/2020   Excessive daytime sleepiness 01/22/2020   OSA (obstructive sleep apnea) 01/22/2020   Cellulitis of breast 01/16/2020   Oropharyngeal dysphagia 01/11/2020   Sore throat 11/13/2019   Folliculitis 08/29/2019   Prediabetes 08/29/2019   Dizziness 08/23/2019   Memory difficulties 07/01/2019   Chronic tension type headache 05/29/2019   Acanthosis nigricans 05/29/2019   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   Hidradenitis suppurativa 03/04/2019   Carpal tunnel syndrome of left wrist 01/04/2019   Blurry vision, bilateral 12/16/2018   Learning difficulty 12/16/2018   Left wrist tendinitis 12/01/2018   Sleep deprivation 09/28/2018   Recurrent boils 09/21/2018   Irregular menstrual bleeding 04/03/2018   Acne vulgaris 04/03/2018   Acid reflux 01/21/2016   Allergic rhinitis 09/23/2015   Chronic constipation 07/24/2015    Hildred Laser 05/09/2021, 7:22 PM  [facility] [address] [place], [place], [address] Phone: [phone]   Fax:  [phone]  Name: Lauren Obrien MRN: [id number] Date of Birth: [birth year]

## 2021-05-11 ENCOUNTER — Ambulatory Visit (HOSPITAL_COMMUNITY)
Admission: RE | Admit: 2021-05-11 | Discharge: 2021-05-11 | Disposition: A | Discharge status: Home / Self Care | Payer: Medicaid Other | Source: Ambulatory Visit | Attending: Gastroenterology | Admitting: Gastroenterology

## 2021-05-11 ENCOUNTER — Other Ambulatory Visit: Payer: Self-pay

## 2021-05-11 DIAGNOSIS — Z0389 Encounter for observation for other suspected diseases and conditions ruled out: Secondary | ICD-10-CM | POA: Diagnosis not present

## 2021-05-11 DIAGNOSIS — K219 Gastro-esophageal reflux disease without esophagitis: Secondary | ICD-10-CM | POA: Diagnosis not present

## 2021-05-12 DIAGNOSIS — G4733 Obstructive sleep apnea (adult) (pediatric): Secondary | ICD-10-CM | POA: Diagnosis not present

## 2021-05-14 DIAGNOSIS — L700 Acne vulgaris: Secondary | ICD-10-CM | POA: Diagnosis not present

## 2021-05-14 DIAGNOSIS — L218 Other seborrheic dermatitis: Secondary | ICD-10-CM | POA: Diagnosis not present

## 2021-05-15 ENCOUNTER — Other Ambulatory Visit: Payer: Self-pay

## 2021-05-15 ENCOUNTER — Ambulatory Visit: Payer: Medicaid Other | Attending: Neurology

## 2021-05-15 DIAGNOSIS — R293 Abnormal posture: Secondary | ICD-10-CM | POA: Diagnosis not present

## 2021-05-15 DIAGNOSIS — M6281 Muscle weakness (generalized): Secondary | ICD-10-CM | POA: Insufficient documentation

## 2021-05-15 DIAGNOSIS — R208 Other disturbances of skin sensation: Secondary | ICD-10-CM | POA: Insufficient documentation

## 2021-05-15 NOTE — Therapy (Signed)
[facility] [address] [place], [place], [address] Phone: [phone]   Fax:  [phone]  Physical Therapy Treatment  Patient Details  Name: Lauren Obrien MRN: [id number] Date of Birth: 08/19/2001 Referring Provider (PT): Glendale Chard, DO   Encounter Date: 05/15/2021   PT End of Session - 05/15/21 1455     Visit Number 3    Number of Visits 13    Date for PT Re-Evaluation 07/07/21    Authorization Type [place] Medicaid Healthy Blue - have requested 12 visits    Authorization - Number of Visits 12    Progress Note Due on Visit 10    PT Start Time 1450    PT Stop Time 1530    PT Time Calculation (min) 40 min    Activity Tolerance Patient tolerated treatment well    Behavior During Therapy [facility] for tasks assessed/performed             Past Medical History:  Diagnosis Date   Acne vulgaris    GERD (gastroesophageal reflux disease)    Helicobacter pylori gastritis    Hidradenitis suppurativa    Neuropathy    BUE   Pharyngitis 11/13/2019    History reviewed. No pertinent surgical history.  There were no vitals filed for this visit.   Subjective Assessment - 05/15/21 1453     Subjective Reports no change in symptoms, feels she may have taken meds prior to last session and masked symptoms, 600mg s ibuprofen    Pertinent History GERD, heliobacter pylori gastritis, bilat UE neuropathy, prediabetes, headaches, dizziness, OSA    Limitations House hold activities;Lifting    Diagnostic tests Had MRI and nerve conduction study 2 years ago - normal.  Is nervous about having another nn conduction study done    Patient Stated Goals To determine where the numbness is coming from                               [facility] Adult PT Treatment/Exercise - 05/15/21 0001       Self-Care   Self-Care ADL's;Other Self-Care Comments    ADL's reviewed posture in sitting    Other Self-Care Comments  assessed  circulation in L wrist      Shoulder Exercises: Seated   Retraction Strengthening;10 reps      Shoulder Exercises: Stretch   Other Shoulder Stretches doorway stretch at 3 positions, 30s hold      Manual Therapy   Manual Therapy Soft tissue mobilization;Neural Stretch    Manual therapy comments STM to L pec minor and scalene stertches to all 3 heads, 30s hold                      PT Short Term Goals - 05/08/21 1039       PT SHORT TERM GOAL #1   Title Pt will demonstrate ability to perform initial HEP consistently 3-4x/week    Baseline no HEP to date; 05/08/21 HEP issued    Time 6    Period Weeks    Status New    Target Date 05/23/21      PT SHORT TERM GOAL #2   Title Pt will demonstrate 5 lb increase in L grip strength    Baseline 25.7lb    Time 6    Period Weeks    Status New    Target Date 05/23/21      PT SHORT  TERM GOAL #3   Title Pt will participate in assessment of her posture and mechanics while typing to decrease pressure on carpal tunnel while at work and school    Baseline has not been assessed to date    Time 6    Period Weeks    Status New    Target Date 05/23/21      PT SHORT TERM GOAL #4   Title Pt will report 25% reduction in numbness/pain symptoms experienced each day    Baseline LUE severe numbness, constant    Time 6    Period Weeks    Status New    Target Date 05/23/21               PT Long Term Goals - 04/08/21 1544       PT LONG TERM GOAL #1   Title Pt will be independent with final HEP and will consistently perform 3-4x/week    Baseline no HEP established    Time 12    Period Weeks    Status New    Target Date 07/07/21      PT LONG TERM GOAL #2   Title Pt will demonstrate 10 lb improvement in L grip strength to = R grip strength and allow pt to lift heavy items when cooking and washing dishes    Baseline 25 lb; RUE: 35 lb    Time 12    Period Weeks    Status New    Target Date 07/07/21      PT LONG TERM GOAL #3    Title Pt will report 50% reduction in pain and numbness experienced each day to allow her to perform job and school work    Baseline pain/numbness 100% of the day    Time 12    Period Weeks    Status New    Target Date 07/07/21      PT LONG TERM GOAL #4   Title Pt will demonstrate improved sitting posture and computer set up for work/school to decrease repetitive pressure on bilat carpal tunnel    Baseline has not been assessed to date    Time 12    Period Weeks    Status New    Target Date 07/07/21                   Plan - 05/15/21 1533     Clinical Impression Statement Todays session focused on soft tissue assessment to cervical and L shoulder region due to suspected TOS, tender to L scalene group and pec minor, stretching and soft tissue mobs elicited LUE symptoms, L shoulder ROM WNL, reviewed HEP and instructed to lessen stretch effort, 1st rib signs (-) B    Personal Factors and Comorbidities Finances;Time since onset of injury/illness/exacerbation;Fitness;Other;Profession;Comorbidity 3+    Comorbidities GERD, heliobacter pylori gastritis, bilat UE neuropathy, prediabetes, headaches, dizziness, OSA    Examination-Activity Limitations Carry;Lift    Examination-Participation Restrictions Cleaning;Meal Prep;School    Stability/Clinical Decision Making Evolving/Moderate complexity    Rehab Potential Fair    PT Frequency 1x / week    PT Duration 12 weeks    PT Treatment/Interventions ADLs/Self Care Home Management;Cryotherapy;Electrical Stimulation;Iontophoresis 4mg /ml Dexamethasone;Moist Heat;Ultrasound;Therapeutic activities;Therapeutic exercise;Neuromuscular re-education;Patient/family education;Manual techniques;Passive range of motion;Dry needling;Taping;Joint Manipulations    PT Next Visit Plan Follow up on HEP, review stretches, soft tissue work to L neck and anterior shoulder girdle to correct anterior shoulder position L    PT Home Exercise Plan  Consulted and Agree with Plan of Care Patient             Patient will benefit from skilled therapeutic intervention in order to improve the following deficits and impairments:  Decreased strength, Hypomobility, Increased fascial restricitons, Impaired sensation, Impaired UE functional use, Postural dysfunction, Pain  Visit Diagnosis: Other disturbances of skin sensation  Muscle weakness (generalized)  Abnormal posture     Problem List Patient Active Problem List   Diagnosis Date Noted   Contraceptive management 12/26/2020   COVID-19 12/11/2020   Right wrist tendinitis 06/06/2020   Viral URI 02/01/2020   Class 3 severe obesity due to excess calories without serious comorbidity with body mass index (BMI) of 45.0 to 49.9 in adult (HCC) 01/22/2020   Snoring 01/22/2020   Chronic fatigue 01/22/2020   Excessive daytime sleepiness 01/22/2020   OSA (obstructive sleep apnea) 01/22/2020   Cellulitis of breast 01/16/2020   Oropharyngeal dysphagia 01/11/2020   Sore throat 11/13/2019   Folliculitis 08/29/2019   Prediabetes 08/29/2019   Dizziness 08/23/2019   Memory difficulties 07/01/2019   Chronic tension type headache 05/29/2019   Acanthosis nigricans 05/29/2019   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   Hidradenitis suppurativa 03/04/2019   Carpal tunnel syndrome of left wrist 01/04/2019   Blurry vision, bilateral 12/16/2018   Learning difficulty 12/16/2018   Left wrist tendinitis 12/01/2018   Sleep deprivation 09/28/2018   Recurrent boils 09/21/2018   Irregular menstrual bleeding 04/03/2018   Acne vulgaris 04/03/2018   Acid reflux 01/21/2016   Allergic rhinitis 09/23/2015   Chronic constipation 07/24/2015    Hildred Laser PT 05/15/2021, 4:30 PM  [facility] [address] [place], [place], [address] Phone: [phone]   Fax:  [phone]  Name: Lauren Obrien MRN: [id number] Date of  Birth: [birth year]

## 2021-05-21 ENCOUNTER — Ambulatory Visit: Payer: Medicaid Other

## 2021-05-21 ENCOUNTER — Other Ambulatory Visit: Payer: Self-pay

## 2021-05-21 DIAGNOSIS — M6281 Muscle weakness (generalized): Secondary | ICD-10-CM

## 2021-05-21 DIAGNOSIS — R293 Abnormal posture: Secondary | ICD-10-CM | POA: Diagnosis not present

## 2021-05-21 DIAGNOSIS — R208 Other disturbances of skin sensation: Secondary | ICD-10-CM

## 2021-05-21 NOTE — Therapy (Signed)
[facility] [address] [place], [place], [address] Phone: [phone]   Fax:  [phone]  Physical Therapy Treatment  Patient Details  Name: Lauren Obrien MRN: [id number] Date of Birth: 07-04-2001 Referring Provider (PT): Glendale Chard, DO   Encounter Date: 05/21/2021   PT End of Session - 05/21/21 1620     Visit Number 4    Number of Visits 13    Date for PT Re-Evaluation 07/07/21    Authorization Type [place] Medicaid Healthy Blue - have requested 12 visits    Authorization - Number of Visits 12    Progress Note Due on Visit 10    PT Start Time 1615    PT Stop Time 1700    PT Time Calculation (min) 45 min    Activity Tolerance Patient tolerated treatment well    Behavior During Therapy [facility] for tasks assessed/performed             Past Medical History:  Diagnosis Date   Acne vulgaris    GERD (gastroesophageal reflux disease)    Helicobacter pylori gastritis    Hidradenitis suppurativa    Neuropathy    BUE   Pharyngitis 11/13/2019    History reviewed. No pertinent surgical history.  There were no vitals filed for this visit.   Subjective Assessment - 05/21/21 1618     Subjective Feels numbness is slowly improving, has had some wrist pain around her thumb    Pertinent History GERD, heliobacter pylori gastritis, bilat UE neuropathy, prediabetes, headaches, dizziness, OSA    Limitations House hold activities;Lifting    Diagnostic tests Had MRI and nerve conduction study 2 years ago - normal.  Is nervous about having another nn conduction study done    Patient Stated Goals To determine where the numbness is coming from                               [facility] Adult PT Treatment/Exercise - 05/21/21 0001       Shoulder Exercises: Seated   Extension Strengthening;Both;10 reps;Theraband    Theraband Level (Shoulder Extension) Level 1 (Yellow)    Retraction Strengthening;Both;10  reps;Theraband    Theraband Level (Shoulder Retraction) Level 1 (Yellow)    Retraction Limitations 10 reps    Row Strengthening;Both;10 reps;Theraband    Theraband Level (Shoulder Row) Level 1 (Yellow)      Manual Therapy   Manual Therapy Soft tissue mobilization;Neural Stretch    Manual therapy comments STM to L pec minor and scalene stertches to all 3 heads, 30s hold, added inferior L clavicle glides, 3x10, STM to L scalene group.            Manual therapy included tactile assessment of first rib position, L shoulder PROM, scalene mobility and pec minor tightness          PT Short Term Goals - 05/08/21 1039       PT SHORT TERM GOAL #1   Title Pt will demonstrate ability to perform initial HEP consistently 3-4x/week    Baseline no HEP to date; 05/08/21 HEP issued    Time 6    Period Weeks    Status New    Target Date 05/23/21      PT SHORT TERM GOAL #2   Title Pt will demonstrate 5 lb increase in L grip strength    Baseline 25.7lb    Time 6    Period Weeks  Status New    Target Date 05/23/21      PT SHORT TERM GOAL #3   Title Pt will participate in assessment of her posture and mechanics while typing to decrease pressure on carpal tunnel while at work and school    Baseline has not been assessed to date    Time 6    Period Weeks    Status New    Target Date 05/23/21      PT SHORT TERM GOAL #4   Title Pt will report 25% reduction in numbness/pain symptoms experienced each day    Baseline LUE severe numbness, constant    Time 6    Period Weeks    Status New    Target Date 05/23/21               PT Long Term Goals - 04/08/21 1544       PT LONG TERM GOAL #1   Title Pt will be independent with final HEP and will consistently perform 3-4x/week    Baseline no HEP established    Time 12    Period Weeks    Status New    Target Date 07/07/21      PT LONG TERM GOAL #2   Title Pt will demonstrate 10 lb improvement in L grip strength to = R grip  strength and allow pt to lift heavy items when cooking and washing dishes    Baseline 25 lb; RUE: 35 lb    Time 12    Period Weeks    Status New    Target Date 07/07/21      PT LONG TERM GOAL #3   Title Pt will report 50% reduction in pain and numbness experienced each day to allow her to perform job and school work    Baseline pain/numbness 100% of the day    Time 12    Period Weeks    Status New    Target Date 07/07/21      PT LONG TERM GOAL #4   Title Pt will demonstrate improved sitting posture and computer set up for work/school to decrease repetitive pressure on bilat carpal tunnel    Baseline has not been assessed to date    Time 12    Period Weeks    Status New    Target Date 07/07/21                   Plan - 05/21/21 1657     Clinical Impression Statement Todays session continued to focus on L shoulder posture and scalene/pec minor tightness, symptoms remain elusive but objectively she presents with L scalene group tightness, an elvated L rib and L pec minor restrictions.  Treatment time spent explaining traetment rationale and S&S of TOS vs carpal tunnel syndrome.  R sidebending restrictions noted due to scalene tightness.  L shoulder sitting in better alignment.    Personal Factors and Comorbidities Finances;Time since onset of injury/illness/exacerbation;Fitness;Other;Profession;Comorbidity 3+    Comorbidities GERD, heliobacter pylori gastritis, bilat UE neuropathy, prediabetes, headaches, dizziness, OSA    Examination-Activity Limitations Carry;Lift    Examination-Participation Restrictions Cleaning;Meal Prep;School    Stability/Clinical Decision Making Evolving/Moderate complexity    Rehab Potential Fair    PT Frequency 1x / week    PT Duration 12 weeks    PT Treatment/Interventions ADLs/Self Care Home Management;Cryotherapy;Electrical Stimulation;Iontophoresis 4mg /ml Dexamethasone;Moist Heat;Ultrasound;Therapeutic activities;Therapeutic  exercise;Neuromuscular re-education;Patient/family education;Manual techniques;Passive range of motion;Dry needling;Taping;Joint Manipulations    PT Next Visit Plan Follow up on  HEP, review stretches, soft tissue work to L neck and anterior shoulder girdle to improve muscle imbalance restrictions    PT Home Exercise Plan [email or web address]    Consulted and Agree with Plan of Care Patient             Patient will benefit from skilled therapeutic intervention in order to improve the following deficits and impairments:  Decreased strength, Hypomobility, Increased fascial restricitons, Impaired sensation, Impaired UE functional use, Postural dysfunction, Pain  Visit Diagnosis: Other disturbances of skin sensation  Muscle weakness (generalized)  Abnormal posture     Problem List Patient Active Problem List   Diagnosis Date Noted   Contraceptive management 12/26/2020   COVID-19 12/11/2020   Right wrist tendinitis 06/06/2020   Viral URI 02/01/2020   Class 3 severe obesity due to excess calories without serious comorbidity with body mass index (BMI) of 45.0 to 49.9 in adult (HCC) 01/22/2020   Snoring 01/22/2020   Chronic fatigue 01/22/2020   Excessive daytime sleepiness 01/22/2020   OSA (obstructive sleep apnea) 01/22/2020   Cellulitis of breast 01/16/2020   Oropharyngeal dysphagia 01/11/2020   Sore throat 11/13/2019   Folliculitis 08/29/2019   Prediabetes 08/29/2019   Dizziness 08/23/2019   Memory difficulties 07/01/2019   Chronic tension type headache 05/29/2019   Acanthosis nigricans 05/29/2019   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   Hidradenitis suppurativa 03/04/2019   Carpal tunnel syndrome of left wrist 01/04/2019   Blurry vision, bilateral 12/16/2018   Learning difficulty 12/16/2018   Left wrist tendinitis 12/01/2018   Sleep deprivation 09/28/2018   Recurrent boils 09/21/2018   Irregular menstrual bleeding 04/03/2018   Acne vulgaris 04/03/2018   Acid reflux  01/21/2016   Allergic rhinitis 09/23/2015   Chronic constipation 07/24/2015    Hildred Laser 05/21/2021, 5:15 PM  [facility] [address] [place], [place], [address] Phone: [phone]   Fax:  [phone]  Name: Lauren Obrien MRN: [id number] Date of Birth: July 10, 2001

## 2021-05-22 ENCOUNTER — Ambulatory Visit: Payer: Medicaid Other

## 2021-05-29 ENCOUNTER — Ambulatory Visit: Payer: Medicaid Other

## 2021-05-29 ENCOUNTER — Other Ambulatory Visit: Payer: Self-pay

## 2021-05-29 DIAGNOSIS — R208 Other disturbances of skin sensation: Secondary | ICD-10-CM

## 2021-05-29 DIAGNOSIS — M6281 Muscle weakness (generalized): Secondary | ICD-10-CM

## 2021-05-29 DIAGNOSIS — R293 Abnormal posture: Secondary | ICD-10-CM

## 2021-05-29 NOTE — Patient Instructions (Signed)
Access Code: [email or web address] URL: [email or web address] Date: 05/29/2021 Prepared by: Gustavus Bryant  Exercises Doorway Pec Stretch at 90 Degrees Abduction - 2 x daily - 7 x weekly - 1 sets - 1 reps - 30s hold Doorway Pec Stretch at 60 Degrees Abduction with Arm Straight - 2 x daily - 7 x weekly - 1 sets - 1 reps - 30s hold Doorway Pec Stretch at 120 Degrees Abduction - 2 x daily - 7 x weekly - 1 sets - 1 reps - 30s hold Seated Scapular Retraction - 2 x daily - 7 x weekly - 2 sets - 10 reps Seated Cervical Sidebending Stretch - 2 x daily - 7 x weekly - 1 sets - 3 reps - 30s hold

## 2021-05-29 NOTE — Therapy (Signed)
[facility] [address] [place], [place], [address] Phone: [phone]   Fax:  [phone]  Physical Therapy Treatment  Patient Details  Name: Lauren Obrien MRN: [id number] Date of Birth: 2001-05-02 Referring Provider (PT): Glendale Chard, DO   Encounter Date: 05/29/2021   PT End of Session - 05/29/21 1451     Visit Number 5    Number of Visits 13    Date for PT Re-Evaluation 07/07/21    Authorization Type [place] [employer] - have requested 12 visits    Authorization - Number of Visits 12    Progress Note Due on Visit 10    PT Start Time 1445    PT Stop Time 1530    PT Time Calculation (min) 45 min    Activity Tolerance Patient tolerated treatment well    Behavior During Therapy [facility] for tasks assessed/performed             Past Medical History:  Diagnosis Date   Acne vulgaris    GERD (gastroesophageal reflux disease)    Helicobacter pylori gastritis    Hidradenitis suppurativa    Neuropathy    BUE   Pharyngitis 11/13/2019    History reviewed. No pertinent surgical history.  There were no vitals filed for this visit.   Subjective Assessment - 05/29/21 1449     Subjective Has obtained a lumbar support but has yet to try try it out.  Feels symptoms may be less intense and frequent    Pertinent History GERD, heliobacter pylori gastritis, bilat UE neuropathy, prediabetes, headaches, dizziness, OSA    Limitations House hold activities;Lifting    Diagnostic tests Had MRI and nerve conduction study 2 years ago - normal.  Is nervous about having another nn conduction study done    Patient Stated Goals To determine where the numbness is coming from                               [facility] Adult PT Treatment/Exercise - 05/29/21 0001       Self-Care   Self-Care ADL's;Other Self-Care Comments    ADL's reviewed posture in sitting    Other Self-Care Comments  continued to  educate patient on posture and suspected TOS as well as treatment rationale      Manual Therapy   Manual Therapy Soft tissue mobilization;Neural Stretch    Manual therapy comments STM to L pec minor and scalene stertches to all 3 heads, 30s hold, added inferior L clavicle glides, 3x10, STM to L scalene group.                      PT Short Term Goals - 05/29/21 1753       PT SHORT TERM GOAL #1   Title Pt will demonstrate ability to perform initial HEP consistently 3-4x/week    Baseline no HEP to date; 05/08/21 HEP issued    Time 6    Period Weeks    Status New    Target Date 05/23/21      PT SHORT TERM GOAL #2   Title Pt will demonstrate 5 lb increase in L grip strength    Baseline 25.7lb; 05/28/21 L grip sterngth 22# at position 2    Time 6    Period Weeks    Status New    Target Date 05/23/21      PT SHORT TERM GOAL #3  Title Pt will participate in assessment of her posture and mechanics while typing to decrease pressure on carpal tunnel while at work and school    Baseline has not been assessed to date; 05/28/21 L shoulder posture normalized    Time 6    Period Weeks    Status New    Target Date 05/23/21      PT SHORT TERM GOAL #4   Title Pt will report 25% reduction in numbness/pain symptoms experienced each day    Baseline LUE severe numbness, constant    Time 6    Period Weeks    Status New    Target Date 05/23/21               PT Long Term Goals - 04/08/21 1544       PT LONG TERM GOAL #1   Title Pt will be independent with final HEP and will consistently perform 3-4x/week    Baseline no HEP established    Time 12    Period Weeks    Status New    Target Date 07/07/21      PT LONG TERM GOAL #2   Title Pt will demonstrate 10 lb improvement in L grip strength to = R grip strength and allow pt to lift heavy items when cooking and washing dishes    Baseline 25 lb; RUE: 35 lb    Time 12    Period Weeks    Status New    Target Date 07/07/21       PT LONG TERM GOAL #3   Title Pt will report 50% reduction in pain and numbness experienced each day to allow her to perform job and school work    Baseline pain/numbness 100% of the day    Time 12    Period Weeks    Status New    Target Date 07/07/21      PT LONG TERM GOAL #4   Title Pt will demonstrate improved sitting posture and computer set up for work/school to decrease repetitive pressure on bilat carpal tunnel    Baseline has not been assessed to date    Time 12    Period Weeks    Status New    Target Date 07/07/21                   Plan - 05/29/21 1745     Clinical Impression Statement Todays session focused on L 1st rib mobilization, manual scalene stertching and STM to L pec minor and L scalene group.  R SB remains limited in cervical spine due to soft tissue restriction.  Rotation, flexion and extension are all WNL and painfree.  L grip sterngth remains unchanges and symptoms of TOS persist in LUE.  L shoulder posture has improved and now sits more posterior    Personal Factors and Comorbidities Finances;Time since onset of injury/illness/exacerbation;Fitness;Other;Profession;Comorbidity 3+    Comorbidities GERD, heliobacter pylori gastritis, bilat UE neuropathy, prediabetes, headaches, dizziness, OSA    Examination-Activity Limitations Carry;Lift    Examination-Participation Restrictions Cleaning;Meal Prep;School    Stability/Clinical Decision Making Evolving/Moderate complexity    Rehab Potential Fair    PT Frequency 1x / week    PT Duration 12 weeks    PT Treatment/Interventions ADLs/Self Care Home Management;Cryotherapy;Electrical Stimulation;Iontophoresis 4mg /ml Dexamethasone;Moist Heat;Ultrasound;Therapeutic activities;Therapeutic exercise;Neuromuscular re-education;Patient/family education;Manual techniques;Passive range of motion;Dry needling;Taping;Joint Manipulations    PT Next Visit Plan Follow up on HEP, review stretches, soft tissue work to L neck  and anterior shoulder  girdle to improve muscle imbalance restrictions, first rib mobs on L    PT Home Exercise Plan [id number]    Consulted and Agree with Plan of Care Patient             Patient will benefit from skilled therapeutic intervention in order to improve the following deficits and impairments:  Decreased strength, Hypomobility, Increased fascial restricitons, Impaired sensation, Impaired UE functional use, Postural dysfunction, Pain  Visit Diagnosis: Other disturbances of skin sensation  Muscle weakness (generalized)  Abnormal posture     Problem List Patient Active Problem List   Diagnosis Date Noted   Contraceptive management 12/26/2020   COVID-19 12/11/2020   Right wrist tendinitis 06/06/2020   Viral URI 02/01/2020   Class 3 severe obesity due to excess calories without serious comorbidity with body mass index (BMI) of 45.0 to 49.9 in adult (HCC) 01/22/2020   Snoring 01/22/2020   Chronic fatigue 01/22/2020   Excessive daytime sleepiness 01/22/2020   OSA (obstructive sleep apnea) 01/22/2020   Cellulitis of breast 01/16/2020   Oropharyngeal dysphagia 01/11/2020   Sore throat 11/13/2019   Folliculitis 08/29/2019   Prediabetes 08/29/2019   Dizziness 08/23/2019   Memory difficulties 07/01/2019   Chronic tension type headache 05/29/2019   Acanthosis nigricans 05/29/2019   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   Hidradenitis suppurativa 03/04/2019   Carpal tunnel syndrome of left wrist 01/04/2019   Blurry vision, bilateral 12/16/2018   Learning difficulty 12/16/2018   Left wrist tendinitis 12/01/2018   Sleep deprivation 09/28/2018   Recurrent boils 09/21/2018   Irregular menstrual bleeding 04/03/2018   Acne vulgaris 04/03/2018   Acid reflux 01/21/2016   Allergic rhinitis 09/23/2015   Chronic constipation 07/24/2015    Hildred Laser 05/29/2021, 6:11 PM  [facility] [address] [place], [place], [address] Phone: [phone]   Fax:  [phone]  Name: Zoye Chandra MRN: [id number] Date of Birth: [birth year]

## 2021-06-05 ENCOUNTER — Ambulatory Visit: Payer: Medicaid Other

## 2021-06-11 DIAGNOSIS — G4733 Obstructive sleep apnea (adult) (pediatric): Secondary | ICD-10-CM | POA: Diagnosis not present

## 2021-06-15 ENCOUNTER — Ambulatory Visit: Payer: Medicaid Other | Admitting: Neurology

## 2021-06-16 ENCOUNTER — Telehealth: Payer: Self-pay | Admitting: Neurology

## 2021-06-16 NOTE — Telephone Encounter (Signed)
We don't have anything to do with insurance [employer] related to CPAP. I have forwarded this information to the DME company who assists with this. Patient also has a upcoming apt this month and if updated notes are needed they will get that at that time.

## 2021-06-16 NOTE — Telephone Encounter (Signed)
Pt called stating her ins company is needing a letter stating why she still needs the CPAP machine. Please advise.

## 2021-06-22 ENCOUNTER — Other Ambulatory Visit: Payer: Self-pay

## 2021-06-22 ENCOUNTER — Ambulatory Visit: Payer: Medicaid Other | Attending: Neurology

## 2021-06-22 DIAGNOSIS — M6281 Muscle weakness (generalized): Secondary | ICD-10-CM | POA: Diagnosis present

## 2021-06-22 DIAGNOSIS — R293 Abnormal posture: Secondary | ICD-10-CM | POA: Insufficient documentation

## 2021-06-22 DIAGNOSIS — R208 Other disturbances of skin sensation: Secondary | ICD-10-CM | POA: Insufficient documentation

## 2021-06-22 NOTE — Therapy (Signed)
[facility] [address] [place], [place], [address] Phone: [phone]   Fax:  [phone]  Physical Therapy Treatment  Patient Details  Name: Lauren Obrien MRN: [id number] Date of Birth: 20-Jun-2001 Referring Provider (PT): Glendale Chard, DO   Encounter Date: 06/22/2021   PT End of Session - 06/22/21 1615     Visit Number 6    Number of Visits 13    Date for PT Re-Evaluation 07/07/21    Authorization Type [place] Medicaid Healthy Blue - have requested 12 visits    Authorization - Number of Visits 12    Progress Note Due on Visit 10    PT Start Time 1615    PT Stop Time 1700    PT Time Calculation (min) 45 min    Activity Tolerance Patient tolerated treatment well    Behavior During Therapy [facility] for tasks assessed/performed             Past Medical History:  Diagnosis Date   Acne vulgaris    GERD (gastroesophageal reflux disease)    Helicobacter pylori gastritis    Hidradenitis suppurativa    Neuropathy    BUE   Pharyngitis 11/13/2019    History reviewed. No pertinent surgical history.  There were no vitals filed for this visit.   Subjective Assessment - 06/22/21 1620     Subjective reports symptoms less frequent and she goes times w/o feeling symptoms, wrist brace helps slightly    Pertinent History GERD, heliobacter pylori gastritis, bilat UE neuropathy, prediabetes, headaches, dizziness, OSA    Limitations House hold activities;Lifting    Diagnostic tests Had MRI and nerve conduction study 2 years ago - normal.  Is nervous about having another nn conduction study done    Patient Stated Goals To determine where the numbness is coming from                                         PT Short Term Goals - 06/22/21 1726       PT SHORT TERM GOAL #1   Title Pt will demonstrate ability to perform initial HEP consistently 3-4x/week    Baseline no HEP to date; 05/08/21 HEP  issued; 06/22/21 Semi compliant with HEP    Time 6    Period Weeks    Status On-going    Target Date 05/23/21      PT SHORT TERM GOAL #2   Title Pt will demonstrate 5 lb increase in L grip strength    Baseline 25.7lb; 05/28/21 L grip sterngth 22# at position 2; 06/22/21 No significant change from earlier testing    Time 6    Period Weeks    Status On-going    Target Date 05/23/21      PT SHORT TERM GOAL #3   Title Pt will participate in assessment of her posture and mechanics while typing to decrease pressure on carpal tunnel while at work and school    Baseline has not been assessed to date; 05/28/21 L shoulder posture normalized    Time 6    Period Weeks    Status Achieved    Target Date 05/23/21      PT SHORT TERM GOAL #4   Title Pt will report 25% reduction in numbness/pain symptoms experienced each day    Baseline LUE severe numbness, constant; 06/22/21 reports 25% improvement in symptoms  Time 6    Period Weeks    Status Achieved    Target Date 05/23/21               PT Long Term Goals - 04/08/21 1544       PT LONG TERM GOAL #1   Title Pt will be independent with final HEP and will consistently perform 3-4x/week    Baseline no HEP established    Time 12    Period Weeks    Status New    Target Date 07/07/21      PT LONG TERM GOAL #2   Title Pt will demonstrate 10 lb improvement in L grip strength to = R grip strength and allow pt to lift heavy items when cooking and washing dishes    Baseline 25 lb; RUE: 35 lb    Time 12    Period Weeks    Status New    Target Date 07/07/21      PT LONG TERM GOAL #3   Title Pt will report 50% reduction in pain and numbness experienced each day to allow her to perform job and school work    Baseline pain/numbness 100% of the day    Time 12    Period Weeks    Status New    Target Date 07/07/21      PT LONG TERM GOAL #4   Title Pt will demonstrate improved sitting posture and computer set up for work/school to decrease  repetitive pressure on bilat carpal tunnel    Baseline has not been assessed to date    Time 12    Period Weeks    Status New    Target Date 07/07/21                   Plan - 06/22/21 1721     Clinical Impression Statement Improvement reported in symptoms, less frequent and intense.  Focus of todays session was soft tissue work and stertching to L scalene group as well as L first rib inferior mobilizations and muscle energy techniques to improve first rib alignment.  R cervical SB 40d, L 30d increased to 35d following manual techniques.  No pec minor tenderness to L    Personal Factors and Comorbidities Finances;Time since onset of injury/illness/exacerbation;Fitness;Other;Profession;Comorbidity 3+    Comorbidities GERD, heliobacter pylori gastritis, bilat UE neuropathy, prediabetes, headaches, dizziness, OSA    Examination-Activity Limitations Carry;Lift    Examination-Participation Restrictions Cleaning;Meal Prep;School    Stability/Clinical Decision Making Evolving/Moderate complexity    Rehab Potential Fair    PT Frequency 1x / week    PT Duration 12 weeks    PT Treatment/Interventions ADLs/Self Care Home Management;Cryotherapy;Electrical Stimulation;Iontophoresis 4mg /ml Dexamethasone;Moist Heat;Ultrasound;Therapeutic activities;Therapeutic exercise;Neuromuscular re-education;Patient/family education;Manual techniques;Passive range of motion;Dry needling;Taping;Joint Manipulations    PT Next Visit Plan L scalene stertching, postureal control, L first rib mobs and soft tissu work    PT Home Exercise Plan [phone]    Consulted and Agree with Plan of Care Patient             Patient will benefit from skilled therapeutic intervention in order to improve the following deficits and impairments:  Decreased strength, Hypomobility, Increased fascial restricitons, Impaired sensation, Impaired UE functional use, Postural dysfunction, Pain  Visit Diagnosis: Other disturbances of  skin sensation  Muscle weakness (generalized)  Abnormal posture     Problem List Patient Active Problem List   Diagnosis Date Noted   Contraceptive management 12/26/2020   COVID-19 12/11/2020   Right  wrist tendinitis 06/06/2020   Viral URI 02/01/2020   Class 3 severe obesity due to excess calories without serious comorbidity with body mass index (BMI) of 45.0 to 49.9 in adult (HCC) 01/22/2020   Snoring 01/22/2020   Chronic fatigue 01/22/2020   Excessive daytime sleepiness 01/22/2020   OSA (obstructive sleep apnea) 01/22/2020   Cellulitis of breast 01/16/2020   Oropharyngeal dysphagia 01/11/2020   Sore throat 11/13/2019   Folliculitis 08/29/2019   Prediabetes 08/29/2019   Dizziness 08/23/2019   Memory difficulties 07/01/2019   Chronic tension type headache 05/29/2019   Acanthosis nigricans 05/29/2019   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   Hidradenitis suppurativa 03/04/2019   Carpal tunnel syndrome of left wrist 01/04/2019   Blurry vision, bilateral 12/16/2018   Learning difficulty 12/16/2018   Left wrist tendinitis 12/01/2018   Sleep deprivation 09/28/2018   Recurrent boils 09/21/2018   Irregular menstrual bleeding 04/03/2018   Acne vulgaris 04/03/2018   Acid reflux 01/21/2016   Allergic rhinitis 09/23/2015   Chronic constipation 07/24/2015    Hildred Laser PT 06/22/2021, 5:29 PM  [facility] [address] [place], [place], [address] Phone: [phone]   Fax:  [phone]  Name: Lauren Obrien MRN: [id number] Date of Birth: 03/08/2001

## 2021-06-24 DIAGNOSIS — G4733 Obstructive sleep apnea (adult) (pediatric): Secondary | ICD-10-CM | POA: Diagnosis not present

## 2021-06-29 ENCOUNTER — Ambulatory Visit: Payer: Medicaid Other | Admitting: Neurology

## 2021-07-01 ENCOUNTER — Other Ambulatory Visit: Payer: Self-pay

## 2021-07-01 ENCOUNTER — Ambulatory Visit (INDEPENDENT_AMBULATORY_CARE_PROVIDER_SITE_OTHER): Payer: Medicaid Other | Admitting: Family Medicine

## 2021-07-01 ENCOUNTER — Telehealth: Payer: Self-pay

## 2021-07-01 VITALS — BP 110/60 | HR 78 | Ht 66.0 in | Wt 300.4 lb

## 2021-07-01 DIAGNOSIS — R7303 Prediabetes: Secondary | ICD-10-CM | POA: Diagnosis not present

## 2021-07-01 DIAGNOSIS — R42 Dizziness and giddiness: Secondary | ICD-10-CM

## 2021-07-01 LAB — POCT GLYCOSYLATED HEMOGLOBIN (HGB A1C): Hemoglobin A1C: 5.6 % (ref 4.0–5.6)

## 2021-07-01 MED ORDER — MECLIZINE HCL 12.5 MG PO TABS: 12.5000 mg | ORAL_TABLET | Freq: Three times a day (TID) | ORAL | 0 refills | Status: DC | PRN

## 2021-07-01 NOTE — Progress Notes (Addendum)
    SUBJECTIVE:   CHIEF COMPLAINT / HPI:   Lauren Obrien is a [place] female who presents for dizziness when waking up this morning. States when she moves positions, leans forward gets dizzy. Denies any syncopal episodes. States she has been drinking a lot of water, about 3-4 bottles of water, and today about a half a bottle. Endorses occasionally feeling nauseaus like she wants to throw up. She state she wants to check her blood sugars. Endorses eating a lot of sweets lately. Did have a lot of sugar the night prior. She was recently prescribed Meclizine which she states she has not used much.    OBJECTIVE:   BP 110/60   Pulse 78   Ht 5\' 6"  (1.676 m)   Wt (!) 300 lb 6 oz (136.2 kg)   SpO2 99%   BMI 48.48 kg/m    Physical exam  General: well appearing, NAD Cardiovascular: RRR, no murmurs Lungs: CTAB. Normal WOB Abdomen: soft, non-distended Skin: warm, dry. No edema  ASSESSMENT/PLAN:   No problem-specific Assessment & Plan notes found for this encounter.   Dizziness Patient endorses dizziness when waking up this morning, and states it occurs more when going from seated to standing position. No dizziness when just moving head. Orthostatics negative though she has a history of orthostatic hypotension. Considered blood glucose contributing so obtained A1c. Can consider anemia though no known history. Less likely vertigo as she does not endorse feeling like the room is spinning. Likely related to dehydration, and stressed importance of staying hydrated and cutting down on sweets. Also advised to try the Meclizine to see if that helps. Gave strict return precautions.   If symptoms persist advised patient to return and can consider checking hgb and doing further work up.   , DO [facility]

## 2021-07-01 NOTE — Telephone Encounter (Signed)
Patient calls nurse line requesting appointment for dizziness. Patient has history of dizziness/ vertigo in which she was being prescribed meclizine. Patient states that meclizine has not been working and that dizziness has worsened and is sometimes accompanied by nausea. Denies cough, fever, sore throat, body aches.   Scheduled patient same day appointment in [facility] this AM. ED precautions given in the meantime.   Veronda Prude, RN

## 2021-07-01 NOTE — Patient Instructions (Addendum)
It was great seeing you today!  Today you came in for dizziness which is likely due to your orthostatic hypotension. I am prescribing the Meclizine to use as needed for your dizziness.   I recommend drinking plenty of water - about half your body weight in ounces so that would be about 150 ounces of water a day. We talked about occasionally incorporating flavors like crystal lite to help add variety. Also decreasing yoru sugar intake will be helpful as well   We are also checking your hgb A1c   Please return if you have episodes of passing out, or if symptoms worsen despite trying the things we discussed.   Visit Reminders: - Stop by the pharmacy to pick up your prescriptions  - Continue to work on your healthy eating habits and incorporating exercise into your daily life.   Feel free to call with any questions or concerns at any time, at [phone].   Take care,  Dr. Cora Collum [facility]

## 2021-07-02 ENCOUNTER — Encounter: Payer: Self-pay | Admitting: Family Medicine

## 2021-07-06 ENCOUNTER — Telehealth: Payer: Self-pay

## 2021-07-06 NOTE — Telephone Encounter (Signed)
Patient calls nurse line regarding continued dizziness. Patient reports that the meclizine helped "somewhat", however made her tired. Patient states that she also has been drinking plenty of water.   Scheduled patient follow up with PCP on 9/2. She is requesting additional recommendations from PCP.   Please advise.   Veronda Prude, RN

## 2021-07-07 NOTE — Telephone Encounter (Signed)
Late note entry: Called patient on 8/22 and had at length discussion with the patient regarding her dizziness.  Reports that she gets dizzy and short of breath when exerting herself like running to class or to catch the bus.  She reports drinking 3-16 ounce bottles of water a day.  Reports that the meclizine makes her feel worse.  We discussed that if the meclizine makes things worse then she should not take it and I need to do a physical exam and see her in person so we will follow-up at her next clinic visit.

## 2021-07-10 ENCOUNTER — Other Ambulatory Visit: Payer: Self-pay

## 2021-07-10 ENCOUNTER — Encounter: Payer: Self-pay | Admitting: Neurology

## 2021-07-10 ENCOUNTER — Ambulatory Visit (INDEPENDENT_AMBULATORY_CARE_PROVIDER_SITE_OTHER): Payer: Medicaid Other | Admitting: Neurology

## 2021-07-10 ENCOUNTER — Ambulatory Visit: Payer: Medicaid Other

## 2021-07-10 VITALS — BP 115/80 | HR 75 | Ht 66.0 in | Wt 301.0 lb

## 2021-07-10 DIAGNOSIS — R42 Dizziness and giddiness: Secondary | ICD-10-CM

## 2021-07-10 DIAGNOSIS — R200 Anesthesia of skin: Secondary | ICD-10-CM

## 2021-07-10 NOTE — Progress Notes (Signed)
Follow-up Visit   Date: 07/10/21   Lauren Obrien MRN: [id number] DOB: 2001/01/21   Interim History: Lauren Obrien is a 20 y.o. right-handed female with GERD returning to the clinic for follow-up of left arm numbness.  The patient was accompanied to the clinic by self.   History of present illness: Starting in 2020, she began having left wrist pain.  She has treated it with NSAIDs, which helped some.  She then began having numbness/tingling in the left hand and arm.  It is worse when she types or washes dishes. Nothing improves her symptoms.  Over the years, symptoms have become worse and she now starts to have similar problems in the right arm.  She tried wearing a wrist brace, but it made her arm feel even weaker.  She also complains of weakness in the left arm.  She works as a Scientist, physiological. She lives with parents, brother, and 18-year old son.    She has been evaluated for symptoms in 2020 by Dr. Marjory Lies at [facility] with NCS/EMG of the left arm, MRI brain, and MRI cervical spine which has been normal. She has not done PT.   UPDATE 07/10/2021:  At her last visit, I reviewed her prior work-up which was normal and suggested that she try physical therapy.  She has been doing 6 weeks of physical therapy and feels that that the numbness is less intense, but occurs continues to occur daily.  She continues to have right hand stiffness, especially after starting school.  Sometimes, when she is sitting for longer periods of time at her desk her arm will go numb.  She complains of spinning and lightheadedness which has been going on for sometime.    Medications:  Current Outpatient Medications on File Prior to Visit  Medication Sig Dispense Refill   cetirizine (ZYRTEC) 10 MG tablet Take 1 tablet (10 mg total) by mouth daily as needed for allergies. 30 tablet 11   clindamycin (CLINDAGEL) 1 % gel Apply topically 2 (two) times daily. 30 g 0   fluticasone (FLONASE) 50 MCG/ACT nasal spray Place  2 sprays into both nostrils as needed. 16 g 2   ibuprofen (ADVIL) 400 MG tablet Take 1 tablet (400 mg total) by mouth every 8 (eight) hours as needed. 90 tablet 3   levonorgestrel (MIRENA) 20 MCG/24HR IUD 1 each by Intrauterine route once.     meclizine (ANTIVERT) 12.5 MG tablet Take 1 tablet (12.5 mg total) by mouth 3 (three) times daily as needed for dizziness. 30 tablet 0   omeprazole (PRILOSEC) 40 MG capsule Take 1 capsule (40 mg total) by mouth daily. 30 capsule 11   polyethylene glycol powder (GLYCOLAX/MIRALAX) 17 GM/SCOOP powder Take one capful once daily. 255 g 5   No current facility-administered medications on file prior to visit.    Allergies: No Known Allergies  Vital Signs:  BP 115/80   Pulse 75   Ht 5\' 6"  (1.676 m)   Wt (!) 301 lb (136.5 kg)   SpO2 98%   BMI 48.58 kg/m    Neurological Exam: MENTAL STATUS including orientation to time, place, person, recent and remote memory, attention span and concentration, language, and fund of knowledge is normal.  Speech is not dysarthric.  CRANIAL NERVES:  No visual field defects.  Pupils equal round and reactive to light.  Normal conjugate, extra-ocular eye movements in all directions of gaze.  No ptosis.  Face is symmetric.   MOTOR:  Motor strength is 5/5 in  all extremities.  No atrophy, fasciculations or abnormal movements.  No pronator drift.  Tone is normal.    MSRs:  Reflexes are 2+/4 throughout.  SENSORY:  Intact to vibration throughout.  COORDINATION/GAIT:  Normal finger-to- nose-finger.  Intact rapid alternating movements bilaterally.  Gait narrow based and stable.   Data: MRI brain wwo contrast 09/01/2019: This MRI of the brain with and without contrast shows the following: 1.   There are multiple T2/flair hyperintense foci in the subcortical white matter.  None of these appear to be acute and there do not appear to be any new foci compared to the 2017 MRI.  This is a nonspecific finding and could represent sequela  of migraine, chronic microvascular ischemic change or prior infection or trauma.  There are no foci in the periventricular white matter or the infratentorial white matter and demyelination is less likely. 2.   There was a normal enhancement pattern and no acute findings.   MRI cervical spine wwo contrast 09/01/2019:  Normal   NCS/EMG of the left arm 10/04/2019:  Normal   IMPRESSION/PLAN: Left > right arm paresthesias, negative EMG, MRI brain and cervical spine.  Fortunately, she is having some improving with physical therapy  - Encouraged compliance of home exercises  - If symptoms get worse, repeat NCS/EMG of the arms  2.  Dizziness  - I do not appreciate any exam findings to suggest this is neurological  Return to clinic as needed  Thank you for allowing me to participate in patient's care.  If I can answer any additional questions, I would be pleased to do so.    Sincerely,    Leonna Schlee K. Allena Katz, DO

## 2021-07-10 NOTE — Patient Instructions (Addendum)
Continue home exercises  Call if your symptoms get worse and we can order nerve testing

## 2021-07-17 ENCOUNTER — Ambulatory Visit: Payer: Medicaid Other

## 2021-07-17 ENCOUNTER — Encounter: Payer: Self-pay | Admitting: Family Medicine

## 2021-07-17 ENCOUNTER — Ambulatory Visit (INDEPENDENT_AMBULATORY_CARE_PROVIDER_SITE_OTHER): Payer: Medicaid Other | Admitting: Family Medicine

## 2021-07-17 ENCOUNTER — Other Ambulatory Visit: Payer: Self-pay

## 2021-07-17 DIAGNOSIS — R42 Dizziness and giddiness: Secondary | ICD-10-CM

## 2021-07-17 NOTE — Patient Instructions (Signed)
It was wonderful seeing you today!  I am glad you are doing so well and congratulations on everything.  I want you to keep up the good work when it comes to drinking water instead of sodas.  I do recommend you try to exercise at least 30 minutes a few days a week.  Had be sure you are drinking plenty of water and work on setting a bedtime so that you get at least 6-7 hours of sleep at night.  If anything or have any questions see me a MyChart message.  I have a wonderful afternoon!

## 2021-07-17 NOTE — Progress Notes (Addendum)
    SUBJECTIVE:   CHIEF COMPLAINT / HPI:   Dizziness Patient presents the clinic today regarding her dizzy spells.  She reports she gets extremely dizzy when she is running late for class VII having to run to class.  She reports shortness of breath, and dizziness.  She notices the dizziness definitely occurs more often when she is exerting herself.  She also notices dizziness when she goes from sitting to standing occasionally.  We discussed her fluid intake and she reports she is drinking plenty of fluids but will work on staying better hydrated.  She has switched from sodas to drinking only water and has been losing weight.  Since she started doing this she has noticed that the dizziness has decreased but just not resolved.  We discussed her breathing pattern when she was doing strenuous activities and she reports she does notice she does not breathe well when she is doing this and since focusing on her breathing she is not getting his dizziness.  OBJECTIVE:   BP 110/74   Pulse 71   Ht 5\' 6"  (1.676 m)   Wt 298 lb 6.4 oz (135.4 kg)   SpO2 98%   BMI 48.16 kg/m   General: Well-appearing 20 year old female, no acute distress Cardiac: Regular rate and rhythm, no murmurs appreciated Respiratory: Normal work of breathing, lungs clear to auscultation bilaterally Abdomen: Soft, nontender, bowel sounds normal neuro: No gross abnormalities MSK: No gross abnormalities, able to ambulate without difficulty, able to stand up and walk around the room without dizziness  ASSESSMENT/PLAN:   Dizziness Patient continues to have intermittent episodes of dizziness.  May be postural hypotension.  Most likely multifactorial including hydration status as well as breathing changes.  Discussed ensuring adequate hydration and breathing exercises.  Strict ED patient is agreeable to this plan.     35, MD [facility]

## 2021-07-19 NOTE — Assessment & Plan Note (Signed)
Patient continues to have intermittent episodes of dizziness.  May be postural hypotension.  Most likely multifactorial including hydration status as well as breathing changes.  Discussed ensuring adequate hydration and breathing exercises.  Strict ED patient is agreeable to this plan.

## 2021-07-24 ENCOUNTER — Ambulatory Visit: Payer: Medicaid Other | Attending: Neurology

## 2021-07-24 ENCOUNTER — Other Ambulatory Visit: Payer: Self-pay

## 2021-07-24 DIAGNOSIS — R293 Abnormal posture: Secondary | ICD-10-CM | POA: Insufficient documentation

## 2021-07-24 DIAGNOSIS — M6281 Muscle weakness (generalized): Secondary | ICD-10-CM | POA: Diagnosis present

## 2021-07-24 DIAGNOSIS — R208 Other disturbances of skin sensation: Secondary | ICD-10-CM | POA: Diagnosis not present

## 2021-07-24 NOTE — Patient Instructions (Signed)
Access Code: [email or web address] URL: [email or web address] Date: 07/24/2021 Prepared by: Gustavus Bryant  Exercises Doorway Pec Stretch at 90 Degrees Abduction - 2 x daily - 7 x weekly - 1 sets - 1 reps - 30s hold Doorway Pec Stretch at 60 Degrees Abduction with Arm Straight - 2 x daily - 7 x weekly - 1 sets - 1 reps - 30s hold Doorway Pec Stretch at 120 Degrees Abduction - 2 x daily - 7 x weekly - 1 sets - 1 reps - 30s hold Seated Scapular Retraction - 2 x daily - 7 x weekly - 2 sets - 10 reps Seated Cervical Sidebending Stretch - 2 x daily - 7 x weekly - 1 sets - 3 reps - 30s hold Shoulder External Rotation and Scapular Retraction with Resistance - 2 x daily - 7 x weekly - 3 sets - 10 reps

## 2021-07-25 NOTE — Therapy (Addendum)
[facility] [address] [place], [place], [address] Phone: [phone]   Fax:  [phone]  Physical Therapy Treatment  Patient Details  Name: Lauren Obrien MRN: [id number] Date of Birth: 2001/03/30 Referring Provider (PT): Glendale Chard, DO   Encounter Date: 07/24/2021   PT End of Session - 07/24/21 1544     Visit Number 7    Number of Visits 13    Date for PT Re-Evaluation 07/07/21    Authorization Type [place] [employer] - have requested 12 visits    Authorization - Number of Visits 12    Progress Note Due on Visit 10    PT Start Time 1535    PT Stop Time 1615    PT Time Calculation (min) 40 min    Activity Tolerance Patient tolerated treatment well    Behavior During Therapy [facility] for tasks assessed/performed             Past Medical History:  Diagnosis Date   Acne vulgaris    GERD (gastroesophageal reflux disease)    Helicobacter pylori gastritis    Hidradenitis suppurativa    Neuropathy    BUE   Pharyngitis 11/13/2019    History reviewed. No pertinent surgical history.  There were no vitals filed for this visit.   Subjective Assessment - 07/24/21 1541     Subjective Now experiencing pain in R wrist and has been wearing an apple watch whic has created dorsal wrist pain.    Pertinent History GERD, heliobacter pylori gastritis, bilat UE neuropathy, prediabetes, headaches, dizziness, OSA    Limitations House hold activities;Lifting    Diagnostic tests Had MRI and nerve conduction study 2 years ago - normal.  Is nervous about having another nn conduction study done    Patient Stated Goals To determine where the numbness is coming from               07/27/21 0001  Posture/Postural Control  Posture Comments reviewed postural corrction techniques  Strength  Right Hand Grip (lbs) 37  Left Hand Grip (lbs) 32  Palpation  Palpation comment elevated L 1st rib     07/27/21 0001   Posture/Postural Control  Posture Comments reviewed postural corrction techniques  Self-Care  Self-Care ADL's;Posture  ADL's reviewed posture in sitting  Other Self-Care Comments  continued to educate patient on posture and suspected TOS as well as treatment rationale as well as need for consistency in appointments to achieve a successful outcome  Shoulder Exercises: Supine  Flexion AROM;Left;20 reps;Limitations  Flexion Limitations tennis ball placed under L levator for 2x10 reps to promote scapular mechanics  Shoulder Exercises: Seated  Retraction Strengthening;Both;10 reps;Theraband;Limitations  Theraband Level (Shoulder Retraction) Level 1 (Yellow)  Retraction Limitations 10 reps at 0d abd  Elevation AROM;Strengthening;Left;20 reps;Limitations  Elevation Limitations instructed to place tennis ball on 1 rib and elevate arm 3x10  Manual Therapy  Manual Therapy Scapular mobilization;Other (comment)  Other Manual Therapy manual stretch to all 3 scalene heads. 30s hold                              PT Short Term Goals - 06/22/21 1726       PT SHORT TERM GOAL #1   Title Pt will demonstrate ability to perform initial HEP consistently 3-4x/week    Baseline no HEP to date; 05/08/21 HEP issued; 06/22/21 Semi compliant with HEP    Time 6  Period Weeks    Status On-going    Target Date 05/23/21      PT SHORT TERM GOAL #2   Title Pt will demonstrate 5 lb increase in L grip strength    Baseline 25.7lb; 05/28/21 L grip sterngth 22# at position 2; 06/22/21 No significant change from earlier testing    Time 6    Period Weeks    Status On-going    Target Date 05/23/21      PT SHORT TERM GOAL #3   Title Pt will participate in assessment of her posture and mechanics while typing to decrease pressure on carpal tunnel while at work and school    Baseline has not been assessed to date; 05/28/21 L shoulder posture normalized    Time 6    Period Weeks    Status Achieved     Target Date 05/23/21      PT SHORT TERM GOAL #4   Title Pt will report 25% reduction in numbness/pain symptoms experienced each day    Baseline LUE severe numbness, constant; 06/22/21 reports 25% improvement in symptoms    Time 6    Period Weeks    Status Achieved    Target Date 05/23/21               PT Long Term Goals - 04/08/21 1544       PT LONG TERM GOAL #1   Title Pt will be independent with final HEP and will consistently perform 3-4x/week    Baseline no HEP established    Time 12    Period Weeks    Status New    Target Date 07/07/21      PT LONG TERM GOAL #2   Title Pt will demonstrate 10 lb improvement in L grip strength to = R grip strength and allow pt to lift heavy items when cooking and washing dishes    Baseline 25 lb; RUE: 35 lb    Time 12    Period Weeks    Status New    Target Date 07/07/21      PT LONG TERM GOAL #3   Title Pt will report 50% reduction in pain and numbness experienced each day to allow her to perform job and school work    Baseline pain/numbness 100% of the day    Time 12    Period Weeks    Status New    Target Date 07/07/21      PT LONG TERM GOAL #4   Title Pt will demonstrate improved sitting posture and computer set up for work/school to decrease repetitive pressure on bilat carpal tunnel    Baseline has not been assessed to date    Time 12    Period Weeks    Status New    Target Date 07/07/21                    Patient will benefit from skilled therapeutic intervention in order to improve the following deficits and impairments:     Visit Diagnosis: Other disturbances of skin sensation  Muscle weakness (generalized)  Abnormal posture     Problem List Patient Active Problem List   Diagnosis Date Noted   Contraceptive management 12/26/2020   COVID-19 12/11/2020   Right wrist tendinitis 06/06/2020   Viral URI 02/01/2020   Class 3 severe obesity due to excess calories without serious comorbidity with  body mass index (BMI) of 45.0 to 49.9 in adult (HCC) 01/22/2020   Snoring  01/22/2020   Chronic fatigue 01/22/2020   Excessive daytime sleepiness 01/22/2020   OSA (obstructive sleep apnea) 01/22/2020   Cellulitis of breast 01/16/2020   Oropharyngeal dysphagia 01/11/2020   Sore throat 11/13/2019   Folliculitis 08/29/2019   Prediabetes 08/29/2019   Dizziness 08/23/2019   Memory difficulties 07/01/2019   Chronic tension type headache 05/29/2019   Acanthosis nigricans 05/29/2019   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   Hidradenitis suppurativa 03/04/2019   Carpal tunnel syndrome of left wrist 01/04/2019   Blurry vision, bilateral 12/16/2018   Learning difficulty 12/16/2018   Left wrist tendinitis 12/01/2018   Sleep deprivation 09/28/2018   Recurrent boils 09/21/2018   Irregular menstrual bleeding 04/03/2018   Acne vulgaris 04/03/2018   Acid reflux 01/21/2016   Allergic rhinitis 09/23/2015   Chronic constipation 07/24/2015    07/27/21 1443  Plan  Clinical Impression Statement Todays session consited of a review of progress towards goals and effectiveness of treatment to date.  Patient not fully compliant with HEP but does participate as she remembers.  Overall symptoms have improved but not resolved.  Symptoms remain elusive to distinctly reproduce but TOS is suspected underly dyfunction.  Continued S&S of elevated first rib L and was instructed in self mobilization techniques.  Personal Factors and Comorbidities Finances;Time since onset of injury/illness/exacerbation;Fitness;Other;Profession;Comorbidity 3+  Comorbidities GERD, heliobacter pylori gastritis, bilat UE neuropathy, prediabetes, headaches, dizziness, OSA  Examination-Activity Limitations Carry;Lift  Examination-Participation Restrictions Cleaning;Meal Prep;School  Pt will benefit from skilled therapeutic intervention in order to improve on the following deficits Decreased strength;Hypomobility;Increased fascial  restricitons;Impaired sensation;Impaired UE functional use;Postural dysfunction;Pain  Stability/Clinical Decision Making Evolving/Moderate complexity  Rehab Potential Fair  PT Frequency 1x / week  PT Duration 12 weeks  PT Treatment/Interventions ADLs/Self Care Home Management;Cryotherapy;Electrical Stimulation;Iontophoresis 4mg /ml Dexamethasone;Moist Heat;Ultrasound;Therapeutic activities;Therapeutic exercise;Neuromuscular re-education;Patient/family education;Manual techniques;Passive range of motion;Dry needling;Taping;Joint Manipulations  PT Next Visit Plan L scalene stertching, postureal control, L first rib mobs and soft tissue work  PT Home Exercise Plan  Consulted and Agree with Plan of Care Patient    [id number], PT 07/25/2021, 10:08 PM  [facility] [address] [place], [place], [place] Phone: [phone]   Fax:  [phone]  Name: Lauren Obrien MRN: [name] Date of Birth: June 26, 2001

## 2021-07-29 ENCOUNTER — Ambulatory Visit: Payer: Medicaid Other

## 2021-07-29 ENCOUNTER — Ambulatory Visit (INDEPENDENT_AMBULATORY_CARE_PROVIDER_SITE_OTHER): Payer: Medicaid Other | Admitting: Family Medicine

## 2021-07-29 VITALS — BP 118/67 | HR 88 | Temp 98.5°F | Ht 66.0 in

## 2021-07-29 DIAGNOSIS — J069 Acute upper respiratory infection, unspecified: Secondary | ICD-10-CM

## 2021-07-29 DIAGNOSIS — J029 Acute pharyngitis, unspecified: Secondary | ICD-10-CM

## 2021-07-29 MED ORDER — FLUTICASONE PROPIONATE 50 MCG/ACT NA SUSP: 2.0000 | NASAL | 2 refills | Status: DC | PRN

## 2021-07-29 NOTE — Patient Instructions (Signed)
It was great seeing Lauren Obrien today. You have a COVID test pending and be sure to quarantine until test is negative or longer if positive. Lauren Obrien likely has a common respiratory virus if your test is negative.  Symptoms typically peak at 2-3 days of illness and then gradually improve over 10-14 days. However, a cough may last 2-4 weeks.   Recommend:  - May also suck on a hard candy or lozenge while awake.  - Fore sore throat: Try warm salt water gargles 2-3 times a day. Can also try warm camomile or peppermint tea as well cold substances like popsicles. Motrin/Ibuprofen and over the counter-chloraseptic spray can provide relief. - Humidifier in room at as needed / at bedtime  - Suction nose esp. before bed and/or use saline spray throughout the day to help clear secretions.  - Increase fluid intake as it is important for your child to stay hydrated.  - Remember cough from viral illness can last weeks   If you have questions or concerns please do not hesitate to call at [phone].  Dr. Rachael Darby

## 2021-07-29 NOTE — Progress Notes (Signed)
         SUBJECTIVE:   CHIEF COMPLAINT / HPI:   Chief Complaint  Patient presents with   Sore Throat   Headache     Lauren Obrien is a 20 y.o. female here for sore throat. Started having a itchy sore throat on Sunday. More sneezing than usual.Typically has sore throat when the seasons changes. Has productive (non-bloody) cough. Feels like her "eyes are really heavy" similar to when she has been sick in the past. Endorses headache with lightheadedness when she walks uphill. Normally gets shortness of breath  and lightheaded when she walks uphill. Has been more stressed with school. Has been sleeping more as she feels more tired since school started. Has had three episodes of loose stools/diarrhea today. Has a son at home with similar sx. Has congestion and rhinorrhea. Taking Ricola throat lozenges with some relief.  Denies new shortness of breath, chest pain, fever, myalgias, arthralgias, changes to taste or smell, vomiting,        PERTINENT  PMH / PSH: has seasonal allergies, reviewed and updated as appropriate   OBJECTIVE:   BP 118/67   Pulse 88   Temp 98.5 F (36.9 C) (Oral)   Ht 5\' 6"  (1.676 m)   SpO2 99%   BMI 48.16 kg/m     GEN:     alert, well hydrated, well developed female and no distress    HENT:  mucus membranes moist, oropharyngeal without lesions, tonsils 2+, mild erythema , turbinate hypertrophy, clear nasal discharge, bilateral TM normal EYES:   pupils equal and reactive, EOM intact NECK:  supple, normal ROM, no lymphadenopathy  RESP:  clear to auscultation bilaterally, no increased work of breathing  CVS:   regular rate and rhythm ABD:  soft, mild epigastric tenderness; bowel sounds present; no palpable masses Skin:   warm and dry, no rash on visible skin, normal skin turgor    ASSESSMENT/PLAN:     Viral respiratory illness  History consistent with viral illness. Overall pt is well appearing, well hydrated, without respiratory distress.  Discussed symptomatic treatment. COVID testing obtained. Pt to quarantine until COVID test results or longer if positive.   Explained lack of efficacy of antibiotics in viral disease. Call if shortness of breath worsens, blood in sputum, change in character of cough, development of fever or chills, inability to maintain nutrition and hydration. Avoid exposure to tobacco smoke and fumes. - continue Tylenol/ Motrin as needed for discomfort - nasal saline to help with his nasal congestion - Use a cool mist humidifier at bedtime to help with breathing - Stressed hydration - Flonase refilled  - Work/school note provided   - Discussed ED/return precautions, understanding voiced  , DO PGY-3, [facility] 07/29/2021

## 2021-07-30 LAB — SARS-COV-2, NAA 2 DAY TAT

## 2021-07-30 LAB — NOVEL CORONAVIRUS, NAA: SARS-CoV-2, NAA: NOT DETECTED

## 2021-07-31 ENCOUNTER — Ambulatory Visit: Payer: Medicaid Other | Admitting: Neurology

## 2021-08-07 ENCOUNTER — Ambulatory Visit: Payer: Medicaid Other

## 2021-08-07 ENCOUNTER — Other Ambulatory Visit: Payer: Self-pay

## 2021-08-07 DIAGNOSIS — R208 Other disturbances of skin sensation: Secondary | ICD-10-CM

## 2021-08-07 DIAGNOSIS — R293 Abnormal posture: Secondary | ICD-10-CM

## 2021-08-07 DIAGNOSIS — M6281 Muscle weakness (generalized): Secondary | ICD-10-CM

## 2021-08-07 NOTE — Therapy (Addendum)
[facility] [address] [place], [place], [address] Phone: [phone]   Fax:  [phone]    Patient Details  Name: Lauren Obrien MRN: [id number] Date of Birth: 02-01-2001 Referring Provider (PT): Alda Berthold, DO   Encounter Date: 08/07/2021  PHYSICAL THERAPY DISCHARGE SUMMARY  Visits from Start of Care: 8  Current functional level related to goals / functional outcomes: Goals met   Remaining deficits: Occasional numbness B wrists   Education / Equipment: HEP   Patient agrees to discharge. Patient goals were met. Patient is being discharged due to being pleased with the current functional level.      Past Medical History:  Diagnosis Date   Acne vulgaris    GERD (gastroesophageal reflux disease)    Helicobacter pylori gastritis    Hidradenitis suppurativa    Neuropathy    BUE   Pharyngitis 11/13/2019    No past surgical history on file.  There were no vitals filed for this visit.    Todays session spent assessing progress and determining progress towards goals and self management. Patient has met all goals and symptoms have localized to occasional numbness in B hands, cervical ROM is painfree and WNL all planes, no first rib tenderness, grip strengths are equal B, 32# R, 31#L and Allen test on L wrist is now negative.  Patient is ready for I management.  If symptoms persist, recommend OT consult.                             PT Short Term Goals - 08/07/21 1627       PT SHORT TERM GOAL #1   Title Pt will demonstrate ability to perform initial HEP consistently 3-4x/week    Baseline no HEP to date; 05/08/21 HEP issued; 06/22/21 Semi compliant with HEP; 07/24/21 No change in compliance    Time 6    Period Weeks    Status Achieved    Target Date 05/23/21      PT SHORT TERM GOAL #2   Title Pt will demonstrate 5 lb increase in L grip strength    Baseline 25.7lb; 05/28/21  L grip sterngth 22# at position 2; 06/22/21 No significant change from earlier testing; 07/24/21 l grip strength 27 at #2    Time 6    Period Weeks    Status Achieved    Target Date 05/23/21      PT SHORT TERM GOAL #3   Title Pt will participate in assessment of her posture and mechanics while typing to decrease pressure on carpal tunnel while at work and school    Baseline has not been assessed to date; 05/28/21 L shoulder posture normalized    Time 6    Period Weeks    Status Achieved    Target Date 05/23/21      PT SHORT TERM GOAL #4   Title Pt will report 25% reduction in numbness/pain symptoms experienced each day    Baseline LUE severe numbness, constant; 06/22/21 reports 25% improvement in symptoms    Time 6    Period Weeks    Status Achieved    Target Date 05/23/21               PT Long Term Goals - 08/07/21 1550       PT LONG TERM GOAL #1   Title Pt will be independent with final HEP and will consistently perform 3-4x/week  Baseline no HEP established; 08/07/21 Reviewed final HEP and able to demo back to PT    Time 12    Period Weeks    Status Achieved      PT LONG TERM GOAL #2   Title Pt will demonstrate 10 lb improvement in L grip strength to = R grip strength and allow pt to lift heavy items when cooking and washing dishes    Baseline 25 lb; RUE: 35 lb; 08/07/21 R 32#, L 31#    Time 12    Period Weeks    Status Achieved      PT LONG TERM GOAL #3   Title Pt will report 50% reduction in pain and numbness experienced each day to allow her to perform job and school work    Baseline pain/numbness 100% of the day; 08/07/21 Reports pain is now essentially resolved, numbness has decreased but she cannot quantify it.    Time 12    Period Weeks    Status Achieved      PT LONG TERM GOAL #4   Title Pt will demonstrate improved sitting posture and computer set up for work/school to decrease repetitive pressure on bilat carpal tunnel    Baseline has not been assessed to  date; 08/07/21 Postural alignment has improved.    Time 12    Period Weeks    Status Achieved                     Patient will benefit from skilled therapeutic intervention in order to improve the following deficits and impairments:  Decreased strength, Hypomobility, Increased fascial restricitons, Impaired sensation, Impaired UE functional use, Postural dysfunction, Pain  Visit Diagnosis: Other disturbances of skin sensation  Muscle weakness (generalized)  Abnormal posture     Problem List Patient Active Problem List   Diagnosis Date Noted   Contraceptive management 12/26/2020   COVID-19 12/11/2020   Right wrist tendinitis 06/06/2020   Viral URI 02/01/2020   Class 3 severe obesity due to excess calories without serious comorbidity with body mass index (BMI) of 45.0 to 49.9 in adult ([place]) 01/22/2020   Snoring 01/22/2020   Chronic fatigue 01/22/2020   Excessive daytime sleepiness 01/22/2020   OSA (obstructive sleep apnea) 01/22/2020   Cellulitis of breast 01/16/2020   Oropharyngeal dysphagia 01/11/2020   Sore throat 63/87/5643   Folliculitis 32/95/1884   Prediabetes 08/29/2019   Dizziness 08/23/2019   Memory difficulties 07/01/2019   Chronic tension type headache 05/29/2019   Acanthosis nigricans 05/29/2019   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   Hidradenitis suppurativa 03/04/2019   Carpal tunnel syndrome of left wrist 01/04/2019   Blurry vision, bilateral 12/16/2018   Learning difficulty 12/16/2018   Left wrist tendinitis 12/01/2018   Sleep deprivation 09/28/2018   Recurrent boils 09/21/2018   Irregular menstrual bleeding 04/03/2018   Acne vulgaris 04/03/2018   Acid reflux 01/21/2016   Allergic rhinitis 09/23/2015   Chronic constipation 07/24/2015    Lanice Shirts, PT 08/10/2021, 12:07 PM  [facility] [address] [place], [place], [address] Phone: [phone]   Fax:   [phone]  Name: Lauren Obrien MRN: [id number] Date of Birth: December 03, 2000

## 2021-08-10 NOTE — Addendum Note (Signed)
Addended by: Hildred Laser on: 08/10/2021 12:15 PM   Modules accepted: Orders

## 2021-08-14 ENCOUNTER — Ambulatory Visit: Payer: Medicaid Other

## 2021-08-17 ENCOUNTER — Encounter (HOSPITAL_BASED_OUTPATIENT_CLINIC_OR_DEPARTMENT_OTHER): Payer: Self-pay | Admitting: Oral Surgery

## 2021-08-17 ENCOUNTER — Other Ambulatory Visit: Payer: Self-pay

## 2021-08-19 NOTE — H&P (Signed)
  Patient: Lauren Obrien  PID: [id number]  DOB: [birth year]  SEX: Female   Patient referred by general dentist for extraction teeth 1, 16, 17, 32.  CC: Occasional pain in wisdom teeth.  Past Medical History:  Chest Pain or Angina, Sinus Issues, Sleep Apnea, Pre-Diabetes, Morbid Obesity    Medications: None, Vitamin B, Ibuprophen    Allergies:     NKDA    Surgeries:   None          Exam: BMI 49.  HEENT, PERRLA, EOMI,  Oral: Impacted teeth # 1, 16, 17, 32. No purulence, edema, fluctuance, trismus. Oral cancer screening negative. Pharynx clear.  Neck: No lymphadenopathy. Cor: RRR no murmurs Lungs: clear  Panorex:Impacted teeth # 1, 16, 17, 32.  Assessment: ASA 3.  Impacted teeth # 1, 16, 17, 32.           Plan: Extraction Teeth #  1, 16, 17, 32  Hospital day surgery.              Rx: none               Risks and complications explained. Questions answered.   Sco

## 2021-08-21 ENCOUNTER — Ambulatory Visit: Payer: Medicaid Other

## 2021-08-25 ENCOUNTER — Ambulatory Visit (HOSPITAL_BASED_OUTPATIENT_CLINIC_OR_DEPARTMENT_OTHER): Payer: Medicaid Other | Admitting: Anesthesiology

## 2021-08-25 ENCOUNTER — Ambulatory Visit (HOSPITAL_BASED_OUTPATIENT_CLINIC_OR_DEPARTMENT_OTHER)
Admission: RE | Admit: 2021-08-25 | Discharge: 2021-08-25 | Disposition: A | Discharge status: Home / Self Care | Payer: Medicaid Other | Attending: Oral Surgery | Admitting: Oral Surgery

## 2021-08-25 ENCOUNTER — Encounter (HOSPITAL_BASED_OUTPATIENT_CLINIC_OR_DEPARTMENT_OTHER): Payer: Self-pay | Admitting: Oral Surgery

## 2021-08-25 ENCOUNTER — Encounter (HOSPITAL_BASED_OUTPATIENT_CLINIC_OR_DEPARTMENT_OTHER)
Admission: RE | Disposition: A | Discharge status: Home / Self Care | Payer: Self-pay | Source: Home / Self Care | Attending: Oral Surgery

## 2021-08-25 ENCOUNTER — Other Ambulatory Visit: Payer: Self-pay

## 2021-08-25 DIAGNOSIS — Z6841 Body Mass Index (BMI) 40.0 and over, adult: Secondary | ICD-10-CM | POA: Insufficient documentation

## 2021-08-25 DIAGNOSIS — K029 Dental caries, unspecified: Secondary | ICD-10-CM | POA: Diagnosis not present

## 2021-08-25 DIAGNOSIS — K011 Impacted teeth: Secondary | ICD-10-CM | POA: Insufficient documentation

## 2021-08-25 DIAGNOSIS — G4733 Obstructive sleep apnea (adult) (pediatric): Secondary | ICD-10-CM | POA: Diagnosis not present

## 2021-08-25 DIAGNOSIS — J302 Other seasonal allergic rhinitis: Secondary | ICD-10-CM | POA: Diagnosis not present

## 2021-08-25 HISTORY — DX: Prediabetes: R73.03

## 2021-08-25 HISTORY — DX: Other seasonal allergic rhinitis: J30.2

## 2021-08-25 HISTORY — DX: Sleep apnea, unspecified: G47.30

## 2021-08-25 HISTORY — PX: TOOTH EXTRACTION: SHX859

## 2021-08-25 LAB — POCT PREGNANCY, URINE: Preg Test, Ur: NEGATIVE

## 2021-08-25 SURGERY — DENTAL RESTORATION/EXTRACTIONS
Anesthesia: General | Site: Mouth | Laterality: Bilateral

## 2021-08-25 MED ORDER — DEXAMETHASONE SODIUM PHOSPHATE 10 MG/ML IJ SOLN
INTRAMUSCULAR | Status: AC
  Filled 2021-08-25: qty 1

## 2021-08-25 MED ORDER — FENTANYL CITRATE (PF) 100 MCG/2ML IJ SOLN
INTRAMUSCULAR | Status: AC
  Filled 2021-08-25: qty 2

## 2021-08-25 MED ORDER — ONDANSETRON HCL 4 MG/2ML IJ SOLN
INTRAMUSCULAR | Status: AC
  Filled 2021-08-25: qty 2

## 2021-08-25 MED ORDER — DEXAMETHASONE SODIUM PHOSPHATE 4 MG/ML IJ SOLN
INTRAMUSCULAR | Status: DC | PRN
  Administered 2021-08-25: 10 mg via INTRAVENOUS

## 2021-08-25 MED ORDER — FENTANYL CITRATE (PF) 100 MCG/2ML IJ SOLN
INTRAMUSCULAR | Status: DC | PRN
  Administered 2021-08-25 (×4): 50 ug via INTRAVENOUS

## 2021-08-25 MED ORDER — CEFAZOLIN IN SODIUM CHLORIDE 3-0.9 GM/100ML-% IV SOLN
3.0000 g | INTRAVENOUS | Status: AC
  Administered 2021-08-25: 3 g via INTRAVENOUS

## 2021-08-25 MED ORDER — LIDOCAINE-EPINEPHRINE 2 %-1:100000 IJ SOLN
INTRAMUSCULAR | Status: DC | PRN
  Administered 2021-08-25: 10 mL via INTRADERMAL

## 2021-08-25 MED ORDER — ONDANSETRON 4 MG PO TBDP
4.0000 mg | ORAL_TABLET | Freq: Once | ORAL | Status: AC
  Administered 2021-08-25: 4 mg via ORAL

## 2021-08-25 MED ORDER — CEFAZOLIN IN SODIUM CHLORIDE 3-0.9 GM/100ML-% IV SOLN
INTRAVENOUS | Status: AC
  Filled 2021-08-25: qty 100

## 2021-08-25 MED ORDER — SODIUM CHLORIDE 0.9 % IR SOLN
Status: DC | PRN
  Administered 2021-08-25: 1

## 2021-08-25 MED ORDER — OXYCODONE HCL 5 MG PO TABS: 5.0000 mg | ORAL_TABLET | Freq: Once | ORAL | Status: DC | PRN

## 2021-08-25 MED ORDER — LACTATED RINGERS IV SOLN: INTRAVENOUS | Status: DC

## 2021-08-25 MED ORDER — AMOXICILLIN 500 MG PO CAPS
500.0000 mg | ORAL_CAPSULE | Freq: Two times a day (BID) | ORAL | 0 refills | Status: DC
Start: 2021-08-25 — End: 2021-11-17

## 2021-08-25 MED ORDER — ROCURONIUM BROMIDE 100 MG/10ML IV SOLN
INTRAVENOUS | Status: DC | PRN
  Administered 2021-08-25: 60 mg via INTRAVENOUS

## 2021-08-25 MED ORDER — OXYCODONE HCL 5 MG/5ML PO SOLN: 5.0000 mg | Freq: Once | ORAL | Status: DC | PRN

## 2021-08-25 MED ORDER — FENTANYL CITRATE (PF) 100 MCG/2ML IJ SOLN
25.0000 ug | INTRAMUSCULAR | Status: DC | PRN
  Administered 2021-08-25 (×2): 50 ug via INTRAVENOUS

## 2021-08-25 MED ORDER — ONDANSETRON HCL 4 MG/2ML IJ SOLN: 4.0000 mg | Freq: Once | INTRAMUSCULAR | Status: DC | PRN

## 2021-08-25 MED ORDER — HYDROCODONE-ACETAMINOPHEN 5-325 MG PO TABS: 1.0000 | ORAL_TABLET | ORAL | 0 refills | Status: DC | PRN

## 2021-08-25 MED ORDER — LIDOCAINE 2% (20 MG/ML) 5 ML SYRINGE
INTRAMUSCULAR | Status: AC
  Filled 2021-08-25: qty 5

## 2021-08-25 MED ORDER — ACETAMINOPHEN 160 MG/5ML PO SOLN: 325.0000 mg | ORAL | Status: DC | PRN

## 2021-08-25 MED ORDER — SUGAMMADEX SODIUM 200 MG/2ML IV SOLN
INTRAVENOUS | Status: DC | PRN
  Administered 2021-08-25: 200 mg via INTRAVENOUS

## 2021-08-25 MED ORDER — PROPOFOL 10 MG/ML IV BOLUS
INTRAVENOUS | Status: DC | PRN
  Administered 2021-08-25: 200 mg via INTRAVENOUS

## 2021-08-25 MED ORDER — MIDAZOLAM HCL 5 MG/5ML IJ SOLN
INTRAMUSCULAR | Status: DC | PRN
  Administered 2021-08-25: 2 mg via INTRAVENOUS

## 2021-08-25 MED ORDER — PROPOFOL 10 MG/ML IV BOLUS
INTRAVENOUS | Status: AC
  Filled 2021-08-25: qty 20

## 2021-08-25 MED ORDER — ONDANSETRON 4 MG PO TBDP
ORAL_TABLET | ORAL | Status: AC
  Filled 2021-08-25: qty 1

## 2021-08-25 MED ORDER — MEPERIDINE HCL 25 MG/ML IJ SOLN: 6.2500 mg | INTRAMUSCULAR | Status: DC | PRN

## 2021-08-25 MED ORDER — DEXMEDETOMIDINE (PRECEDEX) IN NS 20 MCG/5ML (4 MCG/ML) IV SYRINGE
PREFILLED_SYRINGE | INTRAVENOUS | Status: DC | PRN
  Administered 2021-08-25: 20 ug via INTRAVENOUS

## 2021-08-25 MED ORDER — ONDANSETRON HCL 4 MG/2ML IJ SOLN
INTRAMUSCULAR | Status: DC | PRN
  Administered 2021-08-25: 4 mg via INTRAVENOUS

## 2021-08-25 MED ORDER — MIDAZOLAM HCL 2 MG/2ML IJ SOLN
INTRAMUSCULAR | Status: AC
  Filled 2021-08-25: qty 2

## 2021-08-25 MED ORDER — LIDOCAINE HCL (CARDIAC) PF 100 MG/5ML IV SOSY
PREFILLED_SYRINGE | INTRAVENOUS | Status: DC | PRN
  Administered 2021-08-25: 80 mg via INTRAVENOUS

## 2021-08-25 MED ORDER — ACETAMINOPHEN 325 MG PO TABS: 325.0000 mg | ORAL_TABLET | ORAL | Status: DC | PRN

## 2021-08-25 SURGICAL SUPPLY — 31 items
BLADE SURG 15 STRL LF DISP TIS (BLADE) ×1 IMPLANT
BLADE SURG 15 STRL SS (BLADE) ×2
BNDG EYE OVAL (GAUZE/BANDAGES/DRESSINGS) ×2 IMPLANT
BUR CROSS CUT FISSURE 1.6 (BURR) ×2 IMPLANT
CANISTER SUCT 1200ML W/VALVE (MISCELLANEOUS) ×2 IMPLANT
COVER BACK TABLE 60X90IN (DRAPES) ×2 IMPLANT
COVER MAYO STAND STRL (DRAPES) ×2 IMPLANT
DRAPE U-SHAPE 76X120 STRL (DRAPES) ×2 IMPLANT
GAUZE PACKING FOLDED 2  STR (GAUZE/BANDAGES/DRESSINGS) ×1
GAUZE PACKING FOLDED 2 STR (GAUZE/BANDAGES/DRESSINGS) ×1 IMPLANT
GLOVE SURG ENC MOIS LTX SZ6.5 (GLOVE) ×2 IMPLANT
GLOVE SURG ENC MOIS LTX SZ8 (GLOVE) ×2 IMPLANT
GLOVE SURG UNDER POLY LF SZ6.5 (GLOVE) ×2 IMPLANT
GOWN STRL REUS W/ TWL LRG LVL3 (GOWN DISPOSABLE) ×1 IMPLANT
GOWN STRL REUS W/ TWL XL LVL3 (GOWN DISPOSABLE) ×1 IMPLANT
GOWN STRL REUS W/TWL LRG LVL3 (GOWN DISPOSABLE) ×2
GOWN STRL REUS W/TWL XL LVL3 (GOWN DISPOSABLE) ×2
IV NS 500ML (IV SOLUTION) ×2
IV NS 500ML BAXH (IV SOLUTION) ×1 IMPLANT
NEEDLE HYPO 22GX1.5 SAFETY (NEEDLE) ×2 IMPLANT
NS IRRIG 1000ML POUR BTL (IV SOLUTION) ×2 IMPLANT
PACK BASIN DAY SURGERY FS (CUSTOM PROCEDURE TRAY) ×2 IMPLANT
SLEEVE IRRIGATION ELITE 7 (MISCELLANEOUS) ×2 IMPLANT
SLEEVE SCD COMPRESS KNEE MED (STOCKING) ×1 IMPLANT
SUT CHROMIC 3 0 PS 2 (SUTURE) ×2 IMPLANT
SYR BULB EAR ULCER 3OZ GRN STR (SYRINGE) ×2 IMPLANT
SYR CONTROL 10ML LL (SYRINGE) ×2 IMPLANT
TOWEL GREEN STERILE FF (TOWEL DISPOSABLE) ×2 IMPLANT
TUBE CONNECTING 20X1/4 (TUBING) ×2 IMPLANT
TUBING IRRIGATION (MISCELLANEOUS) ×2 IMPLANT
YANKAUER SUCT BULB TIP NO VENT (SUCTIONS) ×2 IMPLANT

## 2021-08-25 NOTE — Anesthesia Procedure Notes (Signed)
Procedure Name: Intubation Date/Time: 08/25/2021 10:51 AM Performed by: Maryella Shivers, CRNA Pre-anesthesia Checklist: Patient identified, Emergency Drugs available, Suction available and Patient being monitored Patient Re-evaluated:Patient Re-evaluated prior to induction Oxygen Delivery Method: Circle system utilized Preoxygenation: Pre-oxygenation with 100% oxygen Induction Type: IV induction Ventilation: Mask ventilation without difficulty Laryngoscope Size: Mac and 3 Grade View: Grade I Nasal Tubes: Nasal prep performed, Nasal Rae, Right and Magill forceps- large, utilized Tube size: 6.5 mm Placement Confirmation: ETT inserted through vocal cords under direct vision, positive ETCO2 and breath sounds checked- equal and bilateral Secured at: 26 cm Tube secured with: Tape Dental Injury: Teeth and Oropharynx as per pre-operative assessment

## 2021-08-25 NOTE — Discharge Instructions (Signed)

## 2021-08-25 NOTE — Transfer of Care (Signed)
Immediate Anesthesia Transfer of Care Note  Patient: Lauren Obrien  Procedure(s) Performed: DENTAL RESTORATION/EXTRACTIONS (Bilateral: Mouth)  Patient Location: PACU  Anesthesia Type:General  Level of Consciousness: sedated  Airway & Oxygen Therapy: Patient Spontanous Breathing and Patient connected to face mask oxygen  Post-op Assessment: Report given to RN and Post -op Vital signs reviewed and stable  Post vital signs: Reviewed and stable  Last Vitals:  Vitals Value Taken Time  BP 111/73 08/25/21 1130  Temp 36.7 C 08/25/21 1130  Pulse 75 08/25/21 1131  Resp 20 08/25/21 1131  SpO2 99 % 08/25/21 1131  Vitals shown include unvalidated device data.  Last Pain:  Vitals:   08/25/21 0932  TempSrc: Oral  PainSc: 0-No pain      Patients Stated Pain Goal: 2 (08/25/21 0932)  Complications: No notable events documented.

## 2021-08-25 NOTE — Anesthesia Postprocedure Evaluation (Signed)
Anesthesia Post Note  Patient: Lauren Obrien  Procedure(s) Performed: DENTAL RESTORATION/EXTRACTIONS (Bilateral: Mouth)     Patient location during evaluation: PACU Anesthesia Type: General Level of consciousness: awake and alert Pain management: pain level controlled Vital Signs Assessment: post-procedure vital signs reviewed and stable Respiratory status: spontaneous breathing, nonlabored ventilation, respiratory function stable and patient connected to nasal cannula oxygen Cardiovascular status: blood pressure returned to baseline and stable Postop Assessment: no apparent nausea or vomiting Anesthetic complications: no   No notable events documented.  Last Vitals:  Vitals:   08/25/21 1130 08/25/21 1132  BP: 111/73 112/72  Pulse: 75 75  Resp: 10 20  Temp: 36.7 C   SpO2: 99% 99%    Last Pain:  Vitals:   08/25/21 1130  TempSrc:   PainSc: 0-No pain                 Hermina Barnard

## 2021-08-25 NOTE — H&P (Signed)
H&P documentation  -History and Physical Reviewed  -Patient has been re-examined  -No change in the plan of care  Lauren Obrien  

## 2021-08-25 NOTE — Anesthesia Preprocedure Evaluation (Signed)
Anesthesia Evaluation  Patient identified by MRN, date of birth, ID band Patient awake    Reviewed: Allergy & Precautions, H&P , NPO status , Patient's Chart, lab work & pertinent test results, reviewed documented beta blocker date and time   Airway Mallampati: II  TM Distance: >3 FB Neck ROM: full    Dental no notable dental hx.    Pulmonary sleep apnea ,    Pulmonary exam normal breath sounds clear to auscultation       Cardiovascular Exercise Tolerance: Good negative cardio ROS   Rhythm:regular Rate:Normal     Neuro/Psych negative neurological ROS  negative psych ROS   GI/Hepatic Neg liver ROS, GERD  Medicated,  Endo/Other  Morbid obesity  Renal/GU negative Renal ROS  negative genitourinary   Musculoskeletal   Abdominal   Peds  Hematology negative hematology ROS (+)   Anesthesia Other Findings   Reproductive/Obstetrics negative OB ROS                             Anesthesia Physical Anesthesia Plan  ASA: 2  Anesthesia Plan: General   Post-op Pain Management:    Induction: Intravenous  PONV Risk Score and Plan: 3 and Ondansetron, Dexamethasone and Treatment may vary due to age or medical condition  Airway Management Planned: Oral ETT and Nasal ETT  Additional Equipment:   Intra-op Plan:   Post-operative Plan: Extubation in OR  Informed Consent: I have reviewed the patients History and Physical, chart, labs and discussed the procedure including the risks, benefits and alternatives for the proposed anesthesia with the patient or authorized representative who has indicated his/her understanding and acceptance.     Dental Advisory Given  Plan Discussed with: CRNA  Anesthesia Plan Comments: (  )        Anesthesia Quick Evaluation

## 2021-08-25 NOTE — Op Note (Signed)
08/25/2021  11:17 AM  PATIENT:  Lauren Obrien  20 y.o. female  PRE-OPERATIVE DIAGNOSIS:  IMPACTED TEETH # 1, 16, 17, 32  POST-OPERATIVE DIAGNOSIS:  SAME  PROCEDURE:  Procedure(s): EXTRACTION TEETH # 1, 16, 17, 32  SURGEON:  Surgeon(s): Barbette Merino, Acupuncturist, DMD  ANESTHESIA:   local and general  EBL:  minimal  DRAINS: none   SPECIMEN:  No Specimen  COUNTS:  YES  PLAN OF CARE: Discharge to home after PACU  PATIENT DISPOSITION:  PACU - hemodynamically stable.   PROCEDURE DETAILS: Dictation # [id number]  Georgia Lopes, DMD 08/25/2021 11:17 AM

## 2021-08-25 NOTE — Op Note (Signed)
Lauren Obrien, Lauren Obrien MEDICAL RECORD NO: [id number] ACCOUNT NO: [id number] DATE OF BIRTH: Apr 15, 2001 FACILITY: [facility] LOCATION: [facility] PHYSICIAN: Georgia Lopes, DDS  Operative Report   DATE OF PROCEDURE: 08/25/2021  PREOPERATIVE DIAGNOSIS:  Impacted teeth 1, 16, 17, and 32.  POSTOPERATIVE DIAGNOSIS:  Impacted teeth 1, 16, 17, and 32.  PROCEDURE:  Extraction teeth numbers 1, 16, 17, and 32.  SURGEON:  Georgia Lopes, DDS  ANESTHESIA:  General, nasal intubation.  Dr. Tacy Dura attending.  INDICATIONS:  The patient is a 20 year old who has morbid obesity, BMI of 48.  She had impacted wisdom teeth, and because of the need for airway protection and risk of airway compromise, it was recommended that we do surgery in day surgery for airway protection.  00:43] the patient can be intubated for airway protection.  DESCRIPTION OF PROCEDURE:  The patient was taken to the operating room and placed on the table in supine position.  General anesthesia was administered.  A nasal endotracheal tube was placed and secured.  The eyes were protected, and the patient was  draped for surgery.  Timeout was performed.  The posterior pharynx was suctioned, and a throat pack was placed.  2% lidocaine 1:100,000 epinephrine was infiltrated in an inferior alveolar block on the right and left sides and in buccal and palatal  infiltration of the maxilla around the impacted wisdom teeth.  A total of 10 mL was utilized.  A bite block was placed in the right side of the mouth.  A sweetheart retractor was used to retract the tongue.  A #15 blade was used to make an incision  overlying teeth numbers 16 and 17.  The periosteum was reflected to expose the teeth.  Then, bone was removed in the mandible around tooth number 17.  The tooth was elevated and removed with dental forceps.  Tooth number 16 was elevated with a 301  elevator and then removed from the mouth.  The sockets were curetted, irrigated, and closed with 3-0  chromic.  The bite block was repositioned to the other side of the mouth.  The 15 blade was used to make an incision overlying teeth numbers 1 and 32.   The periosteum was reflected.  Bone was removed in the mandible using the [employer] handpiece under irrigation with a fissure bur, and then the tooth was removed with a 301 elevator.  Tooth number 1 was removed using a 301 elevator as well.  The sockets  were then curetted, irrigated, and closed with 3-0 chromic.  Then, the oral cavity was irrigated and suctioned.  The throat pack was removed.  The patient was left in the care of anesthesia for transport to recovery room with plans for discharge home  through day surgery.  ESTIMATED BLOOD LOSS:  Minimal.  COMPLICATIONS:  None.  SPECIMENS:  None.   [facility] D: 08/25/2021 11:20:18 am T: 08/25/2021 9:49:00 pm  JOB: [id number]/ [id number]

## 2021-08-28 ENCOUNTER — Encounter (HOSPITAL_BASED_OUTPATIENT_CLINIC_OR_DEPARTMENT_OTHER): Payer: Self-pay | Admitting: Oral Surgery

## 2021-08-28 ENCOUNTER — Ambulatory Visit: Payer: Medicaid Other

## 2021-09-07 DIAGNOSIS — G4733 Obstructive sleep apnea (adult) (pediatric): Secondary | ICD-10-CM | POA: Diagnosis not present

## 2021-09-15 ENCOUNTER — Ambulatory Visit (INDEPENDENT_AMBULATORY_CARE_PROVIDER_SITE_OTHER): Payer: Medicaid Other | Admitting: *Deleted

## 2021-09-15 ENCOUNTER — Other Ambulatory Visit: Payer: Self-pay

## 2021-09-15 DIAGNOSIS — Z23 Encounter for immunization: Secondary | ICD-10-CM | POA: Diagnosis present

## 2021-09-30 ENCOUNTER — Ambulatory Visit: Payer: Medicaid Other | Admitting: Neurology

## 2021-09-30 ENCOUNTER — Encounter: Payer: Self-pay | Admitting: Neurology

## 2021-10-29 ENCOUNTER — Telehealth: Payer: Self-pay | Admitting: Neurology

## 2021-10-29 NOTE — Telephone Encounter (Signed)
Noted  

## 2021-10-29 NOTE — Telephone Encounter (Signed)
Lauren Obrien- please call pt to let her know that since it has been over a year since last seen we cannot write any orders until she is seen.

## 2021-10-29 NOTE — Telephone Encounter (Signed)
Pt has called to report that re: her CPAP supplies Dr Vickey Huger has her down for supplies once a year.  Pt states there are certain items such as the nose air cushions that are to be replaced every 3 months, please call pt to discuss and resolve

## 2021-10-29 NOTE — Telephone Encounter (Signed)
Pt was called and she moved up her appointment to 12-22 FYI no call back requested

## 2021-11-04 NOTE — Progress Notes (Deleted)
PATIENT: Lauren Obrien DOB: October 05, 2001  REASON FOR VISIT: follow up HISTORY FROM: patient  No chief complaint on file.    HISTORY OF PRESENT ILLNESS:  11/04/21 ALL:  Lauren Obrien is a 20 y.o. female here today for follow up for OSA on CPAP.      HISTORY: (copied from Dr Dohmeier's previous note)  Lauren Obrien is a 20 y.o. year old Hispanic  female patient seen here as a referral on 06/25/2020 . She is a primary patient of Dr. Marjory Obrien.    [name] is here today to follow-up on her to sleep studies at the beginning of her CPAP therapy she was first diagnosed with obstructive sleep apnea at a mild degree at present strong REM sleep dependence on 01-09-2020 CPAP titration followed on 5-10 21 she was fitted with a ResMed nasal cushion mask and small size.  She did well between 5 and 9 cm water pressure and was given an auto titration machine with heated humidity with a setting from 5-12 with 1 cm EPR.  She remained on the same nasal cushion mask.  In the meantime she has developed trouble with the dryness of the air but also with the feeling that there is too much pressure sometimes leaking out of the mouth.  She has changed the mask recently.  She has a 97% compliance for days and 85% compliance 4 hours with an average of 6 hours and 12 minutes, the settings have remained between 5 and 12 cmH2O now with 3 cm EPR and her AHI is 4.1 which is a good resolution of apnea she does not have any central apneas arising her residual apneas still are obstructive in nature the air leakage is above average the 95th percentile pressure is 11.6.  There have been days with very high apnea indices and other days where there is almost none.  Air leak and higher apnea index seem to correlate.   She sometimes lost the mask at night . No longer feeling congested. Nasal drip continues. PCP has prescribed nasal spray Weight loss has not been addressed.   Needs a weight loss  program.  Has an IUD. Hirsutism, possible PCOS.    Initial Consultation: Chief concern according to patient : possible sleep disorder.  She  has a past medical history of Acne vulgaris, GERD (gastroesophageal reflux disease), Helicobacter pylori gastritis, Hidradenitis suppurativa, Neuropathy, and Pharyngitis (11/13/2019).  The patient is morbidly obese- BMI 47 kg/m2.  Her headaches started 4 years ago, daily migraines at the time. It improved for a while- and returned when under stress, when under test situations. She described the headaches as arising from the temple, pressure, retro-orbital.  Rarely sharp sensation. Sometimes nauseated- but not related to headaches.  No olfactory triggers, but photophobia, phonophobia.   Family medical /sleep history: brother has OSA, untreated.    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 currently works part timeArt gallery manager. Pets are not present. Tobacco use; never - but father is a smoker-.  ETOH use : none , Caffeine intake in form of Coffee( none) Soda( 3 a week) Tea (none  ) or energy drinks..     Sleep habits are as follows: The patient's dinner time is between 5 PM. The patient goes to bed at 11-12 PM and continues to sleep for 6-8 hours,  Is restless, wakes  rarely for1 bathroom break. She averages 6-7 hours of sleep at night.   The preferred sleep position is on her side, with the support of 1 pillow.  Dreams are reportedly present most night.   7.30-8 AM is the usual rise time. The patient wakes up 6.50 with an alarm.   She reports not feeling refreshed or restored in AM, with symptoms such as dry mouth, feeling tired, rarely morning headaches and residual fatigue.  Naps are taken infrequently.   REVIEW OF SYSTEMS: Out of a complete 14 system review of symptoms, the patient complains only of the following symptoms, and all other reviewed systems  are negative.  ESS:  ALLERGIES: No Known Allergies  HOME MEDICATIONS: Outpatient Medications Prior to Visit  Medication Sig Dispense Refill   amoxicillin (AMOXIL) 500 MG capsule Take 1 capsule (500 mg total) by mouth 2 (two) times daily. 14 capsule 0   cetirizine (ZYRTEC) 10 MG tablet Take 1 tablet (10 mg total) by mouth daily as needed for allergies. 30 tablet 11   fluticasone (FLONASE) 50 MCG/ACT nasal spray Place 2 sprays into both nostrils as needed. 16 g 2   HYDROcodone-acetaminophen (NORCO) 5-325 MG tablet Take 1 tablet by mouth every 4 (four) hours as needed for moderate pain. 12 tablet 0   ibuprofen (ADVIL) 400 MG tablet Take 1 tablet (400 mg total) by mouth every 8 (eight) hours as needed. 90 tablet 3   levonorgestrel (MIRENA) 20 MCG/24HR IUD 1 each by Intrauterine route once.     meclizine (ANTIVERT) 12.5 MG tablet Take 1 tablet (12.5 mg total) by mouth 3 (three) times daily as needed for dizziness. 30 tablet 0   omeprazole (PRILOSEC) 40 MG capsule Take 1 capsule (40 mg total) by mouth daily. 30 capsule 11   polyethylene glycol powder (GLYCOLAX/MIRALAX) 17 GM/SCOOP powder Take one capful once daily. 255 g 5   No facility-administered medications prior to visit.    PAST MEDICAL HISTORY: Past Medical History:  Diagnosis Date   Acne vulgaris    GERD (gastroesophageal reflux disease)    Helicobacter pylori gastritis    Hidradenitis suppurativa    Neuropathy    BUE   Pharyngitis 11/13/2019   Pre-diabetes    Seasonal allergies    Sleep apnea     PAST SURGICAL HISTORY: Past Surgical History:  Procedure Laterality Date   NO PAST SURGERIES     TOOTH EXTRACTION Bilateral 08/25/2021   Procedure: DENTAL RESTORATION/EXTRACTIONS;  Surgeon: Ocie Doyne, DMD;  Location: [facility];  Service: Oral Surgery;  Laterality: Bilateral;    FAMILY HISTORY: Family History  Problem Relation Age of Onset   Diabetes Father    Hypertension Father    Asthma Maternal  Grandmother    Diabetes Maternal Grandmother    Diabetes Maternal Grandfather    Diabetes Paternal Grandmother    Diabetes Paternal Grandfather    Colon cancer Neg Hx    Esophageal cancer Neg Hx    Pancreatic cancer Neg Hx    Stomach cancer Neg Hx    Liver disease Neg Hx     SOCIAL HISTORY: Social History   Socioeconomic History   Marital status: Single    Spouse name: Not on file   Number of children: 1   Years of education: 12   Highest education level: Not on file  Occupational History    Comment: [employer] student  Tobacco Use   Smoking status: Never   Smokeless tobacco: Never  Vaping Use   Vaping Use:  Never used  Substance and Sexual Activity   Alcohol use: No    Alcohol/week: 0.0 standard drinks   Drug use: No   Sexual activity: Not Currently  Other Topics Concern   Not on file  Social History Narrative   Right handed     11th grade does not do well in school   Social Determinants of Health   Financial Resource Strain: Not on file  Food Insecurity: Not on file  Transportation Needs: Not on file  Physical Activity: Not on file  Stress: Not on file  Social Connections: Not on file  Intimate Partner Violence: Not on file     PHYSICAL EXAM  There were no vitals filed for this visit. There is no height or weight on file to calculate BMI.  Generalized: Well developed, in no acute distress  Cardiology: normal rate and rhythm, no murmur noted Respiratory: clear to auscultation bilaterally  Neurological examination  Mentation: Alert oriented to time, place, history taking. Follows all commands speech and language fluent Cranial nerve II-XII: Pupils were equal round reactive to light. Extraocular movements were full, visual field were full  Motor: The motor testing reveals 5 over 5 strength of all 4 extremities. Good symmetric motor tone is noted throughout.  Gait and station: Gait is normal.    DIAGNOSTIC DATA (LABS, IMAGING, TESTING) - I reviewed patient  records, labs, notes, testing and imaging myself where available.  No flowsheet data found.   Lab Results  Component Value Date   WBC 9.1 06/26/2019   HGB 12.1 08/21/2019   HCT 38.1 06/26/2019   MCV 76 (L) 06/26/2019   PLT 287 06/26/2019      Component Value Date/Time   NA 139 06/26/2019 1544   K 4.5 06/26/2019 1544   CL 101 06/26/2019 1544   CO2 24 06/26/2019 1544   GLUCOSE 105 (H) 06/26/2019 1544   GLUCOSE 96 08/13/2015 2120   BUN 9 06/26/2019 1544   CREATININE 0.66 06/26/2019 1544   CALCIUM 9.6 06/26/2019 1544   PROT 6.6 06/26/2019 1544   ALBUMIN 4.2 06/26/2019 1544   AST 14 06/26/2019 1544   ALT 20 06/26/2019 1544   ALKPHOS 90 06/26/2019 1544   BILITOT 0.4 06/26/2019 1544   GFRNONAA 129 06/26/2019 1544   GFRAA 149 06/26/2019 1544   No results found for: CHOL, HDL, LDLCALC, LDLDIRECT, TRIG, CHOLHDL Lab Results  Component Value Date   HGBA1C 5.6 07/01/2021   Lab Results  Component Value Date   VITAMINB12 472 06/26/2019   Lab Results  Component Value Date   TSH 1.710 06/26/2019     ASSESSMENT AND PLAN 20 y.o. year old female  has a past medical history of Acne vulgaris, GERD (gastroesophageal reflux disease), Helicobacter pylori gastritis, Hidradenitis suppurativa, Neuropathy, Pharyngitis (11/13/2019), Pre-diabetes, Seasonal allergies, and Sleep apnea. here with   No diagnosis found.    Dewayne Shorter Obrien is doing well on CPAP therapy. Compliance report reveals ***. *** was encouraged to continue using CPAP nightly and for greater than 4 hours each night. We will update supply orders as indicated. Risks of untreated sleep apnea review and education materials provided. Healthy lifestyle habits encouraged. *** will follow up in ***, sooner if needed. *** verbalizes understanding and agreement with this plan.    No orders of the defined types were placed in this encounter.    No orders of the defined types were placed in this encounter.     Shawnie Dapper,  FNP-C 11/04/2021, 4:32 PM [facility], [place], [place] [address] [phone]

## 2021-11-05 ENCOUNTER — Ambulatory Visit: Payer: Medicaid Other | Admitting: Family Medicine

## 2021-11-05 ENCOUNTER — Encounter: Payer: Self-pay | Admitting: Family Medicine

## 2021-11-05 VITALS — BP 122/72 | HR 84 | Ht 66.0 in | Wt 307.0 lb

## 2021-11-05 DIAGNOSIS — G4733 Obstructive sleep apnea (adult) (pediatric): Secondary | ICD-10-CM | POA: Diagnosis not present

## 2021-11-05 DIAGNOSIS — Z9989 Dependence on other enabling machines and devices: Secondary | ICD-10-CM

## 2021-11-05 NOTE — Progress Notes (Signed)
PATIENT: Lauren Obrien DOB: 30-Jun-2001  REASON FOR VISIT: follow up HISTORY FROM: patient  Chief Complaint  Patient presents with   Obstructive Sleep Apnea    Rm 1, alone. Here for yearly CPAP f/u. Pt reports doing well. Pt reports has not gotten any supplies last 2 months. Pt reports feeling like she has been getting to much air. Pt has been using a different tubing and is not drying on it own like previous tubing did.      HISTORY OF PRESENT ILLNESS:  11/05/21 ALL:  Lauren Obrien is a 20 y.o. female here today for follow up for OSA on CPAP. She is doing fairly well. She continues CPAP. She admits that she has not ordered new supplies in several months. She is overdue to follow up and needed new rx for supplies. She usually wakes at some point in the night and removes the mask. She has seasonal allergies and noted occasional burning of her nares.     HISTORY: (copied from Dr Dohmeier's previous note)  Lauren Obrien is a 20 y.o. year old Hispanic  female patient seen here as a referral on 06/25/2020 . She is a primary patient of Dr. Marjory Lies.    Lauren Obrien is here today to follow-up on her to sleep studies at the beginning of her CPAP therapy she was first diagnosed with obstructive sleep apnea at a mild degree at present strong REM sleep dependence on 01-09-2020 CPAP titration followed on 5-10 21 she was fitted with a ResMed nasal cushion mask and small size.  She did well between 5 and 9 cm water pressure and was given an auto titration machine with heated humidity with a setting from 5-12 with 1 cm EPR.  She remained on the same nasal cushion mask.  In the meantime she has developed trouble with the dryness of the air but also with the feeling that there is too much pressure sometimes leaking out of the mouth.  She has changed the mask recently.  She has a 97% compliance for days and 85% compliance 4 hours with an average of 6 hours and 12 minutes, the  settings have remained between 5 and 12 cmH2O now with 3 cm EPR and her AHI is 4.1 which is a good resolution of apnea she does not have any central apneas arising her residual apneas still are obstructive in nature the air leakage is above average the 95th percentile pressure is 11.6.  There have been days with very high apnea indices and other days where there is almost none.  Air leak and higher apnea index seem to correlate.   She sometimes lost the mask at night . No longer feeling congested. Nasal drip continues. PCP has prescribed nasal spray Weight loss has not been addressed.   Needs a weight loss program.  Has an IUD. Hirsutism, possible PCOS.    Initial Consultation: Chief concern according to patient : possible sleep disorder.  She  has a past medical history of Acne vulgaris, GERD (gastroesophageal reflux disease), Helicobacter pylori gastritis, Hidradenitis suppurativa, Neuropathy, and Pharyngitis (11/13/2019).  The patient is morbidly obese- BMI 47 kg/m2.  Her headaches started 4 years ago, daily migraines at the time. It improved for a while- and returned when under stress, when under test situations. She described the headaches as arising from the temple, pressure, retro-orbital.  Rarely sharp sensation. Sometimes nauseated- but not related to headaches.  No olfactory triggers, but photophobia, phonophobia.  Family medical /sleep history: brother has OSA, untreated.    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 currently works part timeArt gallery manager. Pets are not present. Tobacco use; never - but father is a smoker-.  ETOH use : none , Caffeine intake in form of Coffee( none) Soda( 3 a week) Tea (none  ) or energy drinks..     Sleep habits are as follows: The patient's dinner time is between 5 PM. The patient goes to bed at 11-12 PM and continues to sleep for 6-8 hours,  Is  restless, wakes rarely for1 bathroom break. She averages 6-7 hours of sleep at night.   The preferred sleep position is on her side, with the support of 1 pillow.  Dreams are reportedly present most night.   7.30-8 AM is the usual rise time. The patient wakes up 6.50 with an alarm.   She reports not feeling refreshed or restored in AM, with symptoms such as dry mouth, feeling tired, rarely morning headaches and residual fatigue.  Naps are taken infrequently.   REVIEW OF SYSTEMS: Out of a complete 14 system review of symptoms, the patient complains only of the following symptoms, seasonal allergies and all other reviewed systems are negative.  ESS: 9  ALLERGIES: No Known Allergies  HOME MEDICATIONS: Outpatient Medications Prior to Visit  Medication Sig Dispense Refill   amoxicillin (AMOXIL) 500 MG capsule Take 1 capsule (500 mg total) by mouth 2 (two) times daily. 14 capsule 0   cetirizine (ZYRTEC) 10 MG tablet Take 1 tablet (10 mg total) by mouth daily as needed for allergies. 30 tablet 11   fluticasone (FLONASE) 50 MCG/ACT nasal spray Place 2 sprays into both nostrils as needed. 16 g 2   HYDROcodone-acetaminophen (NORCO) 5-325 MG tablet Take 1 tablet by mouth every 4 (four) hours as needed for moderate pain. 12 tablet 0   ibuprofen (ADVIL) 400 MG tablet Take 1 tablet (400 mg total) by mouth every 8 (eight) hours as needed. 90 tablet 3   levonorgestrel (MIRENA) 20 MCG/24HR IUD 1 each by Intrauterine route once.     meclizine (ANTIVERT) 12.5 MG tablet Take 1 tablet (12.5 mg total) by mouth 3 (three) times daily as needed for dizziness. 30 tablet 0   omeprazole (PRILOSEC) 40 MG capsule Take 1 capsule (40 mg total) by mouth daily. 30 capsule 11   polyethylene glycol powder (GLYCOLAX/MIRALAX) 17 GM/SCOOP powder Take one capful once daily. 255 g 5   No facility-administered medications prior to visit.    PAST MEDICAL HISTORY: Past Medical History:  Diagnosis Date   Acne vulgaris    GERD  (gastroesophageal reflux disease)    Helicobacter pylori gastritis    Hidradenitis suppurativa    Neuropathy    BUE   Pharyngitis 11/13/2019   Pre-diabetes    Seasonal allergies    Sleep apnea     PAST SURGICAL HISTORY: Past Surgical History:  Procedure Laterality Date   NO PAST SURGERIES     TOOTH EXTRACTION Bilateral 08/25/2021   Procedure: DENTAL RESTORATION/EXTRACTIONS;  Surgeon: Ocie Doyne, DMD;  Location: [facility];  Service: Oral Surgery;  Laterality: Bilateral;    FAMILY HISTORY: Family History  Problem Relation Age of Onset   Diabetes Father    Hypertension Father    Asthma Maternal Grandmother    Diabetes Maternal Grandmother    Diabetes Maternal Grandfather  Diabetes Paternal Grandmother    Diabetes Paternal Grandfather    Colon cancer Neg Hx    Esophageal cancer Neg Hx    Pancreatic cancer Neg Hx    Stomach cancer Neg Hx    Liver disease Neg Hx     SOCIAL HISTORY: Social History   Socioeconomic History   Marital status: Single    Spouse name: Not on file   Number of children: 1   Years of education: 12   Highest education level: Not on file  Occupational History    Comment: [employer] student  Tobacco Use   Smoking status: Never   Smokeless tobacco: Never  Vaping Use   Vaping Use: Never used  Substance and Sexual Activity   Alcohol use: No    Alcohol/week: 0.0 standard drinks   Drug use: No   Sexual activity: Not Currently  Other Topics Concern   Not on file  Social History Narrative   Right handed     11th grade does not do well in school   Social Determinants of Health   Financial Resource Strain: Not on file  Food Insecurity: Not on file  Transportation Needs: Not on file  Physical Activity: Not on file  Stress: Not on file  Social Connections: Not on file  Intimate Partner Violence: Not on file     PHYSICAL EXAM  Vitals:   11/05/21 0949  BP: 122/72  Pulse: 84  Weight: (!) 307 lb (139.3 kg)  Height: 5\' 6"   (1.676 m)   Body mass index is 49.55 kg/m.  Generalized: Well developed, in no acute distress  Cardiology: normal rate and rhythm, no murmur noted Respiratory: clear to auscultation bilaterally  Neurological examination  Mentation: Alert oriented to time, place, history taking. Follows all commands speech and language fluent Cranial nerve II-XII: Pupils were equal round reactive to light. Extraocular movements were full, visual field were full  Motor: The motor testing reveals 5 over 5 strength of all 4 extremities. Good symmetric motor tone is noted throughout.  Gait and station: Gait is normal.    DIAGNOSTIC DATA (LABS, IMAGING, TESTING) - I reviewed patient records, labs, notes, testing and imaging myself where available.  No flowsheet data found.   Lab Results  Component Value Date   WBC 9.1 06/26/2019   HGB 12.1 08/21/2019   HCT 38.1 06/26/2019   MCV 76 (L) 06/26/2019   PLT 287 06/26/2019      Component Value Date/Time   NA 139 06/26/2019 1544   K 4.5 06/26/2019 1544   CL 101 06/26/2019 1544   CO2 24 06/26/2019 1544   GLUCOSE 105 (H) 06/26/2019 1544   GLUCOSE 96 08/13/2015 2120   BUN 9 06/26/2019 1544   CREATININE 0.66 06/26/2019 1544   CALCIUM 9.6 06/26/2019 1544   PROT 6.6 06/26/2019 1544   ALBUMIN 4.2 06/26/2019 1544   AST 14 06/26/2019 1544   ALT 20 06/26/2019 1544   ALKPHOS 90 06/26/2019 1544   BILITOT 0.4 06/26/2019 1544   GFRNONAA 129 06/26/2019 1544   GFRAA 149 06/26/2019 1544   No results found for: CHOL, HDL, LDLCALC, LDLDIRECT, TRIG, CHOLHDL Lab Results  Component Value Date   HGBA1C 5.6 07/01/2021   Lab Results  Component Value Date   VITAMINB12 472 06/26/2019   Lab Results  Component Value Date   TSH 1.710 06/26/2019     ASSESSMENT AND PLAN 20 y.o. year old female  has a past medical history of Acne vulgaris, GERD (gastroesophageal reflux disease), Helicobacter  pylori gastritis, Hidradenitis suppurativa, Neuropathy, Pharyngitis  (11/13/2019), Pre-diabetes, Seasonal allergies, and Sleep apnea. here with     ICD-10-CM   1. OSA on CPAP  G47.33 For home use only DME continuous positive airway pressure (CPAP)   Z99.89         Lauren Obrien is doing well on CPAP therapy. Compliance report reveals acceptable daily but sub optimal 4 hour usage. I have encouraged her to call DME to update supplies. May condier new mask if she wishes. She will ask about returning to previous tubing (most likely heated tubing). I have advised follow up with PCP regarding seasonal allergies and sehe may consider Aquaphor or Vaseline to nares for dryness. She was encouraged to continue using CPAP nightly and for greater than 4 hours each night. We will update supply orders as indicated. Risks of untreated sleep apnea review and education materials provided. Healthy lifestyle habits encouraged. She will follow up in 6 months, sooner if needed. She verbalizes understanding and agreement with this plan.    Orders Placed This Encounter  Procedures   For home use only DME continuous positive airway pressure (CPAP)    Supplies    Order Specific Question:   Length of Need    Answer:   Lifetime    Order Specific Question:   Patient has OSA or probable OSA    Answer:   Yes    Order Specific Question:   Is the patient currently using CPAP in the home    Answer:   Yes    Order Specific Question:   Settings    Answer:   Other see comments    Order Specific Question:   CPAP supplies needed    Answer:   Mask, headgear, cushions, filters, heated tubing and water chamber     No orders of the defined types were placed in this encounter.     Shawnie Dapper, FNP-C 11/05/2021, 10:17 AM [facility] [address], [address] [place], [place] [address] [phone]

## 2021-11-05 NOTE — Progress Notes (Signed)
CM sent to [facility] 

## 2021-11-05 NOTE — Patient Instructions (Signed)
Please continue using your CPAP regularly. While your insurance requires that you use CPAP at least 4 hours each night on 70% of the nights, I recommend, that you not skip any nights and use it throughout the night if you can. Getting used to CPAP and staying with the treatment long term does take time and patience and discipline. Untreated obstructive sleep apnea when it is moderate to severe can have an adverse impact on cardiovascular health and raise her risk for heart disease, arrhythmias, hypertension, congestive heart failure, stroke and diabetes. Untreated obstructive sleep apnea causes sleep disruption, nonrestorative sleep, and sleep deprivation. This can have an impact on your day to day functioning and cause daytime sleepiness and impairment of cognitive function, memory loss, mood disturbance, and problems focussing. Using CPAP regularly can improve these symptoms.   Please call DME to get new supplies. Discuss tubing with them. Consider switching mask if you feel that it is too easy to get off.   Follow up with me in 6 months

## 2021-11-12 ENCOUNTER — Telehealth: Payer: Self-pay

## 2021-11-12 NOTE — Telephone Encounter (Signed)
Patient calls nurse line reporting cold symptoms for ~1.5 weeks. Patient reports at first she felt feverish with body aches, however this has resolved. Patient reports congestion, runny nose, cough and scratchy throat. Patient has an apt scheduled already for 1/3.  Conservative measures given to patient as well OTC medications for decongestants.

## 2021-11-17 ENCOUNTER — Ambulatory Visit: Payer: Medicaid Other | Admitting: Student

## 2021-11-17 ENCOUNTER — Encounter: Payer: Self-pay | Admitting: Student

## 2021-11-17 ENCOUNTER — Other Ambulatory Visit: Payer: Self-pay

## 2021-11-17 VITALS — BP 115/81 | HR 84 | Ht 66.0 in | Wt 302.4 lb

## 2021-11-17 DIAGNOSIS — R052 Subacute cough: Secondary | ICD-10-CM

## 2021-11-17 DIAGNOSIS — K219 Gastro-esophageal reflux disease without esophagitis: Secondary | ICD-10-CM | POA: Diagnosis not present

## 2021-11-17 DIAGNOSIS — R059 Cough, unspecified: Secondary | ICD-10-CM

## 2021-11-17 HISTORY — DX: Cough, unspecified: R05.9

## 2021-11-17 NOTE — Patient Instructions (Signed)
It was great to see you today! Thank you for choosing [facility] for your primary care. Lauren Obrien was seen for persistent cough.  At this time I would advise continued use of over-the-counter medications such as Delsym, throat lozenges, Flonase.  I also advised taking your omeprazole on a regular basis as reflux very much presents as worsened cough at night which may linger throughout the day.  You may also try using a humidifier at night and staying properly hydrated to help with the throat irritation.  You should return to our clinic in 2 weeks for follow-up of persistent cough.   I recommend that you always bring your medications to each appointment as this makes it easy to ensure you are on the correct medications and helps [place] not miss refills when you need them.  Please arrive 15 minutes before your appointment to ensure smooth check in process.  We appreciate your efforts in making this happen.  Take care and seek immediate care sooner if you develop any concerns.   Thank you for allowing me to participate in your care, Shelby Mattocks, DO 11/17/2021, 2:15 PM PGY-1, [facility]

## 2021-11-17 NOTE — Assessment & Plan Note (Signed)
Restart omeprazole 40 mg daily given sore throat and continue dry cough at night that appears postviral exacerbated.

## 2021-11-17 NOTE — Assessment & Plan Note (Signed)
Patient presents with post viral persistent cough.  Patient appears nontoxic and believes to be commendation of post viral illness with likely exacerbation from a poor reflux control given medication nonadherence and allergies.  Advised patient to use Flonase for nasal congestion, restart omeprazole for reflux, and he is OTC Delsym for cough suppression.  May also consider humidifier and ensure adequate hydration.  Follow-up in 2 weeks if cough continues to persist.

## 2021-11-17 NOTE — Progress Notes (Signed)
°  SUBJECTIVE:   CHIEF COMPLAINT / HPI:   Patient presents with consistent cough after experiencing viral illness 2 weeks prior.  She initially presented with cough, congestion, sore throat, body aches, runny nose, heavy eyes.  She endorses sick contacts at that time.  She now only has cough and nasal congestion with the cough primarily being at night and partially throughout the day.  Denies any mucus production at this time.  Denies nausea and vomiting. She is tried to [employer] and Riccola lozenges in addition to honey. These have only minimally worked.  She denies recent use of any allergy medications, Flonase, or her omeprazole for GERD.  She is also not been using her CPAP as often at night because it irritates her.  PERTINENT  PMH / PSH: GERD, allergies, OSA  OBJECTIVE:  BP 115/81    Pulse 84    Ht 5\' 6"  (1.676 m)    Wt (!) 302 lb 6.4 oz (137.2 kg)    SpO2 99%    BMI 48.81 kg/m   General: NAD, pleasant, able to participate in exam HEENT: cerumen impaction of left side, right TM normal, oropharynx clear Cardiac: RRR, no murmurs. Respiratory: CTAB, normal effort, No wheezes, rales or rhonchi.  Dry cough without sputum production.  ASSESSMENT/PLAN:  Cough Patient presents with post viral persistent cough.  Patient appears nontoxic and believes to be commendation of post viral illness with likely exacerbation from a poor reflux control given medication nonadherence and allergies.  Advised patient to use Flonase for nasal congestion, restart omeprazole for reflux, and he is OTC Delsym for cough suppression.  May also consider humidifier and ensure adequate hydration.  Follow-up in 2 weeks if cough continues to persist.    , DO 11/17/2021, 2:29 PM PGY-1, [facility]

## 2021-12-04 DIAGNOSIS — G4733 Obstructive sleep apnea (adult) (pediatric): Secondary | ICD-10-CM | POA: Diagnosis not present

## 2021-12-17 ENCOUNTER — Telehealth: Payer: Self-pay | Admitting: Neurology

## 2021-12-17 DIAGNOSIS — R200 Anesthesia of skin: Secondary | ICD-10-CM

## 2021-12-17 NOTE — Telephone Encounter (Signed)
Pt called in stating Dr. Allena Katz wanted her to do an EMG a while back, but she didn't want to do one. She now says she would like to have one done. I didn't see an order for one.

## 2021-12-17 NOTE — Telephone Encounter (Signed)
Order has been placed for EMG BUE.

## 2022-01-01 ENCOUNTER — Other Ambulatory Visit: Payer: Self-pay

## 2022-01-01 MED ORDER — IBUPROFEN 400 MG PO TABS: 400.0000 mg | ORAL_TABLET | Freq: Three times a day (TID) | ORAL | 3 refills | Status: DC | PRN

## 2022-01-27 ENCOUNTER — Ambulatory Visit: Payer: Medicaid Other | Admitting: Family Medicine

## 2022-01-28 ENCOUNTER — Encounter: Payer: Medicaid Other | Admitting: Neurology

## 2022-02-09 ENCOUNTER — Other Ambulatory Visit: Payer: Self-pay

## 2022-02-09 ENCOUNTER — Ambulatory Visit (INDEPENDENT_AMBULATORY_CARE_PROVIDER_SITE_OTHER): Payer: Medicaid Other | Admitting: Family Medicine

## 2022-02-09 VITALS — BP 127/82 | HR 92 | Temp 99.0°F | Wt 303.8 lb

## 2022-02-09 DIAGNOSIS — R059 Cough, unspecified: Secondary | ICD-10-CM

## 2022-02-09 DIAGNOSIS — G4733 Obstructive sleep apnea (adult) (pediatric): Secondary | ICD-10-CM | POA: Diagnosis not present

## 2022-02-09 DIAGNOSIS — R0981 Nasal congestion: Secondary | ICD-10-CM

## 2022-02-09 DIAGNOSIS — J069 Acute upper respiratory infection, unspecified: Secondary | ICD-10-CM | POA: Diagnosis not present

## 2022-02-09 NOTE — Patient Instructions (Signed)
It was wonderful to see you today. Thank you for allowing me to be a part of your care. Below is a short summary of what we discussed at your visit today: ? ?Viral upper respiratory infection ?Today it looks like you have a viral upper respiratory infection.  I do not see any evidence of pneumonia or other infection that requires antibiotics. ? ?Today we swabbed you for COVID and flu.  These results should come back in 1 to 3 days.  You will be able to see them on your MyChart. If the results are normal, I will send you a letter or MyChart message. If the results are abnormal, I will give you a call.  Even if you have COVID or flu, this will not change my recommendations for care based on how far you are into your illness. ? ?I recommend symptomatic management at this point, including no spray, sinus rinses, humidifier at the bedside, Benadryl or other antihistamine such as Zyrtec, cough drops, and throat spray. ? ?Fluids: make sure your child drinks enough water or Pedialyte; for older kids Gatorade is okay too. Signs of dehydration are not making tears or urinating less than once every 8-10 hours. ? ?Treatment:  ?- give 1 tablespoon of honey 3-4 times a day.  ?- You can also mix honey and lemon in chamomille or peppermint tea.  ?- You can use nasal saline to loosen nose mucus ?- Place a humidifier next to her bed while she sleeps ?- If someone is taking a hot shower, let her sit in the bathroom to breathe in the steam ?- research studies show that honey works better than cough medicine. Do not give kids cough medicine; every year in the [place] kids overdose on cough medicine.  ? ?Timeline:  ?- fever, runny nose, and fussiness get worse up to day 4 or 5, but then get better ?- it can take 2-3 weeks for cough to completely go away ?- cough may take weeks to resolve ? ?Reasons to return for care include if: ?- is having trouble eating  ?- is acting very sleepy and not waking up to eat ?- is having trouble  breathing or turns blue ?- is dehydrated (stops making tears or has less than 1 wet diaper every 8-10 hours) ? ? ? ?Please bring all of your medications to every appointment! ? ?If you have any questions or concerns, please do not hesitate to contact us via phone or MyChart message.  ? ?Fayette Pho, MD  ?

## 2022-02-10 ENCOUNTER — Other Ambulatory Visit: Payer: Self-pay

## 2022-02-10 DIAGNOSIS — J069 Acute upper respiratory infection, unspecified: Secondary | ICD-10-CM

## 2022-02-10 MED ORDER — CETIRIZINE HCL 10 MG PO TABS: 10.0000 mg | ORAL_TABLET | Freq: Every day | ORAL | 11 refills | Status: DC | PRN

## 2022-02-10 NOTE — Assessment & Plan Note (Signed)
Acute.  No red flags, appears nontoxic and well-hydrated.  Suspect viral URI versus seasonal allergies.  She requests flu and COVID testing.  Discussed inability to prescribe medication such as Paxlovid or Tamiflu given how far out she is from starting symptoms.  Conservative measures discussed, return precautions covered.  See AVS for more. ?

## 2022-02-10 NOTE — Progress Notes (Signed)
? ? ?  SUBJECTIVE:  ? ?CHIEF COMPLAINT / HPI:  ? ?URI-like symptoms ?- 5-day history of scratchy throat, 3-day history of body aches, subjective fever, congestion, headache, decreased appetite, "heavy eyes" ?- Symptoms follow [place] 3/19 with a church group ?- Only known sick contact is son who has similar symptoms with similar timeline ?- Denies objective fever, nausea, vomiting, diarrhea, constipation, weight loss ?- Normal fluid intake and UOP ?- Fully vaccinated for flu, not vaccinated for COVID ? ?PERTINENT  PMH / PSH: History of COVID, allergic rhinitis, OSA, chronic fatigue, prediabetes, obesity ? ?OBJECTIVE:  ? ?BP 127/82   Pulse 92   Temp 99 ?F (37.2 ?C) (Oral)   Wt (!) 303 lb 12.8 oz (137.8 kg)   SpO2 100%   BMI 49.03 kg/m?   ? ? ?PHQ-9:  ? ?  02/09/2022  ?  9:30 AM 11/17/2021  ?  1:45 PM 07/29/2021  ?  2:41 PM  ?Depression screen PHQ 2/9  ?Decreased Interest 0 1 0  ?Down, Depressed, Hopeless 0 0 0  ?PHQ - 2 Score 0 1 0  ?Altered sleeping 0 0 1  ?Tired, decreased energy 0 0 1  ?Change in appetite 0 0 0  ?Feeling bad or failure about yourself  0 0 0  ?Trouble concentrating 0 0 0  ?Moving slowly or fidgety/restless 0 0 0  ?Suicidal thoughts 0 0 0  ?PHQ-9 Score 0 1 2  ?  ?Physical Exam ?General: Awake, alert, oriented ?HEENT: PERRL, sclera anicteric, bilateral external auditory canals completely occluded by cerumen nasal mucosa slightly edematous, oral mucosa pink, moist, without lesion, intact dentition without obvious cavity ?Lymph: No palpable lymphedema of head or neck ?Cardiovascular: Regular rate and rhythm, S1 and S2 present, no murmurs auscultated, brisk cap refill, normal skin turgor ?Respiratory: Lung fields clear to auscultation bilaterally ? ?ASSESSMENT/PLAN:  ? ?Viral URI ?Acute.  No red flags, appears nontoxic and well-hydrated.  Suspect viral URI versus seasonal allergies.  She requests flu and COVID testing.  Discussed inability to prescribe medication such as Paxlovid or Tamiflu  given how far out she is from starting symptoms.  Conservative measures discussed, return precautions covered.  See AVS for more. ?  ? ? ?Fayette Pho, MD ?[facility]  ?

## 2022-02-11 LAB — COVID-19, FLU A+B NAA
Influenza A, NAA: NOT DETECTED
Influenza B, NAA: NOT DETECTED
SARS-CoV-2, NAA: DETECTED — AB

## 2022-02-12 ENCOUNTER — Telehealth: Payer: Self-pay | Admitting: Family Medicine

## 2022-02-12 NOTE — Telephone Encounter (Signed)
Called to discuss results. Left HIPAA safe VM. At the time test was collected, patient was already on day 5 of illness.  She should wear a mask around any nonhousehold persons for the next 5 days. ? ?Continue supportive management.  Call with questions. ? ?Fayette Pho, MD ? ?

## 2022-02-18 DIAGNOSIS — M778 Other enthesopathies, not elsewhere classified: Secondary | ICD-10-CM | POA: Diagnosis not present

## 2022-02-24 ENCOUNTER — Telehealth: Payer: Self-pay | Admitting: Neurology

## 2022-02-24 NOTE — Telephone Encounter (Signed)
The test takes little shocks to test the nerve responses and may aggravate her symptoms temporarily, but it is very brief.  The second part takes a needle that goes into the the muscle to check nerve response.  It is somewhat painful/unpleasant and if she does not want to proceed with testing, it can be stopped at anytime.   ?

## 2022-02-24 NOTE — Telephone Encounter (Signed)
Patient has an EMG tomorrow and may cancel based on the answer to this question: Will the EMG inflame any carpal tunnel symptoms she has? ? ?Patient stated she has a lot going on right now and wants to be sure before she gets the test. ?

## 2022-02-24 NOTE — Telephone Encounter (Signed)
Called patient and informed her information from Dr. Allena Katz stated below. Patient stated she will proceed with testing had no further questions or concerns. ?

## 2022-02-25 ENCOUNTER — Ambulatory Visit (INDEPENDENT_AMBULATORY_CARE_PROVIDER_SITE_OTHER): Payer: Medicaid Other | Admitting: Neurology

## 2022-02-25 DIAGNOSIS — R200 Anesthesia of skin: Secondary | ICD-10-CM | POA: Diagnosis not present

## 2022-02-25 NOTE — Procedures (Signed)
[facility]  ?[address], [address] ? [place], [place] [address] ?Tel: [phone] ?Fax:  [phone] ?Test Date:  02/25/2022 ? ?Patient: Lauren Obrien WYOVZC DOB: 31-Oct-2001 Physician: Nita Sickle, DO  ?Sex: Female Height: 5\' 6"  Ref Phys: , DO  ?ID#: Nita Sickle   Technician:   ? ?Patient Complaints: ?This is a 21 year old female referred for evaluation of bilateral arm paresthesias. ? ?NCV & EMG Findings: ?Extensive electrodiagnostic testing of the right upper extremity and additional studies of the left shows: ?Bilateral median, ulnar, and mixed palmar sensory responses are within normal limits. ?Bilateral median and ulnar motor responses are within normal limits. ?There is no evidence of active or chronic motor axonal loss changes affecting any of the tested muscles.  Motor unit configuration and recruitment pattern is within normal limits. ? ? ?Impression: ?This is a normal study of the upper extremities.  In particular, there is no evidence of carpal tunnel syndrome or a cervical radiculopathy. ? ? ?___________________________ ?36, DO ? ? ? ?Nerve Conduction Studies ?Anti Sensory Summary Table ? ? Stim Site NR Peak (ms) Norm Peak (ms) P-T Amp (?V) Norm P-T Amp  ?Left Median Anti Sensory (2nd Digit)  34?C  ?Wrist    2.6 <3.3 48.9 >20  ?Right Median Anti Sensory (2nd Digit)  ?Wrist    2.5 <3.3 47.6 >20  ?Left Ulnar Anti Sensory (5th Digit)  34?C  ?Wrist    2.4 <3.0 45.5 >18  ?Right Ulnar Anti Sensory (5th Digit)  34?C  ?Wrist    2.3 <3.0 42.8 >18  ? ?Motor Summary Table ? ? Stim Site NR Onset (ms) Norm Onset (ms) O-P Amp (mV) Norm O-P Amp Site1 Site2 Delta-0 (ms) Dist (cm) Vel (m/s) Norm Vel (m/s)  ?Left Median Motor (Abd Poll Brev)  34?C  ?Wrist    2.1 <3.9 10.7 >6 Elbow Wrist 4.3 28.0 65 >51  ?Elbow    6.4  10.7         ?Right Median Motor (Abd Poll Brev)  34?C  ?Wrist    2.2 <3.9 10.8 >6 Elbow Wrist 4.4 28.0 64 >51  ?Elbow    6.6  10.4         ?Left Ulnar Motor (Abd Dig  Minimi)  34?C  ?Wrist    2.0 <3.0 10.9 >8 B Elbow Wrist 3.2 23.0 72 >51  ?B Elbow    5.2  10.0  A Elbow B Elbow 1.4 10.0 71 >51  ?A Elbow    6.6  9.4         ?Right Ulnar Motor (Abd Dig Minimi)  34?C  ?Wrist    1.6 <3.0 10.6 >8 B Elbow Wrist 3.6 24.0 67 >51  ?B Elbow    5.2  10.5  A Elbow B Elbow 1.5 10.0 67 >51  ?A Elbow    6.7  10.2         ? ?Comparison Summary Table ? ? Stim Site NR Peak (ms) Norm Peak (ms) P-T Amp (?V) Site1 Site2 Delta-P (ms) Norm Delta (ms)  ?Left Median/Ulnar Palm Comparison (Wrist - 8cm)  34?C  ?Median Palm    1.3 <2.2 81.7 Median Palm Ulnar Palm 0.0   ?Ulnar Palm    1.3 <2.2 15.9      ?Right Median/Ulnar Palm Comparison (Wrist - 8cm)  34?C  ?Median Palm    1.5 <2.2 82.4 Median Palm Ulnar Palm 0.3   ?Ulnar Palm    1.2 <2.2 19.4      ? ?  EMG ? ? Side Muscle Ins Act Fibs Psw Fasc Number Recrt Dur Dur. Amp Amp. Poly Poly. Comment  ?Right 1stDorInt Nml Nml Nml Nml Nml Nml Nml Nml Nml Nml Nml Nml N/A  ?Right PronatorTeres Nml Nml Nml Nml Nml Nml Nml Nml Nml Nml Nml Nml N/A  ?Right Biceps Nml Nml Nml Nml Nml Nml Nml Nml Nml Nml Nml Nml N/A  ?Right Triceps Nml Nml Nml Nml Nml Nml Nml Nml Nml Nml Nml Nml N/A  ?Right Deltoid Nml Nml Nml Nml Nml Nml Nml Nml Nml Nml Nml Nml N/A  ?Left 1stDorInt Nml Nml Nml Nml Nml Nml Nml Nml Nml Nml Nml Nml N/A  ?Left PronatorTeres Nml Nml Nml Nml Nml Nml Nml Nml Nml Nml Nml Nml N/A  ?Left Biceps Nml Nml Nml Nml Nml Nml Nml Nml Nml Nml Nml Nml N/A  ?Left Triceps Nml Nml Nml Nml Nml Nml Nml Nml Nml Nml Nml Nml N/A  ?Left Deltoid Nml Nml Nml Nml Nml Nml Nml Nml Nml Nml Nml Nml N/A  ? ? ? ? ?Waveforms: ?    ? ?    ? ?    ? ?  ? ? ?

## 2022-03-19 ENCOUNTER — Ambulatory Visit (INDEPENDENT_AMBULATORY_CARE_PROVIDER_SITE_OTHER): Payer: Medicaid Other | Admitting: Family Medicine

## 2022-03-19 ENCOUNTER — Encounter: Payer: Self-pay | Admitting: Family Medicine

## 2022-03-19 VITALS — BP 122/78 | HR 96 | Ht 66.0 in | Wt 313.2 lb

## 2022-03-19 DIAGNOSIS — R202 Paresthesia of skin: Secondary | ICD-10-CM

## 2022-03-19 LAB — POCT GLYCOSYLATED HEMOGLOBIN (HGB A1C): HbA1c, POC (prediabetic range): 5.8 % (ref 5.7–6.4)

## 2022-03-19 NOTE — Progress Notes (Signed)
    SUBJECTIVE:   CHIEF COMPLAINT / HPI:   Paresthesias Patient presents today for follow-up on the numbness in her hands bilaterally.  She reports she is still having intermittent episodes of this and she is concerned she is getting worse in her right hand.  Patient has been evaluated by Neurology as well as sports medicine.  She had nerve conduction test performed which did not indicate any active or chronic motor axon loss.  Patient would also like lab test performed today.  Can identify any causes of the numbness and reports that it goes all the way up to her shoulder sometimes.  Denies any weakness at this time.  Denies any numbness at this time.  OBJECTIVE:   BP 122/78   Pulse 96   Ht 5\' 6"  (1.676 m)   Wt (!) 313 lb 3.2 oz (142.1 kg)   SpO2 98%   BMI 50.55 kg/m   General: Pleasant 21 year old female in no acute distress Cardiac: Regular rate  Respiratory: Normal work of breathing, speaking in full sentences MSK: Wrist brace on right wrist.  No decreased grip strength bilaterally, no upper extremity weakness bilaterally, ambulates without difficulty  ASSESSMENT/PLAN:   Morbid obesity (HCC) Discussed weight with patient.  Collected lipid panel as well as hemoglobin A1c today.  Hemoglobin A1c within normal limits.  We will continue to monitor for signs of diabetes and repeat in the future.  Paresthesias Unclear etiology for the paresthesias.  Has been evaluated by neurology as well as sports medicine.  Specialist do not feel it is carpal tunnel and patient has had nerve conductions test completed with no abnormalities found.  We will check TSH, Vitamin B12, RPR, HIV to look for other causes of this.     36, MD [facility]

## 2022-03-19 NOTE — Patient Instructions (Signed)
It was good seeing you today.  I am sorry which did not have a great answer for why you keep having this numbness.  I am checking a number of labs and will call you with the results if there are any abnormalities or send you a MyChart message if everything is okay.  Regarding the right-sided chest pain that you are having I would like for you to keep track of what you are doing when you experience this pain, which you have had to eat, what helps it resolve.  If you have any questions or concerns call the clinic.  I hope you have a great afternoon! ?

## 2022-03-20 DIAGNOSIS — R202 Paresthesia of skin: Secondary | ICD-10-CM | POA: Insufficient documentation

## 2022-03-20 LAB — SYPHILIS: RPR W/REFLEX TO RPR TITER AND TREPONEMAL ANTIBODIES, TRADITIONAL SCREENING AND DIAGNOSIS ALGORITHM: RPR Ser Ql: NONREACTIVE

## 2022-03-20 LAB — HIV ANTIBODY (ROUTINE TESTING W REFLEX): HIV Screen 4th Generation wRfx: NONREACTIVE

## 2022-03-20 LAB — LIPID PANEL
Chol/HDL Ratio: 4.1 ratio (ref 0.0–4.4)
Cholesterol, Total: 168 mg/dL (ref 100–199)
HDL: 41 mg/dL (ref >39)
LDL Chol Calc (NIH): 100 mg/dL — ABNORMAL HIGH (ref 0–99)
Triglycerides: 151 mg/dL — ABNORMAL HIGH (ref 0–149)
VLDL Cholesterol Cal: 27 mg/dL (ref 5–40)

## 2022-03-20 LAB — VITAMIN B12: Vitamin B-12: 588 pg/mL (ref 232–1245)

## 2022-03-20 LAB — TSH RFX ON ABNORMAL TO FREE T4: TSH: 1.4 u[IU]/mL (ref 0.450–4.500)

## 2022-03-20 NOTE — Assessment & Plan Note (Signed)
Unclear etiology for the paresthesias.  Has been evaluated by neurology as well as sports medicine.  Specialist do not feel it is carpal tunnel and patient has had nerve conductions test completed with no abnormalities found.  We will check TSH, Vitamin B12, RPR, HIV to look for other causes of this. ?

## 2022-03-20 NOTE — Assessment & Plan Note (Signed)
Discussed weight with patient.  Collected lipid panel as well as hemoglobin A1c today.  Hemoglobin A1c within normal limits.  We will continue to monitor for signs of diabetes and repeat in the future. ?

## 2022-03-23 ENCOUNTER — Other Ambulatory Visit: Payer: Self-pay | Admitting: Family Medicine

## 2022-03-23 DIAGNOSIS — J069 Acute upper respiratory infection, unspecified: Secondary | ICD-10-CM

## 2022-03-26 ENCOUNTER — Ambulatory Visit (INDEPENDENT_AMBULATORY_CARE_PROVIDER_SITE_OTHER): Payer: Medicaid Other | Admitting: Family Medicine

## 2022-03-26 VITALS — BP 135/90 | HR 113 | Temp 100.4°F | Wt 308.4 lb

## 2022-03-26 DIAGNOSIS — R509 Fever, unspecified: Secondary | ICD-10-CM

## 2022-03-26 DIAGNOSIS — J069 Acute upper respiratory infection, unspecified: Secondary | ICD-10-CM

## 2022-03-26 DIAGNOSIS — J029 Acute pharyngitis, unspecified: Secondary | ICD-10-CM | POA: Diagnosis not present

## 2022-03-26 LAB — POCT RAPID STREP A (OFFICE): Rapid Strep A Screen: POSITIVE — AB

## 2022-03-26 MED ORDER — ACETAMINOPHEN 500 MG PO TABS: 500.0000 mg | ORAL_TABLET | Freq: Once | ORAL | Status: DC

## 2022-03-26 MED ORDER — AMOXICILLIN 500 MG PO CAPS: 500.0000 mg | ORAL_CAPSULE | Freq: Two times a day (BID) | ORAL | 0 refills | Status: DC

## 2022-03-26 MED ORDER — ACETAMINOPHEN 500 MG PO TABS: 500.0000 mg | ORAL_TABLET | Freq: Four times a day (QID) | ORAL | 0 refills | Status: DC | PRN

## 2022-03-26 NOTE — Patient Instructions (Signed)
It was great seeing you today.  I am sorry you feel so poor.  I sent a prescription for Tylenol to your pharmacy which you can take every 6 hours.  I also sent a prescription for an antibiotic called amoxicillin.  This is the treatment for the strep infection that you tested positive for in the clinic today.  We also tested for COVID/RSV/flu.  The COVID may come back positive because he recently had the infection and sometimes it takes a while to clear.  I feel that your symptoms are likely all due to the Streptococcus so please be sure to take the antibiotics as prescribed.  If your symptoms worsen or you have any questions or concerns please call the clinic.  I hope you have a wonderful day! ? ? ?

## 2022-03-26 NOTE — Assessment & Plan Note (Addendum)
Patient with sore throat and other symptoms consistent with viral URI versus strep throat.  Febrile on arrival.  Patient requesting strep throat testing.  Rapid strep test positive.  Patient may also have concomitant viral URI.  We will treat with amoxicillin 500 mg twice daily for 10 days.  Patient also requesting to be tested for COVID so COVID/RSV/flu test collected today.  Discussed supportive care measures including Tylenol, sent prescription for Tylenol to patient's pharmacy.  Discussed making sure she is adequately hydrated hot tea with honey.  Return precautions given. ?

## 2022-03-26 NOTE — Progress Notes (Signed)
? ? ?  SUBJECTIVE:  ? ?CHIEF COMPLAINT / HPI:  ? ?Rhinorrhea, fever, body aches, sore throat ?Patient reports that her symptoms started 1 day ago where she noticed a sore throat with a runny nose.  She is also been sneezing.  She reports last night she did not sleep well and that she had chills and may have had a fever overnight.  While at home today she had a fever of 100.9 ?F.  She has not taken any medications for this.  Her son has been having similar symptoms.  She was diagnosed with COVID on 3/28 but recovered from this. ? ?OBJECTIVE:  ? ?BP 135/90   Pulse (!) 113   Temp (!) 100.4 ?F (38 ?C) (Oral)   Wt (!) 308 lb 6.4 oz (139.9 kg)   SpO2 100%   BMI 49.78 kg/m?   ?General: Ill-appearing 21 year old female ?HEENT: Moist mucous membranes, erythema posterior pharynx, erythema nasal turbinates with rhinorrhea and congestion present ?Cardiac: Regular rate ?Respiratory: Normal work of breathing, speaking in full sentences ?MSK: No gross abnormalities ? ? ?ASSESSMENT/PLAN:  ? ?Sore throat ?Patient with sore throat and other symptoms consistent with viral URI versus strep throat.  Febrile on arrival.  Patient requesting strep throat testing.  Rapid strep test positive.  Patient may also have concomitant viral URI.  We will treat with amoxicillin 500 mg twice daily for 10 days.  Patient also requesting to be tested for COVID so COVID/RSV/flu test collected today.  Discussed supportive care measures including Tylenol, sent prescription for Tylenol to patient's pharmacy.  Discussed making sure she is adequately hydrated hot tea with honey.  Return precautions given. ?  ? ? ?Derrel Nip, MD ?[facility]  ? ?

## 2022-03-28 LAB — COVID-19, FLU A+B AND RSV
Influenza A, NAA: NOT DETECTED
Influenza B, NAA: NOT DETECTED
RSV, NAA: NOT DETECTED
SARS-CoV-2, NAA: NOT DETECTED

## 2022-04-13 ENCOUNTER — Other Ambulatory Visit: Payer: Self-pay | Admitting: Family Medicine

## 2022-04-13 DIAGNOSIS — Z59819 Housing instability, housed unspecified: Secondary | ICD-10-CM

## 2022-04-13 NOTE — Progress Notes (Signed)
Saw patient's mother today. She requested SW consult as she is having housing difficulties and would like resources. Her phone number is [phone].  Shirlean Mylar, MD [facility], PGY-3

## 2022-04-14 ENCOUNTER — Telehealth: Payer: Self-pay | Admitting: *Deleted

## 2022-04-14 NOTE — Telephone Encounter (Signed)
   Telephone encounter was:  Successful.  04/14/2022 Name: Lauren Obrien MRN: [id number] DOB: Jun 13, 2001  Lauren Obrien is a 21 y.o. year old female who is a primary care patient of Derrel Nip, MD . The community resource team was consulted for assistance with Transportation Needs  and Food Insecurity  Care guide performed the following interventions: Patient provided with information about care guide support team and interviewed to confirm resource needs.  Follow Up Plan:  Care guide will follow up with patient by phone over the next days  Alois Cliche -[facility] Guide , Embedded Care Coordination [employer], Care Management  [phone] [address] [place] , [place] [address] Email : [email or web address]

## 2022-04-20 ENCOUNTER — Encounter: Payer: Self-pay | Admitting: *Deleted

## 2022-04-20 ENCOUNTER — Telehealth: Payer: Self-pay | Admitting: *Deleted

## 2022-04-20 NOTE — Telephone Encounter (Signed)
   Telephone encounter was:  Unsuccessful.  04/20/2022 Name: Gabreille Dardis MRN: [id number] DOB: [birth year]  Unsuccessful outbound call made today to assist with:  Transportation Needs , Food Insecurity, and Patient stated previously a lot going on but wanted to talk at a more private time , would return call , so far no return call and left message today   Outreach Attempt:  2nd Attempt  A HIPAA compliant voice message was left requesting a return call.  Instructed patient to call back at   Instructed patient to call back at [phone]  at their earliest convenience. Yehuda Mao Greenauer -[facility] Guide , Embedded Care Coordination [facility], Care Management  [phone] [address] [place] , [place] [address] Email : [email or web address]

## 2022-04-23 ENCOUNTER — Telehealth: Payer: Self-pay | Admitting: *Deleted

## 2022-04-23 NOTE — Telephone Encounter (Signed)
   Telephone encounter was:  Successful.  04/23/2022 Name: Lauren Obrien MRN: [id number] DOB: [birth year]  Lauren Obrien is a 21 y.o. year old female who is a primary care patient of Derrel Nip, MD . The community resource team was consulted for assistance with  housing  Care guide performed the following interventions: Patient provided with information about care guide support team and interviewed to confirm resource needs.Patient says they are ok now will let office know if they need assistance later  Follow Up Plan:  No further follow up planned at this time. The patient has been provided with needed resources.  Alois Cliche -[facility] Guide , Embedded Care Coordination [facility], Care Management  [phone] [address] [place] , [place] [address] Email : [email or web address]

## 2022-04-29 DIAGNOSIS — G4733 Obstructive sleep apnea (adult) (pediatric): Secondary | ICD-10-CM | POA: Diagnosis not present

## 2022-05-03 ENCOUNTER — Ambulatory Visit (INDEPENDENT_AMBULATORY_CARE_PROVIDER_SITE_OTHER): Payer: Medicaid Other | Admitting: Family Medicine

## 2022-05-03 ENCOUNTER — Encounter: Payer: Self-pay | Admitting: Family Medicine

## 2022-05-03 VITALS — BP 108/72 | HR 86 | Ht 66.0 in | Wt 309.4 lb

## 2022-05-03 DIAGNOSIS — R198 Other specified symptoms and signs involving the digestive system and abdomen: Secondary | ICD-10-CM

## 2022-05-03 NOTE — Progress Notes (Unsigned)
    SUBJECTIVE:   CHIEF COMPLAINT / HPI:   Symptoms started ~2 weeks ago At first thought her belly button was sweating 3 days ago noticed it had a bad odor Mom looked and saw pus coming out No pain or itching Slightly pink, no redness No fever, chills No rash elsewhere on the body  PERTINENT  PMH / PSH: ***  OBJECTIVE:   Ht 5\' 6"  (1.676 m)   Wt (!) 309 lb 6.4 oz (140.3 kg)   BMI 49.94 kg/m   ***  ASSESSMENT/PLAN:   No problem-specific Assessment & Plan notes found for this encounter.     , MD [facility]

## 2022-05-03 NOTE — Progress Notes (Unsigned)
   SUBJECTIVE:   CHIEF COMPLAINT / HPI:   No chief complaint on file.    Lauren Obrien is a 21 y.o. female here for ***   Pt reports ***    PERTINENT  PMH / PSH: reviewed and updated as appropriate   OBJECTIVE:   There were no vitals taken for this visit.  ***  ASSESSMENT/PLAN:   No problem-specific Assessment & Plan notes found for this encounter.     Katha Cabal, DO PGY-3, [facility] 05/03/2022      {    This will disappear when note is signed, click to select method of visit    :1}

## 2022-05-03 NOTE — Patient Instructions (Addendum)
It was great to see you!  Your belly button does not look infected.  I would not worry about the drainage-- this is probably just moisture and sweat that accumulated  Do your best to keep the area clean and dry. After your shower or after any sweating, try to dry it with a Q-tip. You can also try a very small amount of baby powder to help keep the area dry.  If you develop new concerns such as spreading redness in the area, pain, or fever please let us know.  Call in July to schedule an appointment with your new PCP to discuss IUD removal.  Take care, Dr. Anner Crete

## 2022-05-04 DIAGNOSIS — R198 Other specified symptoms and signs involving the digestive system and abdomen: Secondary | ICD-10-CM | POA: Insufficient documentation

## 2022-05-04 NOTE — Assessment & Plan Note (Signed)
Likely related to excess moisture and sweating due to warm weather. Her anatomy predisposes to trapped moisture as her umbilicus is rather deep. No signs of infection or yeast. Reassurance provided. Advised to keep area clean and dry.

## 2022-05-07 DIAGNOSIS — G4733 Obstructive sleep apnea (adult) (pediatric): Secondary | ICD-10-CM | POA: Diagnosis not present

## 2022-05-12 ENCOUNTER — Encounter: Payer: Self-pay | Admitting: Family Medicine

## 2022-05-12 ENCOUNTER — Ambulatory Visit: Payer: Medicaid Other | Admitting: Family Medicine

## 2022-05-13 ENCOUNTER — Other Ambulatory Visit: Payer: Self-pay | Admitting: Gastroenterology

## 2022-05-20 ENCOUNTER — Other Ambulatory Visit: Payer: Self-pay

## 2022-05-21 MED ORDER — OMEPRAZOLE 40 MG PO CPDR: 40.0000 mg | DELAYED_RELEASE_CAPSULE | Freq: Every day | ORAL | 11 refills | Status: DC

## 2022-05-28 ENCOUNTER — Ambulatory Visit (INDEPENDENT_AMBULATORY_CARE_PROVIDER_SITE_OTHER): Payer: Medicaid Other | Admitting: Family Medicine

## 2022-05-28 VITALS — BP 110/68 | HR 61 | Wt 306.0 lb

## 2022-05-28 DIAGNOSIS — J029 Acute pharyngitis, unspecified: Secondary | ICD-10-CM | POA: Diagnosis not present

## 2022-05-28 DIAGNOSIS — K219 Gastro-esophageal reflux disease without esophagitis: Secondary | ICD-10-CM | POA: Diagnosis not present

## 2022-05-28 DIAGNOSIS — J020 Streptococcal pharyngitis: Secondary | ICD-10-CM | POA: Diagnosis not present

## 2022-05-28 LAB — POCT RAPID STREP A (OFFICE): Rapid Strep A Screen: POSITIVE — AB

## 2022-05-28 MED ORDER — AMOXICILLIN 500 MG PO CAPS: 500.0000 mg | ORAL_CAPSULE | Freq: Two times a day (BID) | ORAL | 0 refills | Status: DC

## 2022-05-28 NOTE — Progress Notes (Signed)
    SUBJECTIVE:   CHIEF COMPLAINT / HPI:   Patient presents with sensation that something in her throat, feels like sore throat. Denies fever, chills, dysphagia, nausea and vomiting. Feels like she has a weird sensation when she swallows but no associated pain. Has a history of GERD, was taking omeprazole but then stopped. Denies any epigastric pain or reflux sensation. Endorses congestion but no cough. Denies eating anything where something may have gotten stuck in her throat.   OBJECTIVE:   BP 110/68   Pulse 61   Wt (!) 306 lb (138.8 kg)   SpO2 98%   BMI 49.39 kg/m   General: Patient well-appearing, in no acute distress. HEENT: no edema or erythema noted, tonsillar erythema without exudate, non-tender thyroid, no evidence of cervical LAD CV: RRR, no murmurs or gallops auscultated Resp: CTAB, no wheezing, rales or rhonchi noted  Abdomen: soft, nontender, nondistended, presence of bowel sounds Ext: radial pulses strong and equal bilaterally   ASSESSMENT/PLAN:   Strep pharyngitis -rapid strep testing positive, seems to be a recurrent issue, possibly contributing to her throat sensation  -prescribed amoxicillin 500 mg bid for 10 days  Acid reflux -history of GERD, instructed to restart omeprazole daily -throat issue likely secondary to strep but also considering GERD. No concern for foreign body aspiration or pharyngeal abscess. Very low concern for esophageal stricture -plan to follow up in 1-2 weeks if symptoms persist, once strep is treated we can get a better idea of what may be causing her throat sensation if it has not improved     Gerrianne Aydelott Robyne Peers, DO [facility]

## 2022-05-28 NOTE — Patient Instructions (Addendum)
It was great seeing you today!  Today we discussed your sore throat, it seems like you have strep throat which is likely causing your symptoms. Please take amoxicillin 500 mg twice daily for 10 days. Please take omeprazole daily so that your reflux symptoms can improve as well.   If you notice any trouble or pain with swallowing then please return to see [place] again.   Please follow up at your next scheduled appointment, if anything arises between now and then, please don't hesitate to contact our office.   Thank you for allowing [place] to be a part of your medical care!  Thank you, Dr. Robyne Peers

## 2022-05-29 DIAGNOSIS — J020 Streptococcal pharyngitis: Secondary | ICD-10-CM

## 2022-05-29 HISTORY — DX: Streptococcal pharyngitis: J02.0

## 2022-05-29 NOTE — Assessment & Plan Note (Signed)
-  history of GERD, instructed to restart omeprazole daily -throat issue likely secondary to strep but also considering GERD. No concern for foreign body aspiration or pharyngeal abscess. Very low concern for esophageal stricture -plan to follow up in 1-2 weeks if symptoms persist, once strep is treated we can get a better idea of what may be causing her throat sensation if it has not improved

## 2022-05-29 NOTE — Assessment & Plan Note (Signed)
-  rapid strep testing positive, seems to be a recurrent issue, possibly contributing to her throat sensation  -prescribed amoxicillin 500 mg bid for 10 days

## 2022-06-03 DIAGNOSIS — F4311 Post-traumatic stress disorder, acute: Secondary | ICD-10-CM | POA: Diagnosis not present

## 2022-06-10 DIAGNOSIS — F4311 Post-traumatic stress disorder, acute: Secondary | ICD-10-CM | POA: Diagnosis not present

## 2022-06-17 DIAGNOSIS — F4311 Post-traumatic stress disorder, acute: Secondary | ICD-10-CM | POA: Diagnosis not present

## 2022-06-24 ENCOUNTER — Encounter: Payer: Self-pay | Admitting: Family Medicine

## 2022-06-24 ENCOUNTER — Ambulatory Visit: Payer: Medicaid Other | Admitting: Family Medicine

## 2022-06-24 VITALS — BP 106/62 | HR 77 | Wt 303.0 lb

## 2022-06-24 DIAGNOSIS — F4311 Post-traumatic stress disorder, acute: Secondary | ICD-10-CM | POA: Diagnosis not present

## 2022-06-24 DIAGNOSIS — R7303 Prediabetes: Secondary | ICD-10-CM

## 2022-06-24 DIAGNOSIS — M25572 Pain in left ankle and joints of left foot: Secondary | ICD-10-CM | POA: Diagnosis not present

## 2022-06-24 LAB — POCT GLYCOSYLATED HEMOGLOBIN (HGB A1C): HbA1c, POC (prediabetic range): 5.8 % (ref 5.7–6.4)

## 2022-06-24 NOTE — Assessment & Plan Note (Signed)
Most likely subacute sprain given timeline. Do not expect broken bone as no pain on palpation or with any movements, normal gait. Ott[place] is negative without recommendation for xray.  - Recommend icing intermittently  - Voltaren gel if needed  - Ankle strengthening exercises demonstrated and materials given  - F/u if worsens

## 2022-06-24 NOTE — Assessment & Plan Note (Signed)
A1c 5.7, 5.6. Has not had A1c in 11 months. No lifestyle changes or new medications.  - A1c today and follow up to discuss diabetes prevention

## 2022-06-24 NOTE — Patient Instructions (Signed)
It was great to see you today! Thank you for choosing [facility] for your primary care. Lauren Obrien was seen for left ankle pain.  Today we addressed: Left ankle pain - I believed you previously sprained your ankle. Use ice for 20 seconds at a time. You can use Voltaren gel over the counter. Please do ankle strengthening exercises.  Prediabetes - We discussed your last A1c which was normal. Because your previous one was high, we will check it again today and follow up.   If you haven't already, sign up for My Chart to have easy access to your labs results, and communication with your primary care physician.  We are checking some labs today. If they are abnormal, I will call you. If they are normal, I will send you a MyChart message (if it is active) or a letter in the mail. If you do not hear about your labs in the next 2 weeks, please call the office.   You should return to our clinic No follow-ups on file.  I recommend that you always bring your medications to each appointment as this makes it easy to ensure you are on the correct medications and helps [place] not miss refills when you need them.  Please arrive 15 minutes before your appointment to ensure smooth check in process.  We appreciate your efforts in making this happen.  Please call the clinic at [phone] if your symptoms worsen or you have any concerns.  Thank you for allowing me to participate in your care, Lockie Mola, MD 06/24/2022, 3:18 PM PGY-1, [facility]

## 2022-06-24 NOTE — Progress Notes (Signed)
    SUBJECTIVE:   CHIEF COMPLAINT / HPI:   Left foot pain For one month, inverted foot and tripped and did it again recently. Pain worsened to everyday. Says pain feels sharp like something is poking you , comes and goes throughout the day usually worse at night pain in legs. She says she sometimes has leg pain after going to grocery store which improves after she raises them up. This pain is different to that and is more focal.  She says her foot automatically inverts if she puts more pressure on it has happened 3 times.  Muscle ache cream didn't help, raises feet and didn't help  She says after she first tripped she had pain which went away and then slowly got worse.   Has never sprained that ankle before   PERTINENT  PMH / PSH: has not previously sprained ankle, no connective tissue disease, or broken bones   OBJECTIVE:   BP 106/62   Pulse 77   Wt (!) 303 lb (137.4 kg)   SpO2 97%   BMI 48.91 kg/m   General: well appearing, obese woman  CV: RRR, no M/R/G Resp: CTAB Abd: soft, non tender, non distended  Ext: no edema, no warmth  L foot and ankle: no pain over lateral maleolus tip or posterior to maleolus, no pain to foot on palpation. No pain on dorsiflexion, plantar flexion, inversion or eversion, tingling with dorsiflexion, can walk normally  Gait normal    ASSESSMENT/PLAN:   Acute left ankle pain Most likely subacute sprain given timeline. Do not expect broken bone as no pain on palpation or with any movements, normal gait. Ottowa is negative without recommendation for xray.  - Recommend icing intermittently  - Voltaren gel if needed  - Ankle strengthening exercises demonstrated and materials given  - F/u if worsens   Prediabetes A1c 5.7, 5.6. Has not had A1c in 11 months. No lifestyle changes or new medications.  - A1c today and follow up to discuss diabetes prevention      Lockie Mola, MD [name]

## 2022-06-30 ENCOUNTER — Ambulatory Visit: Payer: Medicaid Other | Admitting: Gastroenterology

## 2022-07-01 DIAGNOSIS — F4311 Post-traumatic stress disorder, acute: Secondary | ICD-10-CM | POA: Diagnosis not present

## 2022-07-06 DIAGNOSIS — F4311 Post-traumatic stress disorder, acute: Secondary | ICD-10-CM | POA: Diagnosis not present

## 2022-07-29 DIAGNOSIS — F4311 Post-traumatic stress disorder, acute: Secondary | ICD-10-CM | POA: Diagnosis not present

## 2022-08-03 ENCOUNTER — Ambulatory Visit: Payer: Medicaid Other | Admitting: Family Medicine

## 2022-08-03 ENCOUNTER — Telehealth: Payer: Self-pay

## 2022-08-03 ENCOUNTER — Encounter: Payer: Self-pay | Admitting: Family Medicine

## 2022-08-03 VITALS — BP 130/74 | HR 74 | Ht 66.0 in | Wt 304.2 lb

## 2022-08-03 DIAGNOSIS — J029 Acute pharyngitis, unspecified: Secondary | ICD-10-CM | POA: Diagnosis not present

## 2022-08-03 DIAGNOSIS — N644 Mastodynia: Secondary | ICD-10-CM | POA: Diagnosis not present

## 2022-08-03 DIAGNOSIS — J069 Acute upper respiratory infection, unspecified: Secondary | ICD-10-CM | POA: Diagnosis not present

## 2022-08-03 MED ORDER — IBUPROFEN 400 MG PO TABS: 800.0000 mg | ORAL_TABLET | Freq: Three times a day (TID) | ORAL | 0 refills | Status: DC | PRN

## 2022-08-03 NOTE — Patient Instructions (Addendum)
It was nice seeing you today!  Take ibuprofen and/or Tylenol for headaches, sore throat, and body aches.  Try using coconut oil on your nipples.  Come back and see [place] if not getting better.  Stay well, Zola Button, MD [place] [phone]  --  Make sure to check out at the front desk before you leave today.  Please arrive at least 15 minutes prior to your scheduled appointments.  If you had blood work today, I will send you a MyChart message or a letter if results are normal. Otherwise, I will give you a call.  If you had a referral placed, they will call you to set up an appointment. Please give [place] a call if you don't hear back in the next 2 weeks.  If you need additional refills before your next appointment, please call your pharmacy first.

## 2022-08-03 NOTE — Progress Notes (Signed)
    SUBJECTIVE:   CHIEF COMPLAINT / HPI:  Chief Complaint  Patient presents with   Sore Throat    Body aches 2-3 day, headache    Reports sore throat, headache, body aches, cough productive started 2-3 days ago. Has not taken any medications. Denies fever. Sick contact at church. Not sexually active. Wants to be tested for Covid, flu and strep throat.  Also reports bilateral nipple pain/discomfort for 1 week. Feels similar to pain she had when she was breastfeeding.  PERTINENT  PMH / PSH: HS  Patient Care Team: Alen Bleacher, MD as PCP - General (Family Medicine) Adamo, Hattie Perch, MD (Family Medicine) Kennith Center, RD as Dietitian (Family Medicine) Adamo, Hattie Perch, MD (Family Medicine) Alda Berthold, DO as Consulting Physician (Neurology)   OBJECTIVE:   BP 130/74   Pulse 74   Ht 5\' 6"  (1.676 m)   Wt (!) 304 lb 3.2 oz (138 kg)   LMP  (LMP Unknown) Comment: Last month, sometime spotting  SpO2 99%   BMI 49.10 kg/m   Physical Exam Constitutional:      General: She is not in acute distress. HENT:     Head: Normocephalic and atraumatic.     Mouth/Throat:     Mouth: Mucous membranes are moist.     Pharynx: No oropharyngeal exudate or posterior oropharyngeal erythema.     Comments: Aphthous ulcer noted on soft palate Cardiovascular:     Rate and Rhythm: Normal rate and regular rhythm.  Pulmonary:     Effort: Pulmonary effort is normal. No respiratory distress.     Breath sounds: Normal breath sounds.  Musculoskeletal:     Cervical back: Neck supple.  Lymphadenopathy:     Cervical: No cervical adenopathy.  Neurological:     Mental Status: She is alert.         08/03/2022    1:34 PM  Depression screen PHQ 2/9  Decreased Interest 0  Down, Depressed, Hopeless 0  PHQ - 2 Score 0  Altered sleeping 0  Tired, decreased energy 0  Change in appetite 0  Feeling bad or failure about yourself  0  Trouble concentrating 0  Moving slowly or fidgety/restless 0  Suicidal  thoughts 0  PHQ-9 Score 0     {Show previous vital signs (optional):23777}    ASSESSMENT/PLAN:   Viral URI Viral URI prodrome for 2 days. Centor score 0, would not recommend strep testing. - Covid/flu tests  - ibuprofen rx provided per patient request - supportive care  Nipple discomfort Possibly hormonal/menstrual related? - recommended coconut oil - return if not improving  Return if symptoms worsen or fail to improve.   Zola Button, MD [place]

## 2022-08-03 NOTE — Telephone Encounter (Signed)
Patient calls nurse line reporting flu like symptoms.   Patient reports body aches, cough and sore throat for ~ 2 days. Patient denies any fevers or congestion. Patient does report a sick child in the home.   Patient is unable to test for covid.   Patient scheduled for this afternoon for evaluation.

## 2022-08-05 LAB — COVID-19, FLU A+B NAA
Influenza A, NAA: NOT DETECTED
Influenza B, NAA: NOT DETECTED
SARS-CoV-2, NAA: NOT DETECTED

## 2022-08-09 DIAGNOSIS — G4733 Obstructive sleep apnea (adult) (pediatric): Secondary | ICD-10-CM | POA: Diagnosis not present

## 2022-08-09 DIAGNOSIS — F4311 Post-traumatic stress disorder, acute: Secondary | ICD-10-CM | POA: Diagnosis not present

## 2022-08-12 DIAGNOSIS — F4311 Post-traumatic stress disorder, acute: Secondary | ICD-10-CM | POA: Diagnosis not present

## 2022-08-18 NOTE — Congregational Nurse Program (Signed)
  Dept: [phone]   Congregational Nurse Program Note  Date of Encounter: 08/18/2022 BP 128/82   Pulse 81   LMP  (LMP Unknown) Comment: Last month, sometime spotting Pt at [facility] requesting information about high bp and diabetic risk. Pt education provided Estanislado Pandy, RN, CCNP   Past Medical History: Past Medical History:  Diagnosis Date   Acne vulgaris    Cough 11/17/2021   COVID-19 12/11/2020   Dizziness 75/11/256   Folliculitis 52/77/8242   GERD (gastroesophageal reflux disease)    Helicobacter pylori gastritis    Hidradenitis suppurativa    Neuropathy    BUE   Pharyngitis 11/13/2019   Pre-diabetes    Seasonal allergies    Sleep apnea    Sleep deprivation 09/28/2018   Snoring 01/22/2020   Sore throat 11/13/2019    Encounter Details:  CNP Questionnaire - 08/18/22 1721       Questionnaire   Ask client: Do you give verbal consent for me to treat you today? Yes    Student Assistance N/A    Location Patient [place]    Visit Setting with Client Organization    Patient Status Unknown    Sport and exercise psychologist or [place]    Insurance/Financial Assistance Referral N/A    Medical Provider Yes    Medical Referrals Made N/A    Medical Appointment Made N/A    Food N/A    Transportation N/A    Housing/Utilities N/A    Interpersonal Safety N/A    Interventions Advocate/Support;Navigate Healthcare System;Educate;Counsel    Screenings CN Performed Blood Pressure    Did client attend any of the following based off CNs referral or appointments made? N/A    ED Visit Averted N/A    Life-Saving Intervention Made N/A

## 2022-08-24 ENCOUNTER — Ambulatory Visit (INDEPENDENT_AMBULATORY_CARE_PROVIDER_SITE_OTHER): Payer: Medicaid Other

## 2022-08-24 DIAGNOSIS — Z23 Encounter for immunization: Secondary | ICD-10-CM

## 2022-08-25 DIAGNOSIS — Z23 Encounter for immunization: Secondary | ICD-10-CM | POA: Diagnosis not present

## 2022-08-31 DIAGNOSIS — F4311 Post-traumatic stress disorder, acute: Secondary | ICD-10-CM | POA: Diagnosis not present

## 2022-09-06 DIAGNOSIS — F4311 Post-traumatic stress disorder, acute: Secondary | ICD-10-CM | POA: Diagnosis not present

## 2022-09-07 ENCOUNTER — Ambulatory Visit: Payer: Medicaid Other | Admitting: Family Medicine

## 2022-09-13 DIAGNOSIS — F4311 Post-traumatic stress disorder, acute: Secondary | ICD-10-CM | POA: Diagnosis not present

## 2022-09-13 DIAGNOSIS — M79641 Pain in right hand: Secondary | ICD-10-CM | POA: Diagnosis not present

## 2022-09-13 DIAGNOSIS — M79642 Pain in left hand: Secondary | ICD-10-CM | POA: Diagnosis not present

## 2022-09-20 DIAGNOSIS — F4311 Post-traumatic stress disorder, acute: Secondary | ICD-10-CM | POA: Diagnosis not present

## 2022-09-22 DIAGNOSIS — F4311 Post-traumatic stress disorder, acute: Secondary | ICD-10-CM | POA: Diagnosis not present

## 2022-09-30 DIAGNOSIS — F4311 Post-traumatic stress disorder, acute: Secondary | ICD-10-CM | POA: Diagnosis not present

## 2022-10-05 ENCOUNTER — Telehealth (INDEPENDENT_AMBULATORY_CARE_PROVIDER_SITE_OTHER): Payer: Medicaid Other | Admitting: Family Medicine

## 2022-10-05 ENCOUNTER — Encounter: Payer: Self-pay | Admitting: Family Medicine

## 2022-10-05 ENCOUNTER — Telehealth: Payer: Self-pay | Admitting: Family Medicine

## 2022-10-05 DIAGNOSIS — G4733 Obstructive sleep apnea (adult) (pediatric): Secondary | ICD-10-CM

## 2022-10-05 NOTE — Progress Notes (Signed)
PATIENT: Lauren Obrien DOB: [birth year]  REASON FOR VISIT: follow up HISTORY FROM: patient  Virtual Visit via Telephone Note  I connected with Lauren Obrien on 10/05/22 at  3:00 PM EST by telephone and verified that I am speaking with the correct person using two identifiers.   I discussed the limitations, risks, security and privacy concerns of performing an evaluation and management service by telephone and the availability of in person appointments. I also discussed with the patient that there may be a patient responsible charge related to this service. The patient expressed understanding and agreed to proceed.   History of Present Illness:  10/05/22 ALL ([place]):  Lauren Obrien returns for follow up for OSA on CPAP. She was last seen 10/2021 and having difficulty meeting four hour compliance goals. Since, she reports doing fairly well. She continues to struggle meeting compliance and contributes this to allergies and being in school. She doesn't get 4 hours of sleep every night. She was on allergy medication but ran out and has not refilled. She is changing supplies regularly. She has been more tired, recently.   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 She has a son.     11/05/2021 ALL: Lauren Obrien is a 21 y.o. female here today for follow up for OSA on CPAP. She is doing fairly well. She continues CPAP. She admits that she has not ordered new supplies in several months. She is overdue to follow up and needed new rx for supplies. She usually wakes at some point in the night and removes the mask. She has seasonal allergies and noted occasional burning of her nares.     HISTORY: (copied from Dr Dohmeier's previous note)  Lauren Obrien is a 21 y.o. year old Hispanic  female patient seen here as a referral on 06/25/2020 . She is a primary patient of Dr. Marjory Lies.    Lauren Obrien is here today to follow-up on her to sleep  studies at the beginning of her CPAP therapy she was first diagnosed with obstructive sleep apnea at a mild degree at present strong REM sleep dependence on 01-09-2020 CPAP titration followed on 5-10 21 she was fitted with a ResMed nasal cushion mask and small size.  She did well between 5 and 9 cm water pressure and was given an auto titration machine with heated humidity with a setting from 5-12 with 1 cm EPR.  She remained on the same nasal cushion mask.  In the meantime she has developed trouble with the dryness of the air but also with the feeling that there is too much pressure sometimes leaking out of the mouth.  She has changed the mask recently.  She has a 97% compliance for days and 85% compliance 4 hours with an average of 6 hours and 12 minutes, the settings have remained between 5 and 12 cmH2O now with 3 cm EPR and her AHI is 4.1 which is a good resolution of apnea she does not have any central apneas arising her residual apneas still are obstructive in nature the air leakage is above average the 95th percentile pressure is 11.6.  There have been days with very high apnea indices and other days where there is almost none.  Air leak and higher apnea index seem to correlate.   She sometimes lost the mask at night . No longer feeling congested. Nasal drip continues. PCP has prescribed nasal spray Weight loss has not  been addressed.   Needs a weight loss program.  Has an IUD. Hirsutism, possible PCOS.    Initial Consultation: Chief concern according to patient : possible sleep disorder.  She  has a past medical history of Acne vulgaris, GERD (gastroesophageal reflux disease), Helicobacter pylori gastritis, Hidradenitis suppurativa, Neuropathy, and Pharyngitis (11/13/2019).  The patient is morbidly obese- BMI 47 kg/m2.  Her headaches started 4 years ago, daily migraines at the time. It improved for a while- and returned when under stress, when under test situations. She described the headaches as  arising from the temple, pressure, retro-orbital.  Rarely sharp sensation. Sometimes nauseated- but not related to headaches.  No olfactory triggers, but photophobia, phonophobia.   Family medical /sleep history: brother has OSA, untreated.    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 currently works part timeArt gallery manager. Pets are not present. Tobacco use; never - but father is a smoker-.  ETOH use : none , Caffeine intake in form of Coffee( none) Soda( 3 a week) Tea (none  ) or energy drinks..     Sleep habits are as follows: The patient's dinner time is between 5 PM. The patient goes to bed at 11-12 PM and continues to sleep for 6-8 hours,  Is restless, wakes rarely for1 bathroom break. She averages 6-7 hours of sleep at night.   The preferred sleep position is on her side, with the support of 1 pillow.  Dreams are reportedly present most night.   7.30-8 AM is the usual rise time. The patient wakes up 6.50 with an alarm.   She reports not feeling refreshed or restored in AM, with symptoms such as dry mouth, feeling tired, rarely morning headaches and residual fatigue.  Naps are taken infrequently.  Observations/Objective:  Generalized: Well developed, in no acute distress  Mentation: Alert oriented to time, place, history taking. Follows all commands speech and language fluent   Assessment and Plan:  21 y.o. year old female  has a past medical history of Acne vulgaris, Cough (11/17/2021), COVID-19 (12/11/2020), Dizziness (08/23/2019), Folliculitis (08/29/2019), GERD (gastroesophageal reflux disease), Helicobacter pylori gastritis, Hidradenitis suppurativa, Neuropathy, Pharyngitis (11/13/2019), Pre-diabetes, Seasonal allergies, Sleep apnea, Sleep deprivation (09/28/2018), Snoring (01/22/2020), and Sore throat (11/13/2019). here with    ICD-10-CM   1. OSA on CPAP  G47.33 For home use only DME  continuous positive airway pressure (CPAP)     Lauren Obrien continues to have difficulty meeting compliance goals. I have encouraged her to follow up with PCP regarding allergies. Sleep hygiene reviewed. Current 90 day report shows 71% daily and 43% 4 hour compliance. I have encouraged her to focus on using CPAp nightly for at least 4 hours. We will update supplies. Healthy lifestyle habits encouraged. Follow up in 6 months.    Orders Placed This Encounter  Procedures   For home use only DME continuous positive airway pressure (CPAP)    Supplies    Order Specific Question:   Length of Need    Answer:   Lifetime    Order Specific Question:   Patient has OSA or probable OSA    Answer:   Yes    Order Specific Question:   Is the patient currently using CPAP in the home    Answer:   Yes    Order Specific Question:   Settings    Answer:   Other see comments  Order Specific Question:   CPAP supplies needed    Answer:   Mask, headgear, cushions, filters, heated tubing and water chamber    No orders of the defined types were placed in this encounter.    Follow Up Instructions:  I discussed the assessment and treatment plan with the patient. The patient was provided an opportunity to ask questions and all were answered. The patient agreed with the plan and demonstrated an understanding of the instructions.   The patient was advised to call back or seek an in-person evaluation if the symptoms worsen or if the condition fails to improve as anticipated.  I provided 15 minutes of non-face-to-face time during this encounter. Patient located at their place of residence during Mychart visit. Provider is in the office.    Shawnie Dapper, NP

## 2022-10-05 NOTE — Telephone Encounter (Signed)
..   Pt understands that although there may be some limitations with this type of visit, we will take all precautions to reduce any security or privacy concerns.  Pt understands that this will be treated like an in office visit and we will file with pt's insurance, and there may be a patient responsible charge related to this service. ? ?

## 2022-10-05 NOTE — Patient Instructions (Addendum)
Please continue using your CPAP regularly. While your insurance requires that you use CPAP at least 4 hours each night on 70% of the nights, I recommend, that you not skip any nights and use it throughout the night if you can. Getting used to CPAP and staying with the treatment long term does take time and patience and discipline. Untreated obstructive sleep apnea when it is moderate to severe can have an adverse impact on cardiovascular health and raise her risk for heart disease, arrhythmias, hypertension, congestive heart failure, stroke and diabetes. Untreated obstructive sleep apnea causes sleep disruption, nonrestorative sleep, and sleep deprivation. This can have an impact on your day to day functioning and cause daytime sleepiness and impairment of cognitive function, memory loss, mood disturbance, and problems focussing. Using CPAP regularly can improve these symptoms.  Follow up in 6 months.  

## 2022-10-06 ENCOUNTER — Telehealth: Payer: Self-pay

## 2022-10-12 ENCOUNTER — Ambulatory Visit: Payer: Medicaid Other | Admitting: Family Medicine

## 2022-10-14 DIAGNOSIS — F4311 Post-traumatic stress disorder, acute: Secondary | ICD-10-CM | POA: Diagnosis not present

## 2022-10-15 ENCOUNTER — Ambulatory Visit: Payer: Medicaid Other | Admitting: Student

## 2022-10-15 VITALS — BP 110/82 | HR 88 | Ht 66.0 in | Wt 273.2 lb

## 2022-10-15 DIAGNOSIS — J069 Acute upper respiratory infection, unspecified: Secondary | ICD-10-CM

## 2022-10-15 DIAGNOSIS — G9331 Postviral fatigue syndrome: Secondary | ICD-10-CM

## 2022-10-15 MED ORDER — CETIRIZINE HCL 10 MG PO TABS: 10.0000 mg | ORAL_TABLET | Freq: Every day | ORAL | 11 refills | Status: DC | PRN

## 2022-10-15 MED ORDER — BENZONATATE 100 MG PO CAPS: 100.0000 mg | ORAL_CAPSULE | Freq: Two times a day (BID) | ORAL | 0 refills | Status: DC | PRN

## 2022-10-15 MED ORDER — FLUTICASONE PROPIONATE 50 MCG/ACT NA SUSP: NASAL | 0 refills | Status: DC

## 2022-10-15 NOTE — Patient Instructions (Addendum)
It was wonderful to meet you today. Thank you for allowing me to be a part of your care. Below is a short summary of what we discussed at your visit today:  Cough is likely due to a postviral syndrome.  I have placed an order for Tessalon  which you will take 2 times daily for a week.  I also recommend use of warm water, honey and lemon.  I have refilled your Flonase and Zyrtec for allergy  Please bring all of your medications to every appointment!  If you have any questions or concerns, please do not hesitate to contact us via phone or MyChart message.   Jerre Simon, MD [facility]

## 2022-10-15 NOTE — Progress Notes (Signed)
    SUBJECTIVE:   CHIEF COMPLAINT / HPI:   Patient is a 21 year old female presenting today for cough. Reports cough has been going on for 3 weeks Pt reports throat itchiness and she got a migraine from contious coughing Mostly dry cough with intermittent clear phlem Reported recent URI symptoms about 4 weeks ago. Denies any chest pain, fevers or shortness of breath.   PERTINENT  PMH / PSH: Reviewed  OBJECTIVE:   BP 110/82   Pulse 88   Wt 273 lb 3.2 oz (123.9 kg)   SpO2 98%   BMI 44.10 kg/m    Physical Exam General: Alert, well appearing, NAD HEENT: Mild oropharyngeal erythema, no tonsillar exudates. Cardiovascular: RRR, No Murmurs, Normal S2/S2 Respiratory: CTAB, No wheezing or Rales Abdomen: No distension or tenderness   ASSESSMENT/PLAN:   Postviral symptoms Patient presenting with cough for 3 weeks after recent viral illness.  It is nonproductive and has remained afebrile.  On exam has mild oropharyngeal erythema with clear breath sounds bilaterally.  Overall symptoms consistent with postviral cough. -Encourage patient to use warm water, honey and lemon -Reviewed allergy medications Flonase and Zyrtec. -Discussed return precautions with patient.     Jerre Simon, MD [facility]

## 2022-10-21 DIAGNOSIS — F4311 Post-traumatic stress disorder, acute: Secondary | ICD-10-CM | POA: Diagnosis not present

## 2022-10-25 ENCOUNTER — Encounter: Payer: Self-pay | Admitting: Student

## 2022-10-25 ENCOUNTER — Ambulatory Visit (INDEPENDENT_AMBULATORY_CARE_PROVIDER_SITE_OTHER): Payer: Medicaid Other | Admitting: Student

## 2022-10-25 VITALS — BP 112/78 | HR 75 | Ht 66.0 in | Wt 280.6 lb

## 2022-10-25 DIAGNOSIS — Z30431 Encounter for routine checking of intrauterine contraceptive device: Secondary | ICD-10-CM | POA: Diagnosis not present

## 2022-10-25 MED ORDER — DOCUSATE SODIUM 50 MG PO CAPS: 50.0000 mg | ORAL_CAPSULE | Freq: Every day | ORAL | 0 refills | Status: DC

## 2022-10-25 NOTE — Patient Instructions (Addendum)
It was great seeing you today.  Take docusate once daily. Drink plenty of water. Eat fresh fruits and veggies.  I will see you on January 3rd at 3:10 PM.  If you have any questions or concerns, please feel free to call the clinic.   Have a wonderful day,  Dr. Darral Dash [facility] [phone]

## 2022-10-25 NOTE — Assessment & Plan Note (Signed)
We extensively discussed risks and benefits all of the various contraceptive management options including IUD, implant, OCP, Depo. Patient is afraid of needles and would not like the Depo.  She does not think she been very good at remembering to take a pill every day. Also, she likes that the hormone is localized with IUD. We came to the decision that we will not remove her IUD today, she will return in 3 weeks to see me for removal and/or reinsertion of new IUD Christean Grief, [place]) as she has some cramping with the Mirena, for a smaller IUD. She will send me a message or contact me in the next week to let me know if she would like to have another IUD inserted.

## 2022-10-25 NOTE — Progress Notes (Signed)
    SUBJECTIVE:   CHIEF COMPLAINT / HPI:   Lauren Obrien is a 21 year old female here to discuss IUD removal.  She is not interested in conceiving in the near future.  She would desire another child, but at least not in the next couple years.  She has enjoyed having her Mirena IUD and cessation of her menses.  She has had it since 2017.  Wanted to discuss risk and benefits of all different contraceptive management options.  PERTINENT  PMH / PSH: Reviewed  OBJECTIVE:   BP 112/78   Pulse 75   Ht 5\' 6"  (1.676 m)   Wt 280 lb 9.6 oz (127.3 kg)   SpO2 98%   BMI 45.29 kg/m  General: Well-appearing, no distress, very pleasant Respiratory: Normal work of breathing on room air Skin, warm and dry   ASSESSMENT/PLAN:   Contraceptive management We extensively discussed risks and benefits all of the various contraceptive management options including IUD, implant, OCP, Depo. Patient is afraid of needles and would not like the Depo.  She does not think she been very good at remembering to take a pill every day. Also, she likes that the hormone is localized with IUD. We came to the decision that we will not remove her IUD today, she will return in 3 weeks to see me for removal and/or reinsertion of new IUD , [name]) as she has some cramping with the Mirena, for a smaller IUD. She will send me a message or contact me in the next week to let me know if she would like to have another IUD inserted.     Sleepy eye, DO [facility]

## 2022-10-26 DIAGNOSIS — F4311 Post-traumatic stress disorder, acute: Secondary | ICD-10-CM | POA: Diagnosis not present

## 2022-11-04 DIAGNOSIS — F4311 Post-traumatic stress disorder, acute: Secondary | ICD-10-CM | POA: Diagnosis not present

## 2022-11-17 ENCOUNTER — Other Ambulatory Visit (HOSPITAL_COMMUNITY)
Admission: RE | Admit: 2022-11-17 | Discharge: 2022-11-17 | Disposition: A | Discharge status: Home / Self Care | Payer: Medicaid Other | Source: Ambulatory Visit | Attending: Family Medicine | Admitting: Family Medicine

## 2022-11-17 ENCOUNTER — Encounter: Payer: Self-pay | Admitting: Student

## 2022-11-17 ENCOUNTER — Ambulatory Visit (INDEPENDENT_AMBULATORY_CARE_PROVIDER_SITE_OTHER): Payer: Medicaid Other | Admitting: Student

## 2022-11-17 VITALS — BP 122/78 | HR 84 | Ht 66.0 in | Wt 284.0 lb

## 2022-11-17 DIAGNOSIS — Z538 Procedure and treatment not carried out for other reasons: Secondary | ICD-10-CM | POA: Diagnosis not present

## 2022-11-17 DIAGNOSIS — Z975 Presence of (intrauterine) contraceptive device: Secondary | ICD-10-CM | POA: Diagnosis not present

## 2022-11-17 DIAGNOSIS — Z30432 Encounter for removal of intrauterine contraceptive device: Secondary | ICD-10-CM

## 2022-11-17 DIAGNOSIS — Z113 Encounter for screening for infections with a predominantly sexual mode of transmission: Secondary | ICD-10-CM | POA: Diagnosis not present

## 2022-11-17 LAB — POCT WET PREP (WET MOUNT)
Clue Cells Wet Prep Whiff POC: POSITIVE
Trichomonas Wet Prep HPF POC: ABSENT

## 2022-11-17 NOTE — Patient Instructions (Addendum)
It was great seeing you today.  Please go get your pelvic ultrasound on Monday January 8th at 4:00 pm at [facility] to check to see that your IUD is in place. Make sure you have a full bladder. I also placed a referral to OB/GYN for removal.   If you have any questions or concerns, please feel free to call the clinic.   Have a wonderful day,  Dr. Orvis Brill [facility] [phone]

## 2022-11-17 NOTE — Progress Notes (Signed)
    SUBJECTIVE:   CHIEF COMPLAINT / HPI:   Nick is a 22 year old female here to have IUD removal given that Mirena is at its expiration date.  We extensively discussed contraceptive management options at her last visit, and she was trying to decide if she wanted to have another IUD removed.  Today, she tells me that she does not want to have another IUD inserted.  She also is not interested in any other form of contraceptive.  She says that she does not plan on being sexually active.  At the end of her visit, she requested STI testing given increase in vaginal discharge.  She was sexually active many months ago.  Denies any current sexual activity.  PERTINENT  PMH / PSH: Reviewed  OBJECTIVE:   BP 122/78   Pulse 84   Ht 5\' 6"  (1.676 m)   Wt 284 lb (128.8 kg)   SpO2 98%   BMI 45.84 kg/m   General: Well-appearing, no distress CV: Regular rate and rhythm Respiratory: Normal work of breathing on room air GU: Chaperone Jessica, CMA present for exam.  External vulva and vagina nonerythematous, without any obvious lesions or rash.  Moderate amount of watery, blood tinted discharge appreciated.  Normal ruggae of vaginal walls.  Cervix is non erythematous and non-friable.  IUD strings not visualized.  There is no cervical motion tenderness, masses or gross abnormalities appreciated during bimanual exam.     ASSESSMENT/PLAN:   Attempted IUD removal, unsuccessful I was able to fully visualize the cervix on exam using speculum. Unfortunately, I was unable to visualize IUD strings. -Ordered pelvic ultrasound to ensure that IUD is still in place -Also sent referral to OB/GYN for IUD removal  Routine screening for STI (sexually transmitted infection) Patient with pretty significant amount of watery discharge on exam Obtained wet prep, gonorrhea/chlamydia, trichomonas.  Also obtain HIV and RPR Plan follow-up on the results and treat as appropriate Encourage patient to engage in safe sexual  intercourse with condoms Patient is to let me know if she changes her mind on desiring any form of contraception      Orvis Brill, [place]

## 2022-11-17 NOTE — Assessment & Plan Note (Signed)
I was able to fully visualize the cervix on exam using speculum. Unfortunately, I was unable to visualize IUD strings. -Ordered pelvic ultrasound to ensure that IUD is still in place -Also sent referral to OB/GYN for IUD removal

## 2022-11-17 NOTE — Assessment & Plan Note (Signed)
Patient with pretty significant amount of watery discharge on exam Obtained wet prep, gonorrhea/chlamydia, trichomonas.  Also obtain HIV and RPR Plan follow-up on the results and treat as appropriate Encourage patient to engage in safe sexual intercourse with condoms Patient is to let me know if she changes her mind on desiring any form of contraception

## 2022-11-18 ENCOUNTER — Telehealth: Payer: Self-pay | Admitting: Student

## 2022-11-18 DIAGNOSIS — F4311 Post-traumatic stress disorder, acute: Secondary | ICD-10-CM | POA: Diagnosis not present

## 2022-11-18 LAB — HIV ANTIBODY (ROUTINE TESTING W REFLEX): HIV Screen 4th Generation wRfx: NONREACTIVE

## 2022-11-18 LAB — RPR: RPR Ser Ql: NONREACTIVE

## 2022-11-18 MED ORDER — METRONIDAZOLE 500 MG PO TABS: 500.0000 mg | ORAL_TABLET | Freq: Two times a day (BID) | ORAL | 0 refills | Status: AC

## 2022-11-18 NOTE — Telephone Encounter (Signed)
MyChart message sent to patient.

## 2022-11-19 LAB — CERVICOVAGINAL ANCILLARY ONLY
Chlamydia: NEGATIVE
Comment: NEGATIVE
Comment: NEGATIVE
Comment: NORMAL
Neisseria Gonorrhea: NEGATIVE
Trichomonas: NEGATIVE

## 2022-11-22 ENCOUNTER — Ambulatory Visit (HOSPITAL_COMMUNITY)
Admission: RE | Admit: 2022-11-22 | Discharge: 2022-11-22 | Disposition: A | Discharge status: Home / Self Care | Payer: Medicaid Other | Source: Ambulatory Visit | Attending: Family Medicine | Admitting: Family Medicine

## 2022-11-22 DIAGNOSIS — Z538 Procedure and treatment not carried out for other reasons: Secondary | ICD-10-CM | POA: Diagnosis not present

## 2022-11-22 DIAGNOSIS — Z975 Presence of (intrauterine) contraceptive device: Secondary | ICD-10-CM | POA: Diagnosis not present

## 2022-11-22 DIAGNOSIS — Z30431 Encounter for routine checking of intrauterine contraceptive device: Secondary | ICD-10-CM | POA: Diagnosis not present

## 2022-11-29 DIAGNOSIS — M79641 Pain in right hand: Secondary | ICD-10-CM | POA: Diagnosis not present

## 2022-11-29 DIAGNOSIS — M79642 Pain in left hand: Secondary | ICD-10-CM | POA: Diagnosis not present

## 2022-11-29 DIAGNOSIS — M25531 Pain in right wrist: Secondary | ICD-10-CM | POA: Diagnosis not present

## 2022-11-29 DIAGNOSIS — M25532 Pain in left wrist: Secondary | ICD-10-CM | POA: Diagnosis not present

## 2022-12-01 DIAGNOSIS — F4311 Post-traumatic stress disorder, acute: Secondary | ICD-10-CM | POA: Diagnosis not present

## 2022-12-07 DIAGNOSIS — F4311 Post-traumatic stress disorder, acute: Secondary | ICD-10-CM | POA: Diagnosis not present

## 2022-12-08 DIAGNOSIS — G4733 Obstructive sleep apnea (adult) (pediatric): Secondary | ICD-10-CM | POA: Diagnosis not present

## 2022-12-14 ENCOUNTER — Ambulatory Visit: Payer: Medicaid Other | Admitting: Family Medicine

## 2022-12-14 NOTE — Patient Instructions (Incomplete)
It was nice seeing you today!  Blood work today.  See me in 3 months or whenever is a good for you.  Stay well, Drea Jurewicz, MD [facility] [phone]  --  Make sure to check out at the front desk before you leave today.  Please arrive at least 15 minutes prior to your scheduled appointments.  If you had blood work today, I will send you a MyChart message or a letter if results are normal. Otherwise, I will give you a call.  If you had a referral placed, they will call you to set up an appointment. Please give us a call if you don't hear back in the next 2 weeks.  If you need additional refills before your next appointment, please call your pharmacy first.  

## 2022-12-14 NOTE — Progress Notes (Deleted)
    SUBJECTIVE:   CHIEF COMPLAINT / HPI:  No chief complaint on file.   ***  PERTINENT  PMH / PSH:   Patient Care Team: Lowry Ram, MD as PCP - General (Family Medicine) Adamo, Hattie Perch, MD (Family Medicine) Kennith Center, RD as Dietitian (Family Medicine) Adamo, Hattie Perch, MD (Family Medicine) Alda Berthold, DO as Consulting Physician (Neurology)   OBJECTIVE:   There were no vitals taken for this visit.  Physical Exam      11/17/2022    3:35 PM  Depression screen PHQ 2/9  Decreased Interest 0  Down, Depressed, Hopeless 0  PHQ - 2 Score 0  Altered sleeping 0  Tired, decreased energy 0  Change in appetite 0  Feeling bad or failure about yourself  0  Trouble concentrating 0  Moving slowly or fidgety/restless 0  Suicidal thoughts 0  PHQ-9 Score 0     {Show previous vital signs (optional):23777}  {Labs  Heme  Chem  Endocrine  Serology  Results Review (optional):23779}  ASSESSMENT/PLAN:   No problem-specific Assessment & Plan notes found for this encounter.    No follow-ups on file.   Zola Button, MD [place]

## 2022-12-15 ENCOUNTER — Ambulatory Visit: Payer: Medicaid Other | Admitting: Student

## 2022-12-15 ENCOUNTER — Ambulatory Visit: Payer: Self-pay

## 2022-12-15 VITALS — BP 104/68 | HR 101 | Temp 100.8°F | Wt 278.8 lb

## 2022-12-15 DIAGNOSIS — R509 Fever, unspecified: Secondary | ICD-10-CM

## 2022-12-15 DIAGNOSIS — J020 Streptococcal pharyngitis: Secondary | ICD-10-CM | POA: Diagnosis not present

## 2022-12-15 DIAGNOSIS — J029 Acute pharyngitis, unspecified: Secondary | ICD-10-CM | POA: Diagnosis not present

## 2022-12-15 LAB — POC SOFIA 2 FLU + SARS ANTIGEN FIA
Influenza A, POC: NEGATIVE
Influenza B, POC: NEGATIVE
SARS Coronavirus 2 Ag: NEGATIVE

## 2022-12-15 LAB — POCT RAPID STREP A (OFFICE): Rapid Strep A Screen: POSITIVE — AB

## 2022-12-15 MED ORDER — IBUPROFEN 400 MG PO TABS: 400.0000 mg | ORAL_TABLET | Freq: Three times a day (TID) | ORAL | 0 refills | Status: DC | PRN

## 2022-12-15 MED ORDER — AMOXICILLIN 500 MG PO TABS: 500.0000 mg | ORAL_TABLET | Freq: Two times a day (BID) | ORAL | 0 refills | Status: AC

## 2022-12-15 NOTE — Patient Instructions (Signed)
It was wonderful to meet you today. Thank you for allowing me to be a part of your care. Below is a short summary of what we discussed at your visit today:  Your strep test was positive which showed that you have strep pharyngitis.  I have sent in prescription for antibiotics.  Amoxicillin which you will take 2 times daily for 10 days.  You can also do Tylenol or ibuprofen for pain.  Please make sure you stay hydrated.  Please bring all of your medications to every appointment!  If you have any questions or concerns, please do not hesitate to contact us via phone or MyChart message.   Alen Bleacher, MD [facility]

## 2022-12-15 NOTE — Progress Notes (Signed)
    SUBJECTIVE:   CHIEF COMPLAINT / HPI:   Patient is a 22 year old female presenting today for concerns of sore throat.  According to patient symptoms started 2 days ago.  Associated symptoms include fever, chills, headache, body ache but no cough, congestion or runny nose. Tmax was 38.9c (102F).  Patient reports she has tried anesthetic spray for the sore throat without improvement.  She has done Tylenol and ibuprofen for fever which provided some relief. No known sick contacts.    PERTINENT  PMH / PSH: Reviewed  OBJECTIVE:   BP 104/68   Pulse (!) 101   Wt 278 lb 12.8 oz (126.5 kg)   SpO2 97%   BMI 45.00 kg/m    Physical Exam General: Alert, well appearing, NAD HEENT: Oropharyngeal erythema, tonsillar exudate with left cervical lymphadenopathy. Cardiovascular: RRR, No Murmurs, Normal S2/S2 Respiratory: CTAB, No wheezing or Rales   ASSESSMENT/PLAN:   Strep pharyngitis Patient presenting symptom of fever, sore throat and headache with physical exam findings suspicious of strep pharyngitis which was confirmed by a positive rapid strep test.  Will treat patient with antibiotics and analgesics for pain. -Obtain Rapid Strep Test, Flu and Covid -Rx amoxicillin 500 mg twice daily for 10 days -Recommended Tylenol or ibuprofen for pain -Encourage use of warm water, honey and lemon - Encouraged adequate hydration -Reviewed return precautions with patient.  Alen Bleacher, MD [place]

## 2022-12-29 DIAGNOSIS — F4311 Post-traumatic stress disorder, acute: Secondary | ICD-10-CM | POA: Diagnosis not present

## 2023-01-04 DIAGNOSIS — F4311 Post-traumatic stress disorder, acute: Secondary | ICD-10-CM | POA: Diagnosis not present

## 2023-01-25 ENCOUNTER — Ambulatory Visit (INDEPENDENT_AMBULATORY_CARE_PROVIDER_SITE_OTHER): Payer: Medicaid Other | Admitting: Family Medicine

## 2023-01-25 ENCOUNTER — Encounter: Payer: Self-pay | Admitting: Family Medicine

## 2023-01-25 VITALS — BP 119/76 | HR 80 | Ht 66.0 in | Wt 292.5 lb

## 2023-01-25 DIAGNOSIS — Z30432 Encounter for removal of intrauterine contraceptive device: Secondary | ICD-10-CM | POA: Diagnosis not present

## 2023-01-25 DIAGNOSIS — H5213 Myopia, bilateral: Secondary | ICD-10-CM | POA: Diagnosis not present

## 2023-01-25 NOTE — Patient Instructions (Signed)
It was wonderful seeing you today!  I am glad you are doing well.  We removed your IUD.  You may have some spotting or bleeding for the next few days.  You can take Tylenol or ibuprofen for the discomfort.  If you do decide you want any form of birth control please see your primary care doctor or you can return here and we can provide any form of birth control needed.  If you do get pregnant please let us or your the family medicine center know for prenatal care.  If you have any questions or concerns send [place] a message or give [place] a call.  I hope you have a great afternoon!

## 2023-01-25 NOTE — Progress Notes (Signed)
    SUBJECTIVE:   CHIEF COMPLAINT / HPI:   IUD Removal  Patient presenting today for evaluation for IUD removal.  Was sent by family medicine center after they attempted to remove her IUD and were unable to locate the strings.  IUD was placed approximately 7 years ago.  Patient reports that she sometimes has cramping and thinks that it may be related to the IUD but regardless wants it out.  Patient reports that she is not wanting to get pregnant in the near future but does not want any birth control at this time.  After the strings were not visualized patient did undergo ultrasound which showed IUD was in the fundus of the uterus.  PERTINENT  PMH / PSH: IUD in place  OBJECTIVE:   BP 119/76   Pulse 80   Ht 5\' 6"  (1.676 m)   Wt 292 lb 8 oz (132.7 kg)   BMI 47.21 kg/m   General: Well-appearing 22 year old female in no acute distress Cardiac: Regular rate Respiratory: Normal for breathing, speaking in full sentences Abdomen: Soft, nontender GU: Chaperone present for entire exam, normal external female genitalia, normal internal female genitalia with no abnormal discharge, strings not visible initially MSK: No gross abnormalities  Procedure: IUD removal Consent was obtained.  Speculum was inserted and Pap smear brush inserted to try and tease the IUD strings out.  This was unsuccessful.  Cervix was then sterilized using iodine and injected with lidocaine directly into the cervix at the 12 o'clock position.  A tenaculum was placed and injections were made at the 3:00, 6:00, 9:00 positions.  A sterile Claiborne Billings was then inserted through the cervix to try and grasp the IUD without success.  An IUD hook was then inserted through the cervix and spun multiple times.  The IUD strings were teased out and then they were grasped with Claiborne Billings and the IUD was removed.  Mild amount of bleeding appreciated after the procedure.  Patient tolerated the procedure well.   ASSESSMENT/PLAN:   Encounter for IUD  removal Patient presented for removal of IUD with no visible strings.  IUD removed, see procedure note above.  Discussed birth control and patient declines any birth control at this time.  Reports that she is not trying to get pregnant.  Counseled on the use of prenatal vitamins and if she decides that she would like some form of contraceptive management she should either return here or go to her primary care doctor at the [facility] medicine center.  No questions or concerns.  Strict return precautions were given.     Concepcion Living, MD [facility]

## 2023-01-26 DIAGNOSIS — Z30432 Encounter for removal of intrauterine contraceptive device: Secondary | ICD-10-CM | POA: Insufficient documentation

## 2023-01-26 DIAGNOSIS — F4311 Post-traumatic stress disorder, acute: Secondary | ICD-10-CM | POA: Diagnosis not present

## 2023-01-26 NOTE — Assessment & Plan Note (Signed)
Patient presented for removal of IUD with no visible strings.  IUD removed, see procedure note above.  Discussed birth control and patient declines any birth control at this time.  Reports that she is not trying to get pregnant.  Counseled on the use of prenatal vitamins and if she decides that she would like some form of contraceptive management she should either return here or go to her primary care doctor at the [facility] medicine center.  No questions or concerns.  Strict return precautions were given.

## 2023-01-28 NOTE — Progress Notes (Deleted)
Follow-up Visit   Date: 01/28/23   Lauren Obrien MRN: [id number] DOB: 09-17-2001   Interim History: Lauren Obrien is a 22 y.o. right-handed female with GERD returning to the clinic for follow-up of left arm numbness.  The patient was accompanied to the clinic by self.   History of present illness: Starting in 2020, she began having left wrist pain.  She has treated it with NSAIDs, which helped some.  She then began having numbness/tingling in the left hand and arm.  It is worse when she types or washes dishes. Nothing improves her symptoms.  Over the years, symptoms have become worse and she now starts to have similar problems in the right arm.  She tried wearing a wrist brace, but it made her arm feel even weaker.  She also complains of weakness in the left arm.  She works as a Research scientist (physical sciences). She lives with parents, brother, and 83-year old son.    She has been evaluated for symptoms in 2020 by Dr. Leta Baptist at [facility] with NCS/EMG of the left arm, MRI brain, and MRI cervical spine which has been normal. She has not done PT.   UPDATE 07/10/2021:  At her last visit, I reviewed her prior work-up which was normal and suggested that she try physical therapy.  She has been doing 6 weeks of physical therapy and feels that that the numbness is less intense, but occurs continues to occur daily.  She continues to have right hand stiffness, especially after starting school.  Sometimes, when she is sitting for longer periods of time at her desk her arm will go numb.  She complains of spinning and lightheadedness which has been going on for sometime.    Medications:  Current Outpatient Medications on File Prior to Visit  Medication Sig Dispense Refill   cetirizine (ZYRTEC) 10 MG tablet Take 1 tablet (10 mg total) by mouth daily as needed for allergies. 30 tablet 11   docusate sodium (COLACE) 50 MG capsule Take 1 capsule (50 mg total) by mouth daily. 30 capsule 0   fluticasone (FLONASE) 50  MCG/ACT nasal spray PLACE 2 SPRAYS INTO BOTH NOSTRILS DAILY AS NEEDED 16 g 0   ibuprofen (ADVIL) 400 MG tablet Take 1 tablet (400 mg total) by mouth every 8 (eight) hours as needed. 30 tablet 0   levonorgestrel (MIRENA) 20 MCG/24HR IUD 1 each by Intrauterine route once.     omeprazole (PRILOSEC) 40 MG capsule Take 1 capsule (40 mg total) by mouth daily. (Patient not taking: Reported on 01/25/2023) 30 capsule 11   polyethylene glycol powder (GLYCOLAX/MIRALAX) 17 GM/SCOOP powder Take one capful once daily. (Patient not taking: Reported on 06/24/2022) 255 g 5   Current Facility-Administered Medications on File Prior to Visit  Medication Dose Route Frequency Provider Last Rate Last Admin   acetaminophen (TYLENOL) tablet 500 mg  500 mg Oral Once Concepcion Living, MD        Allergies: No Known Allergies  Vital Signs:  There were no vitals taken for this visit.   Neurological Exam: MENTAL STATUS including orientation to time, place, person, recent and remote memory, attention span and concentration, language, and fund of knowledge is normal.  Speech is not dysarthric.  CRANIAL NERVES:  No visual field defects.  Pupils equal round and reactive to light.  Normal conjugate, extra-ocular eye movements in all directions of gaze.  No ptosis.  Face is symmetric.   MOTOR:  Motor strength is 5/5 in all extremities.  No atrophy, fasciculations or abnormal movements.  No pronator drift.  Tone is normal.    MSRs:  Reflexes are 2+/4 throughout.  SENSORY:  Intact to vibration throughout.  COORDINATION/GAIT:  Normal finger-to- nose-finger.  Intact rapid alternating movements bilaterally.  Gait narrow based and stable.   Data: MRI brain wwo contrast 09/01/2019: This MRI of the brain with and without contrast shows the following: 1.   There are multiple T2/flair hyperintense foci in the subcortical white matter.  None of these appear to be acute and there do not appear to be any new foci compared to the 2017  MRI.  This is a nonspecific finding and could represent sequela of migraine, chronic microvascular ischemic change or prior infection or trauma.  There are no foci in the periventricular white matter or the infratentorial white matter and demyelination is less likely. 2.   There was a normal enhancement pattern and no acute findings.   MRI cervical spine wwo contrast 09/01/2019:  Normal   NCS/EMG of the left arm 10/04/2019:  Normal  NCS/EMG of bilateral arm 02/25/2022:  Normal   IMPRESSION/PLAN: Left > right arm paresthesias, negative EMG, MRI brain and cervical spine.  Fortunately, she is having some improving with physical therapy  - Encouraged compliance of home exercises  - If symptoms get worse, repeat NCS/EMG of the arms  2.  Dizziness  - I do not appreciate any exam findings to suggest this is neurological  Return to clinic as needed ***  Thank you for allowing me to participate in patient's care.  If I can answer any additional questions, I would be pleased to do so.    Sincerely,    Harrie Cazarez K. Posey Pronto, DO

## 2023-01-31 ENCOUNTER — Encounter: Payer: Self-pay | Admitting: Neurology

## 2023-01-31 ENCOUNTER — Ambulatory Visit: Payer: Medicaid Other | Admitting: Neurology

## 2023-02-03 DIAGNOSIS — F4311 Post-traumatic stress disorder, acute: Secondary | ICD-10-CM | POA: Diagnosis not present

## 2023-02-05 DIAGNOSIS — G4733 Obstructive sleep apnea (adult) (pediatric): Secondary | ICD-10-CM | POA: Diagnosis not present

## 2023-02-10 DIAGNOSIS — F4311 Post-traumatic stress disorder, acute: Secondary | ICD-10-CM | POA: Diagnosis not present

## 2023-02-11 DIAGNOSIS — G4733 Obstructive sleep apnea (adult) (pediatric): Secondary | ICD-10-CM | POA: Diagnosis not present

## 2023-02-16 DIAGNOSIS — F4311 Post-traumatic stress disorder, acute: Secondary | ICD-10-CM | POA: Diagnosis not present

## 2023-02-21 ENCOUNTER — Encounter (HOSPITAL_COMMUNITY): Payer: Self-pay

## 2023-02-21 ENCOUNTER — Ambulatory Visit (HOSPITAL_COMMUNITY)
Admission: EM | Admit: 2023-02-21 | Discharge: 2023-02-21 | Disposition: A | Discharge status: Home / Self Care | Payer: Medicaid Other | Attending: Family Medicine | Admitting: Family Medicine

## 2023-02-21 ENCOUNTER — Ambulatory Visit (INDEPENDENT_AMBULATORY_CARE_PROVIDER_SITE_OTHER): Payer: Medicaid Other

## 2023-02-21 DIAGNOSIS — S61431A Puncture wound without foreign body of right hand, initial encounter: Secondary | ICD-10-CM | POA: Diagnosis not present

## 2023-02-21 DIAGNOSIS — M79641 Pain in right hand: Secondary | ICD-10-CM | POA: Diagnosis not present

## 2023-02-21 DIAGNOSIS — R600 Localized edema: Secondary | ICD-10-CM | POA: Diagnosis not present

## 2023-02-21 MED ORDER — IBUPROFEN 800 MG PO TABS: 800.0000 mg | ORAL_TABLET | Freq: Three times a day (TID) | ORAL | 0 refills | Status: DC | PRN

## 2023-02-21 NOTE — ED Notes (Signed)
Writer went to get the patient a school excuse and patient had hand dressing unwrapped an on the floor. Patient picked the dressing up off of the floor and rewrapped her hand. Patient states she thought the dressing was on too tight.

## 2023-02-21 NOTE — ED Triage Notes (Signed)
Patient states she was horse playing with her brother and she had a pencil in her right hand. The pencil went into the palm of her right hand and thinks a piece of the lead is still in the right hand.

## 2023-02-21 NOTE — ED Provider Notes (Signed)
[facility]    CSN: [id number] Arrival date & time: 02/21/23  1749      History   Chief Complaint No chief complaint on file.   HPI Lauren Obrien is a 22 y.o. female.   HPI Here for pain in her right hand. This afternoon she was playing around with a family member and her right palm and getting struck by a pencil with the sharp end.  She has a cut on her palm and is concerned that the pencil lead is still there.  It is hurting a lot and she states that now her right arm is going numb.  On review of the chart last tetanus was April 2016.  Last menstrual cycle was March 29  Past Medical History:  Diagnosis Date   Acne vulgaris    Cough 11/17/2021   COVID-19 12/11/2020   Dizziness 08/23/2019   Folliculitis 08/29/2019   GERD (gastroesophageal reflux disease)    Helicobacter pylori gastritis    Hidradenitis suppurativa    Neuropathy    BUE   Pharyngitis 11/13/2019   Pre-diabetes    Seasonal allergies    Sleep apnea    Sleep deprivation 09/28/2018   Snoring 01/22/2020   Sore throat 11/13/2019    Patient Active Problem List   Diagnosis Date Noted   Encounter for IUD removal 01/26/2023   Attempted IUD removal, unsuccessful 11/17/2022   Acute left ankle pain 06/24/2022   Strep pharyngitis 05/29/2022   Umbilical discharge 05/04/2022   Paresthesias 03/20/2022   Routine screening for STI (sexually transmitted infection) 12/26/2020   Right wrist tendinitis 06/06/2020   Viral URI 02/01/2020   Morbid obesity 01/22/2020   Chronic fatigue 01/22/2020   Excessive daytime sleepiness 01/22/2020   OSA (obstructive sleep apnea) 01/22/2020   Cellulitis of breast 01/16/2020   Oropharyngeal dysphagia 01/11/2020   Sore throat 11/13/2019   Prediabetes 08/29/2019   Memory difficulties 07/01/2019   Chronic tension type headache 05/29/2019   Acanthosis nigricans 05/29/2019   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   Hidradenitis suppurativa 03/04/2019   Carpal  tunnel syndrome of left wrist 01/04/2019   Blurry vision, bilateral 12/16/2018   Learning difficulty 12/16/2018   Left wrist tendinitis 12/01/2018   Irregular menstrual bleeding 04/03/2018   Acne vulgaris 04/03/2018   Acid reflux 01/21/2016   Allergic rhinitis 09/23/2015   Chronic constipation 07/24/2015    Past Surgical History:  Procedure Laterality Date   NO PAST SURGERIES     TOOTH EXTRACTION Bilateral 08/25/2021   Procedure: DENTAL RESTORATION/EXTRACTIONS;  Surgeon: Ocie Doyne, DMD;  Location: [facility];  Service: Oral Surgery;  Laterality: Bilateral;    OB History     Gravida  2   Para  1   Term  1   Preterm  0   AB  0   Living  1      SAB  0   IAB  0   Ectopic  0   Multiple      Live Births  1            Home Medications    Prior to Admission medications   Medication Sig Start Date End Date Taking? Authorizing Provider  ibuprofen (ADVIL) 800 MG tablet Take 1 tablet (800 mg total) by mouth every 8 (eight) hours as needed (pain). 02/21/23  Yes Zenia Resides, MD  cetirizine (ZYRTEC) 10 MG tablet Take 1 tablet (10 mg total) by mouth daily as needed for allergies. 10/15/22  Jerre SimonNorbert, John, MD  docusate sodium (COLACE) 50 MG capsule Take 1 capsule (50 mg total) by mouth daily. 10/25/22   Dameron, Nolberto HanlonMarisa, DO  fluticasone (FLONASE) 50 MCG/ACT nasal spray PLACE 2 SPRAYS INTO BOTH NOSTRILS DAILY AS NEEDED 10/15/22   Jerre SimonNorbert, John, MD  omeprazole (PRILOSEC) 40 MG capsule Take 1 capsule (40 mg total) by mouth daily. Patient not taking: Reported on 01/25/2023 05/21/22   Jerre SimonNorbert, John, MD  polyethylene glycol powder [facility](GLYCOLAX/MIRALAX) 17 GM/SCOOP powder Take one capful once daily. Patient not taking: Reported on 06/24/2022 04/28/21   Zehr, Princella PellegriniJessica D, PA-C    Family History Family History  Problem Relation Age of Onset   Diabetes Father    Hypertension Father    Asthma Maternal Grandmother    Diabetes Maternal Grandmother    Diabetes  Maternal Grandfather    Diabetes Paternal Grandmother    Diabetes Paternal Grandfather    Colon cancer Neg Hx    Esophageal cancer Neg Hx    Pancreatic cancer Neg Hx    Stomach cancer Neg Hx    Liver disease Neg Hx     Social History Social History   Tobacco Use   Smoking status: Never    Passive exposure: Never   Smokeless tobacco: Never  Vaping Use   Vaping Use: Never used  Substance Use Topics   Alcohol use: No    Alcohol/week: 0.0 standard drinks of alcohol   Drug use: No     Allergies   Patient has no known allergies.   Review of Systems Review of Systems   Physical Exam Triage Vital Signs ED Triage Vitals [02/21/23 1811]  Enc Vitals Group     BP 119/75     Pulse Rate 77     Resp 16     Temp 98.4 F (36.9 C)     Temp Source Oral     SpO2 98 %     Weight      Height      Head Circumference      Peak Flow      Pain Score 7     Pain Loc      Pain Edu?      Excl. in GC?    No data found.  Updated Vital Signs BP 119/75 (BP Location: Left Arm)   Pulse 77   Temp 98.4 F (36.9 C) (Oral)   Resp 16   LMP 02/11/2023   SpO2 98%   Visual Acuity Right Eye Distance:   Left Eye Distance:   Bilateral Distance:    Right Eye Near:   Left Eye Near:    Bilateral Near:     Physical Exam Vitals reviewed.  Constitutional:      General: She is not in acute distress.    Appearance: She is not ill-appearing, toxic-appearing or diaphoretic.  Skin:    Coloration: Skin is not jaundiced or pale.     Comments: There is a shallow laceration about 1 cm in the right palm, near the hypothenar eminence.  I am unable to palpate a foreign body normally able to visualize a foreign body.  There is no swelling.  There is tenderness around the laceration.  Neurological:     General: No focal deficit present.     Mental Status: She is alert and oriented to person, place, and time.  Psychiatric:        Behavior: Behavior normal.      UC Treatments / Results   Labs (all labs ordered  are listed, but only abnormal results are displayed) Labs Reviewed - No data to display  EKG   Radiology DG Hand Complete Right  Result Date: 02/21/2023 CLINICAL DATA:  Right hand pain, patient reports pencil poked the palm of her hand and is concerned that a piece of lead is still within the soft tissues. EXAM: RIGHT HAND - COMPLETE 3 VIEW COMPARISON:  None Available. FINDINGS: There is no evidence of fracture or dislocation. There is no evidence of arthropathy or other focal bone abnormality. Mild palmar soft tissue edema. No radiopaque foreign body. IMPRESSION: No radiopaque foreign body. Electronically Signed   By: Jacob Moores M.D.   On: 02/21/2023 19:36    Procedures Procedures (including critical care time)  Medications Ordered in UC Medications - No data to display  Initial Impression / Assessment and Plan / UC Course  I have reviewed the triage vital signs and the nursing notes.  Pertinent labs & imaging results that were available during my care of the patient were reviewed by me and considered in my medical decision making (see chart for details).         X-ray is benign, and there is no bony pathology and there is no visible radiopaque foreign body.  Bandage is applied here in Tdap booster is given as it has been over 5 years since he had a Tdap.  Wound care is explained  Her laceration is shallow and does not need to be sutured.  She is not bleeding at the time of exam, either. Final Clinical Impressions(s) / UC Diagnoses   Final diagnoses:  Right hand pain  Puncture wound of right hand, foreign body presence unspecified, initial encounter     Discharge Instructions      Your x-ray did not show any broken bones, and they could not see any foreign body  Clean the wound with soapy water or peroxide 2 times daily and then put new antibiotic ointment and a new bandage.  Take ibuprofen 800 mg--1 tab every 8 hours as needed for  pain.        ED Prescriptions     Medication Sig Dispense Auth. Provider   ibuprofen (ADVIL) 800 MG tablet Take 1 tablet (800 mg total) by mouth every 8 (eight) hours as needed (pain). 21 tablet Alann Avey, Janace Aris, MD      PDMP not reviewed this encounter.   Zenia Resides, MD 02/21/23 [phone]

## 2023-02-21 NOTE — Discharge Instructions (Addendum)
Your x-ray did not show any broken bones, and they could not see any foreign body  Clean the wound with soapy water or peroxide 2 times daily and then put new antibiotic ointment and a new bandage.  Take ibuprofen 800 mg--1 tab every 8 hours as needed for pain.

## 2023-02-24 DIAGNOSIS — F4311 Post-traumatic stress disorder, acute: Secondary | ICD-10-CM | POA: Diagnosis not present

## 2023-03-08 DIAGNOSIS — F4311 Post-traumatic stress disorder, acute: Secondary | ICD-10-CM | POA: Diagnosis not present

## 2023-03-15 ENCOUNTER — Other Ambulatory Visit: Payer: Self-pay

## 2023-03-15 DIAGNOSIS — J069 Acute upper respiratory infection, unspecified: Secondary | ICD-10-CM

## 2023-03-15 NOTE — Telephone Encounter (Signed)
Patient calls nurse line reporting allergy symptoms.   She reports congestion and watery eyes for ~ 3 weeks. She reports her voice is hoarse, however denies any sore throat. She denies any fevers or cough.   Patient has not been taking Cetirizine. Patient advised she should have refills at her pharmacy. Patient advised to try medication for symptom relief.   She is also requesting a refill on Flonase.   If symptoms worsen or persist with medication management patient advised to make a clinic apt.

## 2023-03-17 MED ORDER — FLUTICASONE PROPIONATE 50 MCG/ACT NA SUSP
NASAL | 0 refills | Status: DC
Start: 2023-03-17 — End: 2023-11-17

## 2023-03-18 IMAGING — DX DG UGI W/ HIGH DENSITY W/O KUB
6 of 9 series · 15 of 22 positions shown · non-contrast
Comparison: None.

CLINICAL DATA: GERD

EXAM:
UPPER GI SERIES WITH KUB
TECHNIQUE: After obtaining a scout radiograph a routine upper GI series was
performed using thin and high density barium.
FLUOROSCOPY TIME:  Fluoroscopy Time:  1 minutes 42 seconds
Radiation Exposure Index (if provided by the fluoroscopic device):
62.4 mGy
Number of Acquired Spot Images: 7

[t abdomen supine]
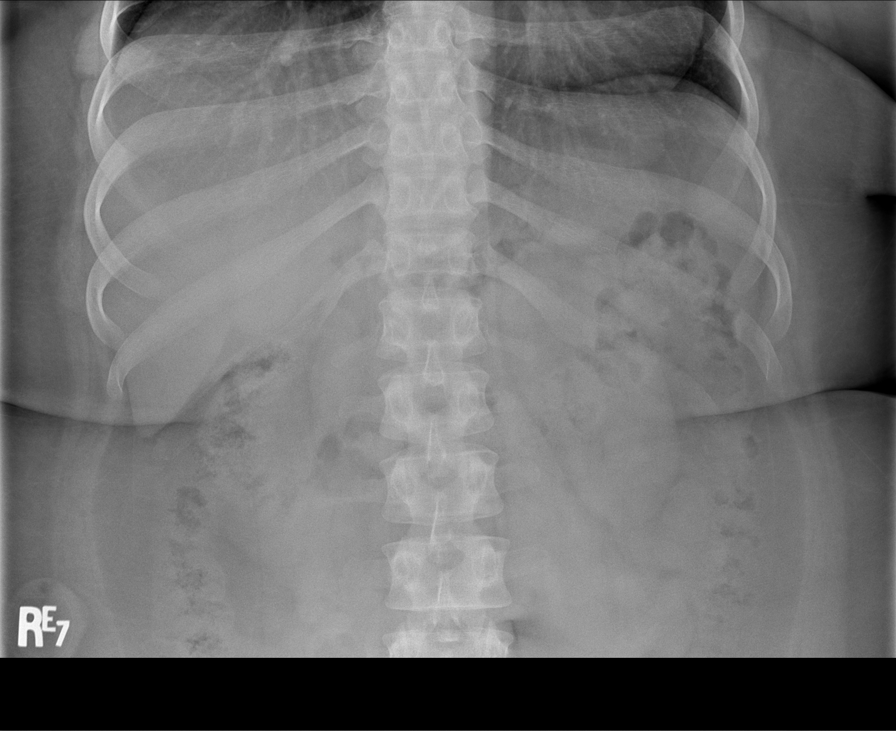

[Series 3: cp_standard · 0.52mm/px · 3 of 75 frames shown (1 of 4)]
[frame 12/75]
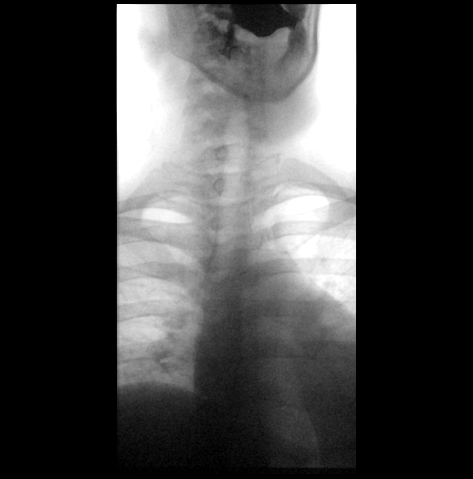
[frame 25/75]
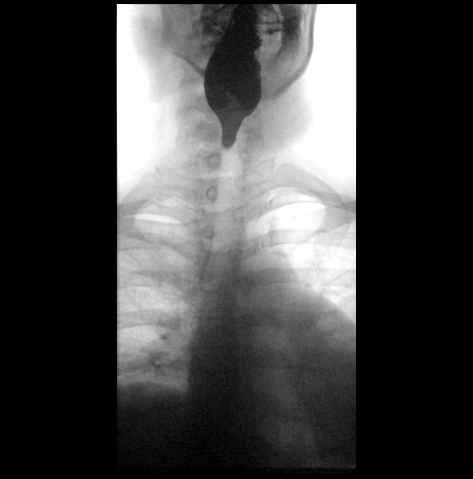
[frame 64/75]
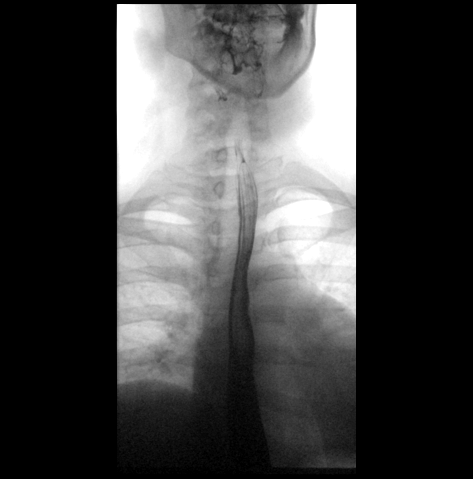

[Series 4: cp_standard · 0.52mm/px · 3 of 107 frames shown (2 of 4)]
[frame 12/107]
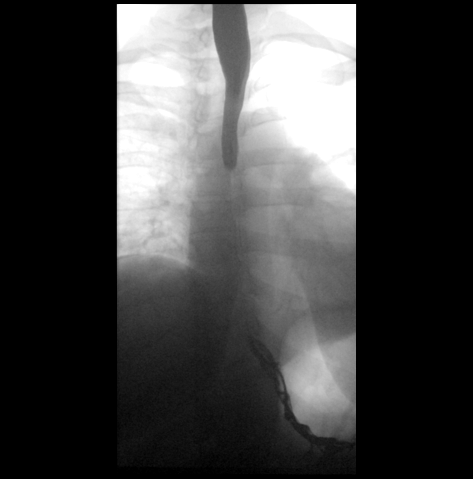
[frame 54/107]
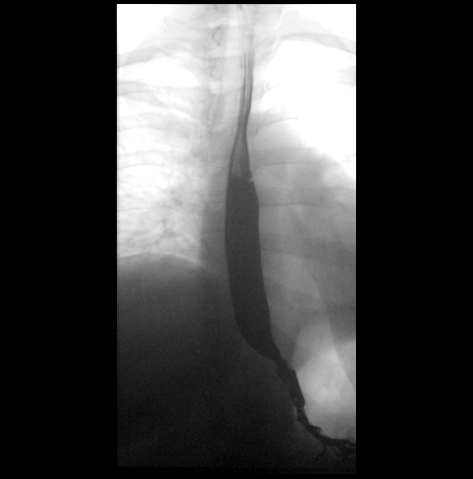
[frame 91/107]
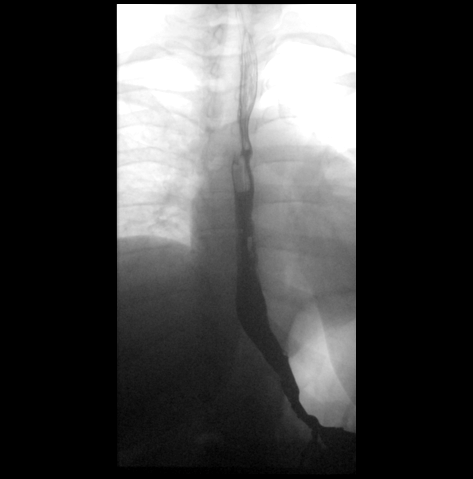

[Series 5: cp_standard · 0.52mm/px · 3 of 153 frames shown (3 of 4)]
[frame 48/153]
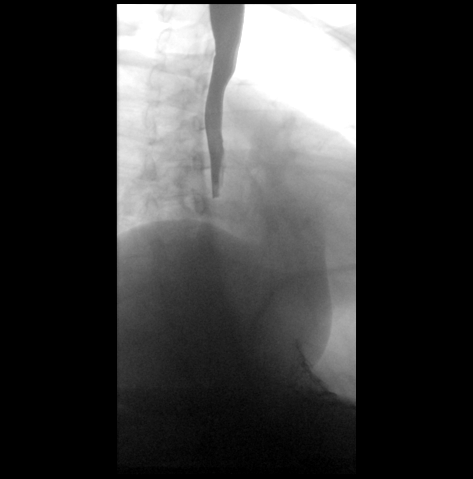
[frame 77/153]
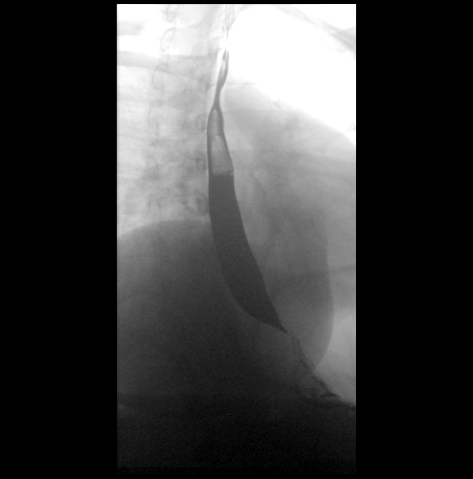
[frame 131/153]
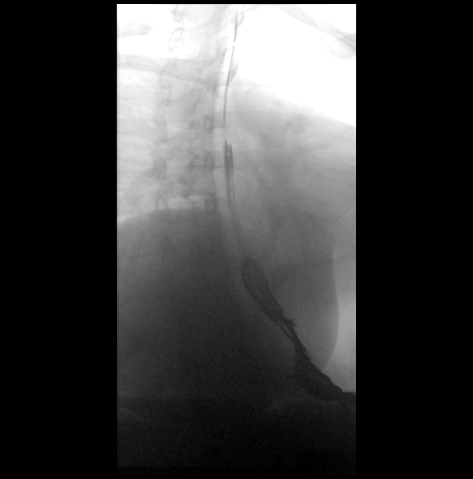

[Series 7: fluoro_barium 2fps_bw · 0.18mm/px · 2 of 2 frames shown]
[frame 1/2]
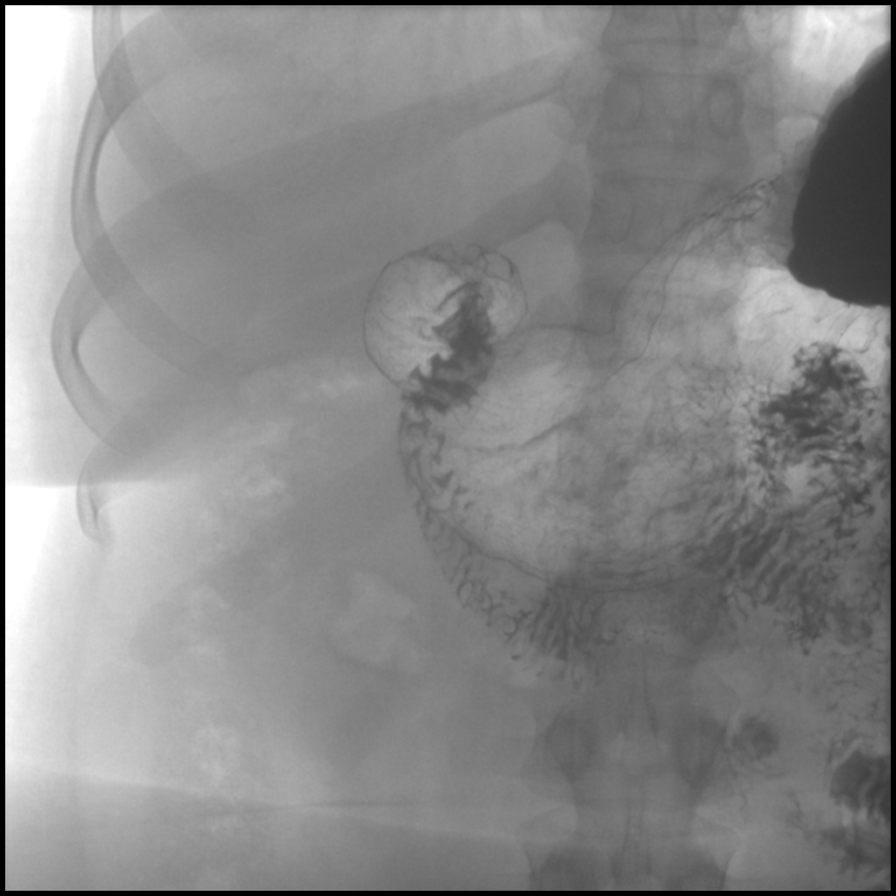
[frame 2/2]
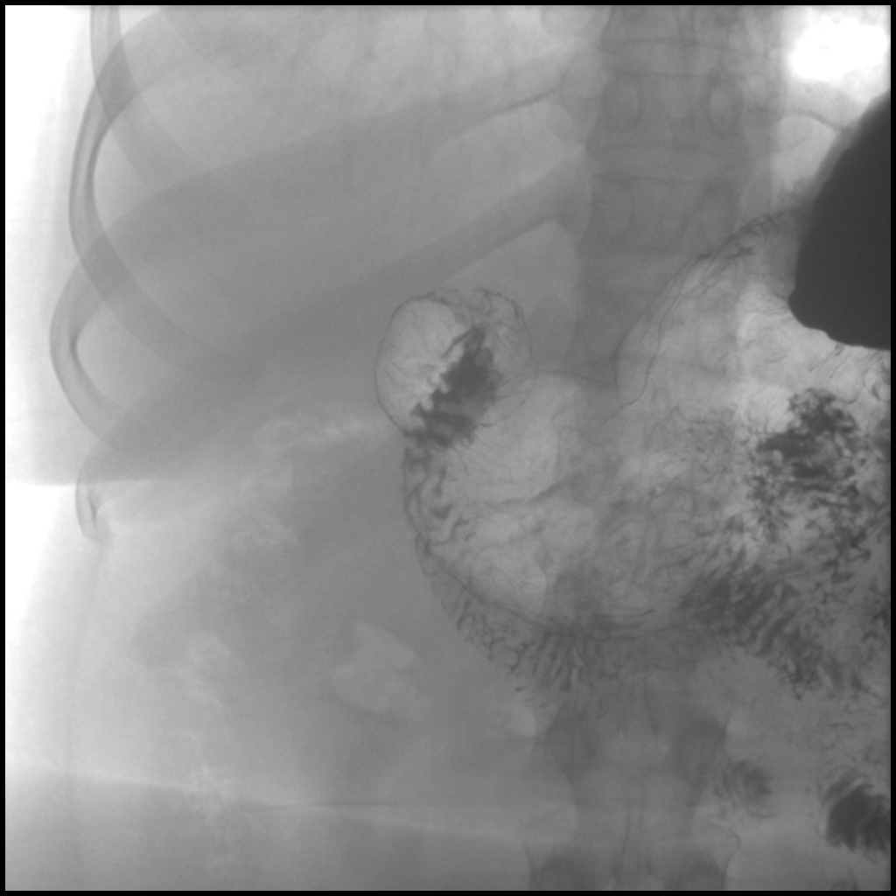

[Series 9: cp_standard · 0.55mm/px · 3 of 119 frames shown (4 of 4)]
[frame 18/119]
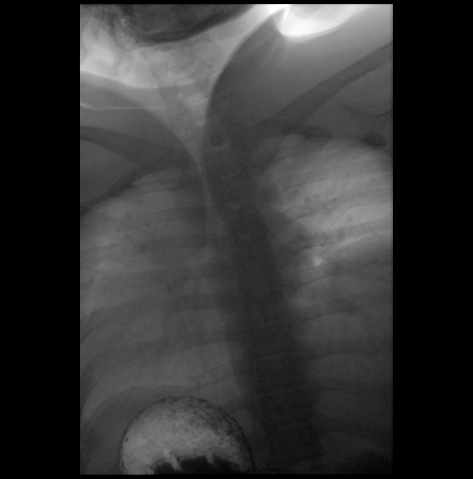
[frame 25/119]
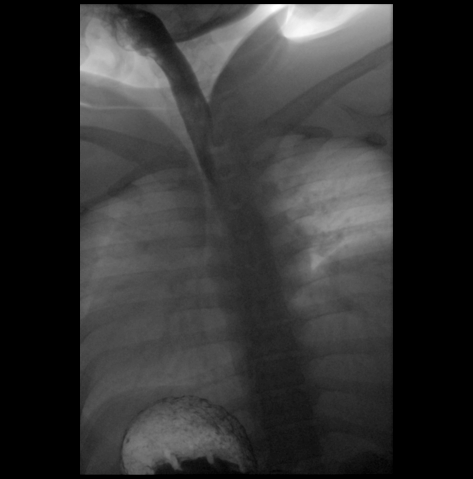
[frame 102/119]
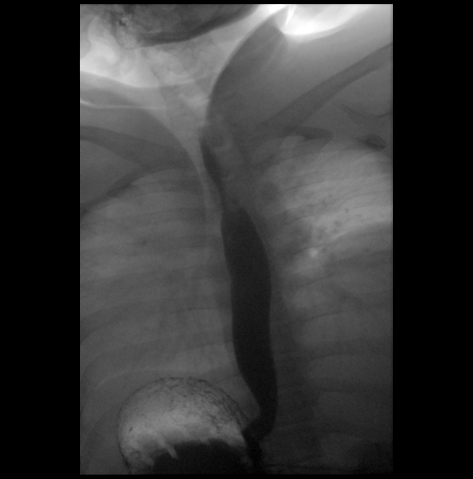

[15 of 22 positions shown; findings below may reference images not displayed]

FINDINGS: Normal esophageal peristalsis.

No fixed esophageal narrowing or stricture. A 13 mm barium tablet
passed into the stomach without delay.

No evidence of hiatal hernia.

Normal gastric folds. Duodenal bulb/proximal duodenum are within
normal limits.

No gastroesophageal reflux was demonstrated.
IMPRESSION: Negative upper GI.

Specifically, no gastroesophageal reflux was demonstrated.

## 2023-03-24 DIAGNOSIS — F4311 Post-traumatic stress disorder, acute: Secondary | ICD-10-CM | POA: Diagnosis not present

## 2023-03-25 ENCOUNTER — Ambulatory Visit: Payer: Medicaid Other

## 2023-03-25 ENCOUNTER — Ambulatory Visit (INDEPENDENT_AMBULATORY_CARE_PROVIDER_SITE_OTHER): Payer: Medicaid Other | Admitting: Student

## 2023-03-25 ENCOUNTER — Encounter: Payer: Self-pay | Admitting: Student

## 2023-03-25 ENCOUNTER — Other Ambulatory Visit: Payer: Self-pay

## 2023-03-25 VITALS — BP 112/72 | HR 87

## 2023-03-25 DIAGNOSIS — L608 Other nail disorders: Secondary | ICD-10-CM

## 2023-03-25 DIAGNOSIS — R5382 Chronic fatigue, unspecified: Secondary | ICD-10-CM

## 2023-03-25 NOTE — Patient Instructions (Addendum)
I don't know exactly what's going on with your nails. Let's send a sample to the lab. I'm going to check your blood work to get a baseline of your liver function. If this does look like fungus, I will send in the appropriate medicine for you.  I will also check your iron for you.  Eliezer Mccoy, MD

## 2023-03-26 LAB — CBC
Hematocrit: 38.5 % (ref 34.0–46.6)
Hemoglobin: 12.6 g/dL (ref 11.1–15.9)
MCH: 26 pg — ABNORMAL LOW (ref 26.6–33.0)
MCHC: 32.7 g/dL (ref 31.5–35.7)
MCV: 79 fL (ref 79–97)
Platelets: 300 10*3/uL (ref 150–450)
RBC: 4.85 x10E6/uL (ref 3.77–5.28)
RDW: 12.7 % (ref 11.7–15.4)
WBC: 8.2 10*3/uL (ref 3.4–10.8)

## 2023-03-26 LAB — COMPREHENSIVE METABOLIC PANEL
ALT: 32 IU/L (ref 0–32)
AST: 19 IU/L (ref 0–40)
Albumin/Globulin Ratio: 1.5 (ref 1.2–2.2)
Albumin: 4 g/dL (ref 4.0–5.0)
Alkaline Phosphatase: 79 IU/L (ref 44–121)
BUN/Creatinine Ratio: 18 (ref 9–23)
BUN: 11 mg/dL (ref 6–20)
Bilirubin Total: 0.4 mg/dL (ref 0.0–1.2)
CO2: 20 mmol/L (ref 20–29)
Calcium: 9.2 mg/dL (ref 8.7–10.2)
Chloride: 104 mmol/L (ref 96–106)
Creatinine, Ser: 0.61 mg/dL (ref 0.57–1.00)
Globulin, Total: 2.7 g/dL (ref 1.5–4.5)
Glucose: 83 mg/dL (ref 70–99)
Potassium: 4.3 mmol/L (ref 3.5–5.2)
Sodium: 140 mmol/L (ref 134–144)
Total Protein: 6.7 g/dL (ref 6.0–8.5)
eGFR: 130 mL/min/{1.73_m2} (ref >59)

## 2023-03-26 LAB — IRON,TIBC AND FERRITIN PANEL
Ferritin: 42 ng/mL (ref 15–150)
Iron Saturation: 30 % (ref 15–55)
Iron: 97 ug/dL (ref 27–159)
Total Iron Binding Capacity: 322 ug/dL (ref 250–450)
UIBC: 225 ug/dL (ref 131–425)

## 2023-03-28 DIAGNOSIS — L608 Other nail disorders: Secondary | ICD-10-CM | POA: Insufficient documentation

## 2023-03-28 NOTE — Assessment & Plan Note (Addendum)
Ongoing issue, continue to believe that this is most likely related to her being in school and being a single parent.  No history of anemia or heavy menses, however given that she is requesting iron testing, I do think it is reasonable to check a CBC and iron studies today. - Advised to follow-up with PCP for further evaluation - Could consider sleep study

## 2023-03-28 NOTE — Assessment & Plan Note (Addendum)
Appearance is not classic for onychomycosis.  It is a bit curious that most of the involvement seems to be on the distal portion of the nail.  I question whether this may just be a reaction to the nail treatment she had for graduation.  Psoriasis is also on the differential given the pitting of the nail, though less likely given no involvement of other nails, no plaques, no history of the same.  Also considered pseudomonal green nail but coloration is again not classic and there is no fissuring as one might expect. -Will send for fungal culture of the nail -CMP ordered to monitor liver function ahead of potential initiation of terbinafine pending results of fungal nail culture -Watchful waiting for now

## 2023-03-28 NOTE — Progress Notes (Signed)
    SUBJECTIVE:   CHIEF COMPLAINT / HPI:   Concern for Onychomycosis Here with subacute discoloration of the left thumbnail.  Tells me that she does have a history of some toenail fungus in the past but this is the first involvement of her fingernails.  She also notes that she recently had a new gel treatment of fingernails applied for her graduation from [employer].  Wonders if this might be related. Denies any history of psoriasis.  Chronic Fatigue She request that I check her iron levels today.  Denies a history of anemia but does tell me that she has been fatigued recently.  Did have this worked up a few months ago with unremarkable findings.  Thought that perhaps symptoms were due to the stress of being in school and being a single parent.  No shortness of breath or acute changes in symptoms over time.  OBJECTIVE:   BP 112/72   Pulse 87   SpO2 100%   Gen: Well-appearing, in good spirits Skin: Without rash or plques Nails: See photo below. Thumb nail of L hand with distal discoloration and pitting. The base, however, appears healthy.   ASSESSMENT/PLAN:   Nail discoloration Appearance is not classic for onychomycosis.  It is a bit curious that most of the involvement seems to be on the distal portion of the nail.  I question whether this may just be a reaction to the nail treatment she had for graduation.  Psoriasis is also on the differential given the pitting of the nail, though less likely given no involvement of other nails, no plaques, no history of the same.  Also considered pseudomonal green nail but coloration is again not classic and there is no fissuring as one might expect. -Will send for fungal culture of the nail -CMP ordered to monitor liver function ahead of potential initiation of terbinafine pending results of fungal nail culture -Watchful waiting for now  Chronic fatigue Ongoing issue, continue to believe that this is most likely related to her being in school and being  a single parent.  No history of anemia or heavy menses, however given that she is requesting iron testing, I do think it is reasonable to check a CBC and iron studies today. - Advised to follow-up with PCP for further evaluation - Could consider sleep study     J Dorothyann Gibbs, MD [facility]

## 2023-03-29 ENCOUNTER — Other Ambulatory Visit: Payer: Self-pay

## 2023-03-29 ENCOUNTER — Ambulatory Visit (INDEPENDENT_AMBULATORY_CARE_PROVIDER_SITE_OTHER): Payer: Medicaid Other | Admitting: Student

## 2023-03-29 VITALS — BP 124/82 | HR 73 | Ht 66.0 in | Wt 301.0 lb

## 2023-03-29 DIAGNOSIS — R5382 Chronic fatigue, unspecified: Secondary | ICD-10-CM | POA: Diagnosis not present

## 2023-03-29 DIAGNOSIS — K219 Gastro-esophageal reflux disease without esophagitis: Secondary | ICD-10-CM | POA: Diagnosis not present

## 2023-03-29 MED ORDER — FAMOTIDINE 20 MG PO TABS
20.0000 mg | ORAL_TABLET | Freq: Every day | ORAL | 0 refills | Status: DC
Start: 2023-03-29 — End: 2023-09-09

## 2023-03-29 NOTE — Assessment & Plan Note (Signed)
Of note, this was discussed by Dr. Marisue Humble several days ago.  Patient states she has had a sleep study, I have encouraged her to use her CPAP.

## 2023-03-29 NOTE — Patient Instructions (Addendum)
It was great to see you today! Thank you for choosing [facility] for your primary care. Lauren Obrien was seen for sore throat.  Today we addressed: There are many many causes of your sore throat.  This could be viral, you could be having irritation from poor sleeping if you have obstructive sleep apnea, this could be reflux.  With your accompanying other symptoms, it is difficult to tell.  I would recommend trialing Pepcid which I have sent in for you.  I would also recommend that for your allergies you clear your nose with a nasal spray which is an over-the-counter medication prior to using your Flonase.  If you haven't already, sign up for My Chart to have easy access to your labs results, and communication with your primary care physician.  Return if symptoms worsen or fail to improve. Please arrive 15 minutes before your appointment to ensure smooth check in process.  We appreciate your efforts in making this happen.  Thank you for allowing me to participate in your care, Lauren Mattocks, DO 03/29/2023, 11:22 AM PGY-2, [facility]

## 2023-03-29 NOTE — Assessment & Plan Note (Signed)
Sore throat may be related to viral etiology although suspect more likely to be related to reflux.  No reason to believe this is strep throat.  Trial Pepcid.  If not effective may consider restarting omeprazole.  Should the symptoms continue even further she may want evaluation via EGD.

## 2023-03-29 NOTE — Progress Notes (Signed)
  SUBJECTIVE:   CHIEF COMPLAINT / HPI:   Sore throat: started 1 month ago on/off. She thought it was allergies. Endorses voice changes 2-3 times in the last month as well. Additionally fatigue and body aches. She is having to clear her throat a lot. Denies fever. She has not been using her CPAP. She has also been diagnosed with reflux in the past, is not taking any medication for that. She is currently taking Zyrtec and Flonase.  She does endorse sore throat that worsens after mealtime.  She usually eats around 9 PM and then goes to bed directly afterwards.  PERTINENT  PMH / PSH: Allergic rhinitis, HS, OSA, GERD  Patient Care Team: Lockie Mola, MD as PCP - General (Family Medicine) Adamo, Jeris Penta, MD (Family Medicine) Linna Darner, RD as Dietitian (Family Medicine) Abram Sander, MD (Family Medicine) Glendale Chard, DO as Consulting Physician (Neurology) OBJECTIVE:  BP 124/82   Pulse 73   Ht 5\' 6"  (1.676 m)   Wt (!) 301 lb (136.5 kg)   SpO2 100%   BMI 48.58 kg/m  Physical Exam Constitutional:      Appearance: She is well-developed.  HENT:     Right Ear: There is impacted cerumen.     Left Ear: There is impacted cerumen.     Mouth/Throat:     Mouth: Mucous membranes are moist.     Pharynx: Oropharynx is clear. No oropharyngeal exudate or posterior oropharyngeal erythema.     Tonsils: No tonsillar exudate or tonsillar abscesses. 1+ on the right. 1+ on the left.     Comments: Tonsillar hypertrophy Cardiovascular:     Rate and Rhythm: Normal rate and regular rhythm.     Heart sounds: Normal heart sounds.  Pulmonary:     Effort: Pulmonary effort is normal.     Breath sounds: Normal breath sounds.  Neurological:     Mental Status: She is alert.    ASSESSMENT/PLAN:  Gastroesophageal reflux disease without esophagitis Assessment & Plan: Sore throat may be related to viral etiology although suspect more likely to be related to reflux.  No reason to believe this is strep  throat.  Trial Pepcid.  If not effective may consider restarting omeprazole.  Should the symptoms continue even further she may want evaluation via EGD.  Orders: -     Famotidine; Take 1 tablet (20 mg total) by mouth daily.  Dispense: 30 tablet; Refill: 0  Chronic fatigue Assessment & Plan: Of note, this was discussed by Dr. Marisue Humble several days ago.  Patient states she has had a sleep study, I have encouraged her to use her CPAP.   Return if symptoms worsen or fail to improve. Shelby Mattocks, DO 03/29/2023, 12:06 PM PGY-2, [facility]

## 2023-04-01 DIAGNOSIS — F4311 Post-traumatic stress disorder, acute: Secondary | ICD-10-CM | POA: Diagnosis not present

## 2023-04-07 DIAGNOSIS — F4311 Post-traumatic stress disorder, acute: Secondary | ICD-10-CM | POA: Diagnosis not present

## 2023-04-14 DIAGNOSIS — F4311 Post-traumatic stress disorder, acute: Secondary | ICD-10-CM | POA: Diagnosis not present

## 2023-04-22 ENCOUNTER — Ambulatory Visit: Payer: Medicaid Other

## 2023-04-22 ENCOUNTER — Encounter: Payer: Self-pay | Admitting: Family Medicine

## 2023-04-22 ENCOUNTER — Ambulatory Visit (INDEPENDENT_AMBULATORY_CARE_PROVIDER_SITE_OTHER): Payer: Medicaid Other | Admitting: Family Medicine

## 2023-04-22 VITALS — BP 119/73 | HR 81 | Ht 66.0 in | Wt 303.0 lb

## 2023-04-22 DIAGNOSIS — R070 Pain in throat: Secondary | ICD-10-CM | POA: Diagnosis not present

## 2023-04-22 DIAGNOSIS — F4311 Post-traumatic stress disorder, acute: Secondary | ICD-10-CM | POA: Diagnosis not present

## 2023-04-22 DIAGNOSIS — Z599 Problem related to housing and economic circumstances, unspecified: Secondary | ICD-10-CM

## 2023-04-22 DIAGNOSIS — H6123 Impacted cerumen, bilateral: Secondary | ICD-10-CM

## 2023-04-22 LAB — POCT RAPID STREP A (OFFICE): Rapid Strep A Screen: NEGATIVE

## 2023-04-22 MED ORDER — DEXAMETHASONE 4 MG PO TABS: 8.0000 mg | ORAL_TABLET | Freq: Once | ORAL | 0 refills | Status: AC

## 2023-04-22 MED ORDER — AMOXICILLIN-POT CLAVULANATE 875-125 MG PO TABS
1.0000 | ORAL_TABLET | Freq: Two times a day (BID) | ORAL | 0 refills | Status: DC
Start: 2023-04-22 — End: 2023-09-03

## 2023-04-22 NOTE — Patient Instructions (Signed)
I am not sure what is causing your throat pain but given the you have failed treatment for reflux and allergies and are still continuing to have the symptoms I think it is worth sending you to the specialist now.  I have placed a referral to ENT, you should hear from them in the next few weeks.  In the meantime I am going to send in Augmentin for 10 days for treatment of presumed strep throat to see if that helps.  I am also sending in 1 dose of a steroid medication that can help with throat pain as well.  The ENT doctor will also be able to help with recurrent cleanings of your ears as needed.

## 2023-04-22 NOTE — Progress Notes (Signed)
    SUBJECTIVE:   CHIEF COMPLAINT / HPI:   Throat concerns - has been having issues with throat pain with swallowing x2 days - Voice intermittently goes in and out - Has been taking reflux and allergy meds without improvement - Previously saw ENT 3 years ago with recommendation for tonsillectomy given recurrent strep infections  Left Ear - produces a lot of wax - Has concerns about her hearing due to the amount of wax  PERTINENT  PMH / PSH: Reviewed  OBJECTIVE:   BP 119/73   Pulse 81   Ht 5\' 6"  (1.676 m)   Wt (!) 303 lb (137.4 kg)   LMP 04/21/2023   SpO2 100%   BMI 48.91 kg/m   General: NAD, well-appearing, well-nourished Respiratory: No respiratory distress, breathing comfortably, able to speak in full sentences Skin: warm and dry, no rashes noted on exposed skin Psych: Appropriate affect and mood HEENT: right submandibular lymph node, mild pharyngeal erythema, slightly tonsillar irritation, TM occluded bilaterally  ASSESSMENT/PLAN:   Sore throat Patient with history of recurrent strep throat infections, previously seen by ENT in 2021 with recommendation for tonsillectomy, which patient declined. Patient presents with sore throat and odynophagia x 2 days on physical exam with some erythema and cervical lymph node present.  Discussed with patient that we could send antibiotics and test for strep if she wanted to.  Patient strep test was negative counseled that if she continued to have worsening of the symptoms she can consider filling the antibiotics and trying them to see if it helps.  Also sent in a one-time dose of dexamethasone to assist with throat inflammation if she desires to fill this.  Voice changes Patient with history of recurrent strep throat infections, unclear if she could possibly be having an infection versus some other cause such as laryngeal spasms.  Due to tonsillar hypertrophy, unable to visualize much beyond that.  Feel the patient would benefit from  visit with ENT again for a scope. - Referral to ENT placed  Bilateral cerumen impaction Noted on exam, appears to be recurrent for patient.  Already referring to ENT for pharyngeal concerns and recommend that patient establish with ENT for more regular ear cleanings.  Ear washout was completed today.  SDOH Financial insecurity and needing assistance with light bill payment   Evelena Leyden, DO [facility]

## 2023-04-27 ENCOUNTER — Telehealth: Payer: Self-pay

## 2023-04-27 ENCOUNTER — Other Ambulatory Visit: Payer: Medicaid Other

## 2023-04-27 NOTE — Patient Outreach (Signed)
  Medicaid Managed Care   Unsuccessful Outreach Note  04/27/2023 Name: Lauren Obrien MRN: [id number] DOB: 04-23-2001  Referred by: Lockie Mola, MD Reason for referral : High Risk Managed Medicaid ([facility] [employer] work unsuccessful telephone outreach )   An unsuccessful telephone outreach was attempted today. The patient was referred to the case management team for assistance with care management and care coordination.   Follow Up Plan: A HIPAA compliant phone message was left for the patient providing contact information and requesting a return call.   Abelino Derrick, MHA [facility]  Managed [facility] Social Worker [phone]

## 2023-04-27 NOTE — Telephone Encounter (Signed)
Patient calls nurse line in regards to Augmentin.   She reports she has not picked this medication up from the pharmacy yet. She reports she has concerns on taking this medication with a negative strep test. She reports she does not want to take an antibiotic if she does not need to. However, she reports she is not feeling any better.   Advised to start the antibiotic if no improvement in symptoms, per note from provider.   ENT phone number given to patient as well to schedule an apt.

## 2023-04-27 NOTE — Patient Instructions (Addendum)
  Medicaid Managed Care   Unsuccessful Outreach Note  04/27/2023 Name: Lauren Obrien MRN: [id number] DOB: Sep 07, 2001  Referred by: Lockie Mola, MD Reason for referral : High Risk Managed Medicaid (Mm Social work unsuccessful telephone outreach )   An unsuccessful telephone outreach was attempted today. The patient was referred to the case management team for assistance with care management and care coordination.   Follow Up Plan: A HIPAA compliant phone message was left for the patient providing contact information and requesting a return call.   Gus Puma, Kenard Gower, MHA [facility]  Managed [facility] Social Worker [phone] Visit Information  [name] was given information about Medicaid Managed Care team care coordination services as a part of their Healthy [facility] Medicaid benefit. Lauren Obrien verbally consented to engagement with the [facility] Managed Care team.   If you are experiencing a medical emergency, please call 911 or report to your local emergency department or urgent care.   If you have a non-emergency medical problem during routine business hours, please contact your provider's office and ask to speak with a nurse.   For questions related to your Healthy [facility] health plan, please call: [phone] or visit the homepage here: [email or web address]  If you would like to schedule transportation through your Healthy [facility] plan, please call the following number at least 2 days in advance of your appointment: [phone]  For information about your ride after you set it up, call Ride Assist at [phone]. Use this number to activate a Will Call pickup, or if your transportation is late for a scheduled pickup. Use this number, too, if you need to make a change or cancel a previously scheduled reservation.  If you need transportation services right away, call [phone]. The after-hours call center is  staffed 24 hours to handle ride assistance and urgent reservation requests (including discharges) 365 days a year. Urgent trips include sick visits, hospital discharge requests and life-sustaining treatment.  Call the [facility] Line at [phone], at any time, 24 hours a day, 7 days a week. If you are in danger or need immediate medical attention call 911.  If you would like help to quit smoking, call 1-800-QUIT-NOW (417-013-4707) OR Espaol: 1-855-Djelo-Ya (1-884-166-0630) o para ms informacin haga clic aqu or Text READY to 160-109 to register via text  [name] - following are the goals we discussed in your visit today:   Goals Addressed   None     Social Worker will follow up in 30 days.  Gus Puma, Kenard Gower, MHA [facility]  Managed Medicaid Social Worker [phone]   Following is a copy of your plan of care:  There are no care plans that you recently modified to display for this patient.

## 2023-04-27 NOTE — Patient Outreach (Signed)
Medicaid Managed Care Social Work Note  04/27/2023 Name:  Lauren Obrien MRN:  [id number] DOB:  07/11/2001  Lauren Obrien is an 22 y.o. year old female who is a primary patient of Lockie Mola, MD.  The Medicaid [employer] naged Care Coordination team was consulted for assistance with:  [employer]   Ms. Aurea Aronov was given information about Medicaid Managed Care Coordination team services today. [name] Patient agreed to services and verbal consent obtained.  Engaged with patient  for by telephone forinitial visit in response to referral for case management and/or care coordination services.   Assessments/Interventions:  Review of past medical history, allergies, medications, health status, including review of consultants reports, laboratory and other test data, was performed as part of comprehensive evaluation and provision of chronic care management services.  SDOH: (Social Determinant of Health) assessments and interventions performed: SDOH Interventions    Flowsheet Row Telephone from 04/14/2022 in [employer] Care Coordination  SDOH Interventions   Housing Interventions Other (Comment)  [Patient needs housing for whole family couch camping]      BSW completed a telephone outreach with patient, she states she is now behind on her mortgage and needs assistance with utilities. Patient provided BSW with all the places she has tried and they are all of the resources BSW would have provided. Patient stated she tried [employer] a few months ago and they were unable to assists. Patient states she is not working, provided patient with resources on were to apply for jobs. Advanced Directives Status:  Not addressed in this encounter.  Care Plan                 No Known Allergies  Medications Reviewed Today     Reviewed by Shelby Mattocks, DO (Resident) on 03/29/23 at 1103  Med List Status: <None>   Medication Order Taking?  Sig Documenting Provider Last Dose Status Informant     Discontinued 03/29/23 1103   cetirizine (ZYRTEC) 10 MG tablet [id number] No Take 1 tablet (10 mg total) by mouth daily as needed for allergies. Jerre Simon, MD Taking Active   Discontinued 03/29/23 1103   fluticasone (FLONASE) 50 MCG/ACT nasal spray [id number]  PLACE 2 SPRAYS INTO BOTH NOSTRILS DAILY AS NEEDED Lockie Mola, MD  Active     Discontinued 03/29/23 1103   omeprazole (PRILOSEC) 40 MG capsule [id number] No Take 1 capsule (40 mg total) by mouth daily.  Patient not taking: Reported on 01/25/2023   Jerre Simon, MD Not Taking Active            Med Note Lockie Mola   Thu Jun 24, 2022  3:05 PM) Taking sometimes, as needed when she has reflux   Patient not taking:  Discontinued 03/29/23 1103             Patient Active Problem List   Diagnosis Date Noted   Nail discoloration 03/28/2023   Encounter for IUD removal 01/26/2023   Attempted IUD removal, unsuccessful 11/17/2022   Acute left ankle pain 06/24/2022   Strep pharyngitis 05/29/2022   Umbilical discharge 05/04/2022   Paresthesias 03/20/2022   Routine screening for STI (sexually transmitted infection) 12/26/2020   Right wrist tendinitis 06/06/2020   Viral URI 02/01/2020   Morbid obesity (HCC) 01/22/2020   Chronic fatigue 01/22/2020   Excessive daytime sleepiness 01/22/2020   OSA (obstructive sleep apnea) 01/22/2020   Cellulitis of breast 01/16/2020   Oropharyngeal dysphagia 01/11/2020   Sore throat 11/13/2019  Prediabetes 08/29/2019   Memory difficulties 07/01/2019   Chronic tension type headache 05/29/2019   Acanthosis nigricans 05/29/2019   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   Follow up:  Patient agrees to Care Plan and Follow-up.  Plan: The Managed Medicaid care management team will reach out to the patient again over the next 30 days.  Date/time of next scheduled Social Work care management/care coordination outreach:  05/27/23  Gus Puma, Kenard Gower, [facility]  Managed [facility] Social Worker [phone]

## 2023-05-03 ENCOUNTER — Encounter: Payer: Self-pay | Admitting: Neurology

## 2023-05-03 ENCOUNTER — Ambulatory Visit: Payer: Medicaid Other | Admitting: Neurology

## 2023-05-05 DIAGNOSIS — F4311 Post-traumatic stress disorder, acute: Secondary | ICD-10-CM | POA: Diagnosis not present

## 2023-05-12 DIAGNOSIS — F4311 Post-traumatic stress disorder, acute: Secondary | ICD-10-CM | POA: Diagnosis not present

## 2023-05-17 ENCOUNTER — Encounter: Payer: Self-pay | Admitting: Neurology

## 2023-05-17 ENCOUNTER — Ambulatory Visit: Payer: Medicaid Other | Admitting: Neurology

## 2023-05-26 DIAGNOSIS — F4311 Post-traumatic stress disorder, acute: Secondary | ICD-10-CM | POA: Diagnosis not present

## 2023-05-27 ENCOUNTER — Other Ambulatory Visit: Payer: Medicaid Other

## 2023-05-27 NOTE — Patient Instructions (Signed)
Visit Information  [name] was given information about Medicaid Managed Care team care coordination services as a part of their Healthy [facility] Medicaid benefit. Lauren Obrien verbally consented to engagement with the [facility] Managed Care team.   If you are experiencing a medical emergency, please call 911 or report to your local emergency department or urgent care.   If you have a non-emergency medical problem during routine business hours, please contact your provider's office and ask to speak with a nurse.   For questions related to your Healthy [facility] health plan, please call: [phone] or visit the homepage here: [email or web address]  If you would like to schedule transportation through your Healthy [facility] plan, please call the following number at least 2 days in advance of your appointment: [phone]  For information about your ride after you set it up, call Ride Assist at [phone]. Use this number to activate a Will Call pickup, or if your transportation is late for a scheduled pickup. Use this number, too, if you need to make a change or cancel a previously scheduled reservation.  If you need transportation services right away, call [phone]. The after-hours call center is staffed 24 hours to handle ride assistance and urgent reservation requests (including discharges) 365 days a year. Urgent trips include sick visits, hospital discharge requests and life-sustaining treatment.  Call the [facility] Line at [phone], at any time, 24 hours a day, 7 days a week. If you are in danger or need immediate medical attention call 911.  If you would like help to quit smoking, call 1-800-QUIT-NOW ((440) 283-3937) OR Espaol: 1-855-Djelo-Ya (4-742-595-6387) o para ms informacin haga clic aqu or Text READY to 564-332 to register via text  [name] - following are the goals we discussed in your visit  today:   Goals Addressed   None      The  Patient                                              has been provided with contact information for the Managed Medicaid care management team and has been advised to call with any health related questions or concerns.   Gus Puma, Kenard Gower, MHA [facility]  Managed Medicaid Social Worker [phone]   Following is a copy of your plan of care:  There are no care plans that you recently modified to display for this patient.

## 2023-05-27 NOTE — Patient Outreach (Signed)
Medicaid Managed Care Social Work Note  05/27/2023 Name:  Lauren Obrien MRN:  [id number] DOB:  [birth year]  Lauren Obrien is an 22 y.o. year old female who is a primary patient of Lockie Mola, MD.  The Medicaid Managed Care Coordination team was consulted for assistance with:   employment services  [name] was given information about Medicaid Managed Care Coordination team services today. Lauren Obrien Patient agreed to services and verbal consent obtained.  Engaged with patient  for by telephone forfollow up visit in response to referral for case management and/or care coordination services.   Assessments/Interventions:  Review of past medical history, allergies, medications, health status, including review of consultants reports, laboratory and other test data, was performed as part of comprehensive evaluation and provision of chronic care management services.  SDOH: (Social Determinant of Health) assessments and interventions performed: SDOH Interventions    Flowsheet Row Telephone from 04/14/2022 in [employer] [employer] Care Coordination  SDOH Interventions   Housing Interventions Other (Comment)  [Patient needs housing for whole family couch camping]     BSW completed a telephone outreach with patient, she is still not working and needs assistance with mortgage and utilities. BSW spoke with patient about applying to jobs on linkedin. BSW researched additional resources to provide to patient, but was unsuccessful.  Advanced Directives Status:  Not addressed in this encounter.  Care Plan                 No Known Allergies  Medications Reviewed Today     Reviewed by Shelby Mattocks, DO (Resident) on 03/29/23 at 1103  Med List Status: <None>   Medication Order Taking? Sig Documenting Provider Last Dose Status Informant     Discontinued 03/29/23 1103   cetirizine (ZYRTEC) 10 MG tablet [id number] No Take 1 tablet (10 mg total) by mouth daily as  needed for allergies. Jerre Simon, MD Taking Active   Discontinued 03/29/23 1103   fluticasone (FLONASE) 50 MCG/ACT nasal spray [id number]  PLACE 2 SPRAYS INTO BOTH NOSTRILS DAILY AS NEEDED Lockie Mola, MD  Active     Discontinued 03/29/23 1103   omeprazole (PRILOSEC) 40 MG capsule [id number] No Take 1 capsule (40 mg total) by mouth daily.  Patient not taking: Reported on 01/25/2023   Jerre Simon, MD Not Taking Active            Med Note Lockie Mola   Thu Jun 24, 2022  3:05 PM) Taking sometimes, as needed when she has reflux   Patient not taking:  Discontinued 03/29/23 1103             Patient Active Problem List   Diagnosis Date Noted   Nail discoloration 03/28/2023   Encounter for IUD removal 01/26/2023   Attempted IUD removal, unsuccessful 11/17/2022   Acute left ankle pain 06/24/2022   Strep pharyngitis 05/29/2022   Umbilical discharge 05/04/2022   Paresthesias 03/20/2022   Routine screening for STI (sexually transmitted infection) 12/26/2020   Right wrist tendinitis 06/06/2020   Viral URI 02/01/2020   Morbid obesity (HCC) 01/22/2020   Chronic fatigue 01/22/2020   Excessive daytime sleepiness 01/22/2020   OSA (obstructive sleep apnea) 01/22/2020   Cellulitis of breast 01/16/2020   Oropharyngeal dysphagia 01/11/2020   Sore throat 11/13/2019   Prediabetes 08/29/2019   Memory difficulties 07/01/2019   Chronic tension type headache 05/29/2019   Acanthosis nigricans 05/29/2019   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   Follow up:  Patient agrees to Care Plan and Follow-up.  Plan: The  Patient has been provided with contact information for the Managed Medicaid care management team and has been advised to call with any health related questions or concerns.   Lauren Obrien, MHA [facility]  Managed [facility] Social Worker [phone]

## 2023-05-31 DIAGNOSIS — H5213 Myopia, bilateral: Secondary | ICD-10-CM | POA: Diagnosis not present

## 2023-06-02 DIAGNOSIS — F4311 Post-traumatic stress disorder, acute: Secondary | ICD-10-CM | POA: Diagnosis not present

## 2023-06-08 ENCOUNTER — Ambulatory Visit (INDEPENDENT_AMBULATORY_CARE_PROVIDER_SITE_OTHER): Payer: Medicaid Other | Admitting: Student

## 2023-06-08 ENCOUNTER — Ambulatory Visit
Admission: RE | Admit: 2023-06-08 | Discharge: 2023-06-08 | Disposition: A | Discharge status: Home / Self Care | Payer: Medicaid Other | Source: Ambulatory Visit | Attending: Family Medicine | Admitting: Family Medicine

## 2023-06-08 VITALS — BP 112/70 | HR 83 | Ht 66.0 in | Wt 309.2 lb

## 2023-06-08 DIAGNOSIS — M545 Low back pain, unspecified: Secondary | ICD-10-CM

## 2023-06-08 DIAGNOSIS — M549 Dorsalgia, unspecified: Secondary | ICD-10-CM | POA: Diagnosis not present

## 2023-06-08 DIAGNOSIS — G8929 Other chronic pain: Secondary | ICD-10-CM

## 2023-06-08 NOTE — Patient Instructions (Addendum)
It was great to see you! Thank you for allowing me to participate in your care!  I recommend that you always bring your medications to each appointment as this makes it easy to ensure we are on the correct medications and helps [place] not miss when refills are needed.  Our plans for today:  - Back Pain This sounds a lot like arthritis, in which we may want to work on pain control and keeping active.  Plan to get X-ray of lower back   [facility]   [address] [place], [place] [address] Phone: [phone]   Pain control Use Voltaren gel as needed on back, and can use salonpass patches (lidocaine patches) as needed for pain.  -If this doesn't work, you can take oral pain med like tylenol and ibuprofen Ibuprofen 400 mg every 6 hours as needed or 800 mg every 8 hours as needed  Tylenol 500 mg every 6 hours as needed, or 1000 mg every 8 hours as needed  *No more than 2,400 mg of ibuprofen in 24 hours  **No more than 4,000 mg of tylenol in 24 hours     Take care and seek immediate care sooner if you develop any concerns.   Dr. Bess Kinds, MD [facility] Medicine

## 2023-06-08 NOTE — Progress Notes (Signed)
  SUBJECTIVE:   CHIEF COMPLAINT / HPI:   Back Pain  Patient notes low back pain that has been an issue for the last few months.  Patient notes back pain worse in the morning, and tends to get better into the afternoon.  Back pain limiting her range of motion and ability to interact/pick up her kids.  Sometimes back pain can last all day.  Back pain felt to be achy in quality, located in the lumbar region.  Patient otherwise feeling normal, no systemic symptoms, peeing and pooping like normal.  Patient also notes Tylenol can help relieve some of the pain.  Patient report's that last period was on 7/10 and she has not had sex since.   PERTINENT  PMH / PSH:   Patient Care Team: Lockie Mola, MD as PCP - General (Family Medicine) Adamo, Jeris Penta, MD (Family Medicine) Linna Darner, RD as Dietitian (Family Medicine) Abram Sander, MD (Family Medicine) Glendale Chard, DO as Consulting Physician (Neurology) OBJECTIVE:  BP 112/70   Pulse 83   Ht 5\' 6"  (1.676 m)   Wt (!) 309 lb 3.2 oz (140.3 kg)   LMP 05/25/2023   SpO2 98%   BMI 49.91 kg/m  Physical Exam Constitutional:      Appearance: Normal appearance.  Musculoskeletal:     Cervical back: No deformity, signs of trauma, rigidity, tenderness or bony tenderness. No pain with movement. Normal range of motion.     Thoracic back: No deformity, signs of trauma, spasms, tenderness or bony tenderness. Normal range of motion.     Lumbar back: Bony tenderness present. No deformity, spasms or tenderness. Normal range of motion. Negative right straight leg raise test and negative left straight leg raise test.     Comments: Pain with rotation/extension/flexion of back  Neurological:     Mental Status: She is alert.      ASSESSMENT/PLAN:  Chronic midline low back pain without sciatica Assessment & Plan: Patient reports low mid back pain for last few months, worse in the morning, better by the afternoon.  Patient notes Tylenol can help relieve  symptoms.  Patient denies any fevers, nausea vomiting diarrhea, systemic symptoms, and is peeing/pooping like normal.  Patient denies any history of trauma and given history lower concern for muscle strain/spasm.  Patient's symptoms most concerning for arthritis.  Patient is young however BMI nearly 50, may be putting extra stress on joints.  Will obtain back imaging.  Patient with negative straight leg rise, no concern for sciatica.  Will recommend topical pain meds. - DG lumbar 4 view - Voltaren/salon pass as needed - Continue to stay active  Orders: -     DG Lumbar Spine Complete; Future   No follow-ups on file. Bess Kinds, MD 06/10/2023, 11:29 PM PGY-3, [facility] Family Medicine

## 2023-06-10 DIAGNOSIS — M545 Low back pain, unspecified: Secondary | ICD-10-CM | POA: Insufficient documentation

## 2023-06-10 NOTE — Assessment & Plan Note (Signed)
Patient reports low mid back pain for last few months, worse in the morning, better by the afternoon.  Patient notes Tylenol can help relieve symptoms.  Patient denies any fevers, nausea vomiting diarrhea, systemic symptoms, and is peeing/pooping like normal.  Patient denies any history of trauma and given history lower concern for muscle strain/spasm.  Patient's symptoms most concerning for arthritis.  Patient is young however BMI nearly 50, may be putting extra stress on joints.  Will obtain back imaging.  Patient with negative straight leg rise, no concern for sciatica.  Will recommend topical pain meds. - DG lumbar 4 view - Voltaren/salon pass as needed - Continue to stay active

## 2023-06-20 DIAGNOSIS — R490 Dysphonia: Secondary | ICD-10-CM | POA: Diagnosis not present

## 2023-06-20 DIAGNOSIS — J382 Nodules of vocal cords: Secondary | ICD-10-CM | POA: Diagnosis not present

## 2023-06-20 DIAGNOSIS — H6123 Impacted cerumen, bilateral: Secondary | ICD-10-CM | POA: Diagnosis not present

## 2023-06-20 DIAGNOSIS — R070 Pain in throat: Secondary | ICD-10-CM | POA: Diagnosis not present

## 2023-06-23 ENCOUNTER — Ambulatory Visit (INDEPENDENT_AMBULATORY_CARE_PROVIDER_SITE_OTHER): Payer: Medicaid Other | Admitting: Student

## 2023-06-23 ENCOUNTER — Other Ambulatory Visit: Payer: Self-pay

## 2023-06-23 ENCOUNTER — Encounter: Payer: Self-pay | Admitting: Student

## 2023-06-23 VITALS — BP 104/79 | HR 95 | Ht 64.0 in | Wt 313.4 lb

## 2023-06-23 DIAGNOSIS — Z1152 Encounter for screening for COVID-19: Secondary | ICD-10-CM

## 2023-06-23 DIAGNOSIS — R7303 Prediabetes: Secondary | ICD-10-CM

## 2023-06-23 DIAGNOSIS — J029 Acute pharyngitis, unspecified: Secondary | ICD-10-CM | POA: Diagnosis not present

## 2023-06-23 LAB — POC SOFIA SARS ANTIGEN FIA: SARS Coronavirus 2 Ag: NEGATIVE

## 2023-06-23 LAB — POCT GLYCOSYLATED HEMOGLOBIN (HGB A1C): Hemoglobin A1C: 5.5 % (ref 4.0–5.6)

## 2023-06-23 LAB — POCT RAPID STREP A (OFFICE): Rapid Strep A Screen: NEGATIVE

## 2023-06-23 NOTE — Patient Instructions (Addendum)
Koleen Nimrod to see you! I will call you if your COVID or Strep are positive. I want you to do some research about Wegovy. Send me a MyChart message if you feel like this is something you want to try for weight loss.   You can find more information about surgery options at  [email or web address]  J Dorothyann Gibbs, MD

## 2023-06-23 NOTE — Progress Notes (Signed)
    SUBJECTIVE:   CHIEF COMPLAINT / HPI:   Sore Throat  Present since yesterday. No fevers. No known sick contacts. Able to eat and drink but with discomfort. Did see ENT last week for chronic hoarseness and was diagnosed with vocal cord polyps.   Weight Loss Desired Had previously discussed maybe going to [facility]. Wants to discuss options further today. Was not aware that Medicaid is now covering GLP1-a. She was previously told she didn't qualify due to not being diabetic. Also has been thinking about bariatric surgery as an option. Lots of questions about therapy options today.   Health Maintenance Due for pap, not interested in doing today but open to rescheduling in the coming weeks.   OBJECTIVE:   BP 104/79   Pulse 95   Ht 5\' 4"  (1.626 m)   Wt (!) 313 lb 6.4 oz (142.2 kg)   LMP 05/25/2023   SpO2 100%   BMI 53.79 kg/m   Physical Exam Vitals reviewed.  Constitutional:      General: She is not in acute distress. HENT:     Mouth/Throat:     Mouth: Mucous membranes are moist.     Pharynx: No oropharyngeal exudate, posterior oropharyngeal erythema or uvula swelling.     Tonsils: No tonsillar exudate.  Neck:     Comments: + R cervical adenopathy  Cardiovascular:     Rate and Rhythm: Normal rate and regular rhythm.     Heart sounds: No murmur heard. Pulmonary:     Effort: Pulmonary effort is normal. No respiratory distress.     Breath sounds: No wheezing.  Skin:    General: Skin is warm and dry.     Capillary Refill: Capillary refill takes less than 2 seconds.     Findings: No rash.      ASSESSMENT/PLAN:   Sore throat Strep and COVID negative today. Declined GC/Chlamydia testing. Likely viral in etiology given concomitant lymphadenopathy, anticipate self-resolving course.   Morbid obesity (HCC) Long discussion today. She is interested in both [place] and bariatric surgery, but plans to do more research into these at home. She will reach back out if she wants to pursue  either of these. Gave her the bariatric program website to look into their seminars, either in person or online.  Repeat A1c was 5.5% today.    Healthcare Maintenance She will return for a Pap next week.   Eliezer Mccoy, MD [facility]

## 2023-06-24 NOTE — Assessment & Plan Note (Addendum)
Strep and COVID negative today. Declined GC/Chlamydia testing. Likely viral in etiology given concomitant lymphadenopathy, anticipate self-resolving course.

## 2023-06-24 NOTE — Assessment & Plan Note (Addendum)
Long discussion today. She is interested in both [place] and bariatric surgery, but plans to do more research into these at home. She will reach back out if she wants to pursue either of these. Gave her the bariatric program website to look into their seminars, either in person or online.  Repeat A1c was 5.5% today.

## 2023-06-30 DIAGNOSIS — F4311 Post-traumatic stress disorder, acute: Secondary | ICD-10-CM | POA: Diagnosis not present

## 2023-07-11 ENCOUNTER — Encounter: Payer: Self-pay | Admitting: Neurology

## 2023-07-11 ENCOUNTER — Ambulatory Visit: Payer: Medicaid Other | Admitting: Family Medicine

## 2023-07-12 ENCOUNTER — Ambulatory Visit: Payer: Medicaid Other | Admitting: Neurology

## 2023-07-12 ENCOUNTER — Encounter: Payer: Self-pay | Admitting: Neurology

## 2023-07-12 NOTE — Telephone Encounter (Signed)
I have made a note on the appt desk

## 2023-07-14 DIAGNOSIS — F4311 Post-traumatic stress disorder, acute: Secondary | ICD-10-CM | POA: Diagnosis not present

## 2023-07-14 NOTE — Telephone Encounter (Signed)
Patient dismissed form [facility] and all providers practicing at this clinic will no longer be able to proved medical care to you. The decision is due to our No Show Poloicy 07/12/23

## 2023-07-19 ENCOUNTER — Ambulatory Visit: Payer: Medicaid Other | Admitting: Family Medicine

## 2023-07-19 ENCOUNTER — Encounter: Payer: Self-pay | Admitting: Family Medicine

## 2023-07-19 ENCOUNTER — Other Ambulatory Visit: Payer: Self-pay

## 2023-07-19 ENCOUNTER — Telehealth: Payer: Self-pay | Admitting: Family Medicine

## 2023-07-19 VITALS — BP 109/70 | HR 91 | Ht 64.0 in | Wt 310.4 lb

## 2023-07-19 DIAGNOSIS — R42 Dizziness and giddiness: Secondary | ICD-10-CM | POA: Diagnosis not present

## 2023-07-19 NOTE — Progress Notes (Signed)
    SUBJECTIVE:   CHIEF COMPLAINT / HPI:   Dizziness and nausea Patient has history of dizziness dating back to 2016.  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  Neurology also evaluated for dizziness who did not find likely neurological cause.  She has been given meclizine to help with this which has been effective when taken per chart review.  Today, patient's dizziness has returned this morning whenever she sat up in bed.  She ultimately went back to sleep after looking on her phone and woke up again with persistent dizziness.  Dizziness is worse when sitting up or standing up quickly.  Has also had nausea with this and vomited once this morning.  Here in the office she gets dizzy when having her eyes closed or moving her head back-and-forth or walking.  No headache.  Does feel like she is photosensitive.  No changes in vision or hearing.  No infectious symptoms.  Nobody else with similar symptoms in the home.  Was told this could be due to hypotension or vertigo in the past.  Has only drank 3 bottles of water over the last 24 hours.  Says at times her head feels full of pressure when she lies down.  OBJECTIVE:   BP 109/70   Pulse 91   Ht 5\' 4"  (1.626 m)   Wt (!) 310 lb 6.4 oz (140.8 kg)   SpO2 99%   BMI 53.28 kg/m   General: Alert and oriented, in NAD Skin: Warm, dry, and intact  HEENT: NCAT, EOM grossly normal, midline nasal septum Cardiac: RRR, no m/r/g appreciated Respiratory: CTAB posteriorly, breathing and speaking comfortably on RA Abdominal: Soft, nontender, nondistended Extremities: Moves all extremities grossly equally Neurological: Cranial nerves II through XII intact, sensation intact throughout, strength normal in all extremities, normal gait, Dix-Hallpike maneuver negative though dizziness reproduced when sitting up on exam table  from lying position Psychiatric: Appropriate mood and affect   Orthostatic VS for the past 24 hrs:  BP- Lying Pulse- Lying BP- Sitting Pulse- Sitting BP- Standing at 0 minutes Pulse- Standing at 0 minutes  07/19/23 1049 112/68 84 123/75 90 (!) 87/54 97   ASSESSMENT/PLAN:   Dizziness Chronic, though episodic, problem.  Reassured by normal neurologic exam.  Orthostatic vital signs positive.  Volume depletion causing orthostasis most likely cause.  Could also be some component of displaced otoconia even though Dix-Hallpike negative.  Consider some element of inner ear dysfunction as well given recurrent pharyngitis/tonsillitis and recommendation for tonsillectomy. Less likely arrhythmia, stroke, idiopathic intracranial hypertension, medication side effect, vestibular migraine, vestibular neuritis.  Recommend increasing fluid consumption until urine is a light/clear yellow, following up with ENT, performing Epley at home, and going to vestibular rehab again.  Follow-up in 1 month to assess improvement.   Health maintenance Recommend obtaining Pap smear and vaccines at next visit with PCP.  Janeal Holmes, MD [facility]

## 2023-07-19 NOTE — Telephone Encounter (Signed)
Pt came for appt. today.  She is stating her work place is requesting a physician's note saying she needs lumbar support and what type.  Pt. ph # is [phone]

## 2023-07-19 NOTE — Telephone Encounter (Signed)
Patient returns call to nurse line regarding today's appointment. She states that she forgot to ask provider for anti nausea medicine during visit.   She is requesting that prescription be sent to [facility] on [address].   Please advise.   Veronda Prude, RN

## 2023-07-19 NOTE — Assessment & Plan Note (Signed)
Chronic, though episodic, problem.  Reassured by normal neurologic exam.  Orthostatic vital signs positive.  Volume depletion causing orthostasis most likely cause.  Could also be some component of displaced otoconia even though Dix-Hallpike negative.  Consider some element of inner ear dysfunction as well given recurrent pharyngitis/tonsillitis and recommendation for tonsillectomy. Less likely arrhythmia, stroke, idiopathic intracranial hypertension, medication side effect, vestibular migraine, vestibular neuritis.  Recommend increasing fluid consumption until urine is a light/clear yellow, following up with ENT, performing Epley at home, and going to vestibular rehab again.  Follow-up in 1 month to assess improvement.

## 2023-07-19 NOTE — Patient Instructions (Addendum)
It was great to see you today! Here's what we talked about:  You have orthostatic hypotension which can occur when you are dehydrated. I would try to steadily drink water throughout the day and try to target a urine color of clear-light yellow. Follow up with the ENT doctor in case you could have some inner ear dysfunction: [facility], Address: [address] [place], [place], [place] [address], Phone: [phone] Perform the Epley maneuver at home to help with dizziness as well. I will refer you again to vestibular rehab to help with symptoms.  Please let me know if you have any other questions.  Dr. Phineas Real

## 2023-07-21 DIAGNOSIS — F4311 Post-traumatic stress disorder, acute: Secondary | ICD-10-CM | POA: Diagnosis not present

## 2023-07-21 NOTE — Telephone Encounter (Signed)
Attempted to call patient back to provide update.   She did not answer. LVM asking that she return call to office to discuss further.   Veronda Prude, RN

## 2023-07-21 NOTE — Telephone Encounter (Signed)
Patient returns call to nurse line.   Patient advised of Dr. Lacie Draft recommendations. However, she does report she is feeling better and no longer interested in medication at this this time.   In regards to note.  She reports she needs a note stating she suffers from back pain and would benefit from lumbar support. She reports her employer will accommodate this for her with a note. Otherwise, she reports she will have to purchase "things" herself to be more comfortable.   I advised an office visit with PCP, however she reports "I was just there in July for that."  Will forward to PCP.

## 2023-07-22 NOTE — Telephone Encounter (Signed)
LVM for patient to call back and schedule an appointment in order for the doctor to write a detailed note for her employer. Penni Bombard CMA

## 2023-07-28 DIAGNOSIS — F4311 Post-traumatic stress disorder, acute: Secondary | ICD-10-CM | POA: Diagnosis not present

## 2023-08-02 DIAGNOSIS — F4311 Post-traumatic stress disorder, acute: Secondary | ICD-10-CM | POA: Diagnosis not present

## 2023-08-11 DIAGNOSIS — F4311 Post-traumatic stress disorder, acute: Secondary | ICD-10-CM | POA: Diagnosis not present

## 2023-08-12 ENCOUNTER — Ambulatory Visit: Payer: Medicaid Other | Admitting: Student

## 2023-08-12 ENCOUNTER — Other Ambulatory Visit (HOSPITAL_COMMUNITY)
Admission: RE | Admit: 2023-08-12 | Discharge: 2023-08-12 | Disposition: A | Discharge status: Home / Self Care | Payer: Medicaid Other | Source: Ambulatory Visit | Attending: Family Medicine | Admitting: Family Medicine

## 2023-08-12 VITALS — BP 123/72 | HR 89 | Wt 309.2 lb

## 2023-08-12 DIAGNOSIS — Z124 Encounter for screening for malignant neoplasm of cervix: Secondary | ICD-10-CM | POA: Insufficient documentation

## 2023-08-12 DIAGNOSIS — Z23 Encounter for immunization: Secondary | ICD-10-CM | POA: Diagnosis not present

## 2023-08-12 DIAGNOSIS — R42 Dizziness and giddiness: Secondary | ICD-10-CM | POA: Diagnosis not present

## 2023-08-12 NOTE — Patient Instructions (Addendum)
It was great to see you! Thank you for allowing me to participate in your care!  I recommend that you always bring your medications to each appointment as this makes it easy to ensure you are on the correct medications and helps [place] not miss when refills are needed.  Our plans for today:  - Flu vaccine today  - I will look in to vestibular rehab and let you know   We are checking some labs today, I will call you if they are abnormal will send you a MyChart message or a letter if they are normal.  If you do not hear about your labs in the next 2 weeks please let us know.  Take care and seek immediate care sooner if you develop any concerns.   Dr. Erick Alley, DO [facility]

## 2023-08-12 NOTE — Progress Notes (Unsigned)
    SUBJECTIVE:   CHIEF COMPLAINT / HPI:   Patient presents for Pap smear Declines need for STD testing while we are doing Pap, not sexually active.  Dizziness Patient seen earlier this month for intermittent dizziness, orthostatic vitals were positive.  Sometimes dizziness occurs when standing too quickly but also can occur with rapid head movement.  Was previously referred to vestibular rehab however was unable to schedule appointment due to time constraints.  Plan was then to refer her to neuro rehab at [facility]. She has not heard from them yet. Today states symptoms are unchanged, no falls or feeling like she is going to pass out.  PERTINENT  PMH / PSH: Obesity, orthostatic hypotension, dizziness  OBJECTIVE:   BP 123/72   Pulse 89   Wt (!) 309 lb 3.2 oz (140.3 kg)   BMI 53.07 kg/m    General: NAD, pleasant, able to participate in exam Cardiac: RRR, no murmurs. Respiratory: CTAB, normal effort, No wheezes, rales or rhonchi Skin: warm and dry, no rashes noted GU: Chaperoned by CMA.  Normal external female genitalia, pink moist vaginal mucosa, normal-appearing cervix with no lesions or masses, scant amount of clear discharge. Neuro: alert, cranial nerves II through XII intact, sensation intact, finger-nose-finger is normal Psych: Normal affect and mood  ASSESSMENT/PLAN:   Dizziness Unchanged from last visit.  Neuroexam reassuring.  I agree this is likely combination of orthostatic hypotension and possibly BPPV.  I provided patient with contact information for vestibular rehab at [facility]. Discussed going from lying to sitting and standing very slowly, return precautions discussed.   Health maintenance: Pap smear completed today.  Received flu vaccine today. Patient advised to schedule appoint with PCP in near future, can consider COVID-vaccine, hep C screening, HPV vaccines  Dr. Erick Alley, DO [facility]

## 2023-08-13 ENCOUNTER — Encounter: Payer: Self-pay | Admitting: Student

## 2023-08-13 NOTE — Assessment & Plan Note (Signed)
Unchanged from last visit.  Neuroexam reassuring.  I agree this is likely combination of orthostatic hypotension and possibly BPPV.  I provided patient with contact information for vestibular rehab at [facility]. Discussed going from lying to sitting and standing very slowly, return precautions discussed.

## 2023-08-15 ENCOUNTER — Encounter: Payer: Self-pay | Admitting: Physical Therapy

## 2023-08-15 ENCOUNTER — Ambulatory Visit: Payer: Medicaid Other | Attending: Family Medicine | Admitting: Physical Therapy

## 2023-08-15 DIAGNOSIS — M542 Cervicalgia: Secondary | ICD-10-CM | POA: Insufficient documentation

## 2023-08-15 DIAGNOSIS — R42 Dizziness and giddiness: Secondary | ICD-10-CM | POA: Diagnosis not present

## 2023-08-15 NOTE — Therapy (Addendum)
OUTPATIENT PHYSICAL THERAPY VESTIBULAR EVALUATION     Patient Name: Lauren Obrien MRN: [id number] DOB:2001-07-10, 22 y.o., female Today's Date: 08/15/2023  END OF SESSION:  PT End of Session - 08/15/23 1705     Visit Number 1    Number of Visits 12    Date for PT Re-Evaluation 11/14/23    Authorization Type [employer]    PT Start Time 1700    PT Stop Time 1745    PT Time Calculation (min) 45 min    Activity Tolerance Patient tolerated treatment well    Behavior During Therapy [facility] for tasks assessed/performed             Past Medical History:  Diagnosis Date   Acne vulgaris    Cough 11/17/2021   COVID-19 12/11/2020   Dizziness 08/23/2019   Folliculitis 08/29/2019   GERD (gastroesophageal reflux disease)    Helicobacter pylori gastritis    Hidradenitis suppurativa    Neuropathy    BUE   Pharyngitis 11/13/2019   Pre-diabetes    Routine screening for STI (sexually transmitted infection) 12/26/2020   Seasonal allergies    Sleep apnea    Sleep deprivation 09/28/2018   Snoring 01/22/2020   Sore throat 11/13/2019   Strep pharyngitis 05/29/2022   Viral URI 02/01/2020   Past Surgical History:  Procedure Laterality Date   NO PAST SURGERIES     TOOTH EXTRACTION Bilateral 08/25/2021   Procedure: DENTAL RESTORATION/EXTRACTIONS;  Surgeon: Ocie Doyne, DMD;  Location: [facility];  Service: Oral Surgery;  Laterality: Bilateral;   Patient Active Problem List   Diagnosis Date Noted   Chronic midline low back pain without sciatica 06/10/2023   Nail discoloration 03/28/2023   Attempted IUD removal, unsuccessful 11/17/2022   Umbilical discharge 05/04/2022   Paresthesias 03/20/2022   Right wrist tendinitis 06/06/2020   Morbid obesity (HCC) 01/22/2020   Chronic fatigue 01/22/2020   Excessive daytime sleepiness 01/22/2020   OSA (obstructive sleep apnea) 01/22/2020   Oropharyngeal dysphagia 01/11/2020   Prediabetes 08/29/2019   Dizziness 08/23/2019    Memory difficulties 07/01/2019   Chronic tension type headache 05/29/2019   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   Hidradenitis suppurativa 03/04/2019   Carpal tunnel syndrome of left wrist 01/04/2019   Learning difficulty 12/16/2018   Left wrist tendinitis 12/01/2018   Irregular menstrual bleeding 04/03/2018   Acid reflux 01/21/2016   Chronic constipation 07/24/2015    PCP: Marsh Dolly, MD REFERRING PROVIDER: Terisa Starr, MD  REFERRING DIAG: vertigo, dizziness  THERAPY DIAG:  Dizziness and giddiness  ONSET DATE: 08/13/23  Rationale for Evaluation and Treatment: Rehabilitation  SUBJECTIVE:   SUBJECTIVE STATEMENT: Patient reports that she has been treated for dizziness previously, she reports that they have looked at positional hypotension vs BPPV.  Unsure of a true cause, a lot of times she thinks it is with getting up out of bed quickly, reports some fear of doing the manuevers because of how dizzy she has felt Pt accompanied by: self  PERTINENT HISTORY: GERD  PAIN:  Are you having pain? Yes: NPRS scale: 2/10 Pain location: upper traps and neck Pain description: tight, HA Aggravating factors: stress Relieving factors: rest  PRECAUTIONS: None  RED FLAGS: None   WEIGHT BEARING RESTRICTIONS: No  FALLS: Has patient fallen in last 6 months? 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  DIAGNOSTIC FINDINGS: none  COGNITION: Overall cognitive status: Within functional limits for tasks assessed   SENSATION: WFL  PALPATION:  Very tight and tender in the upper traps  POSTURE:  rounded shoulders and forward head  Cervical ROM:  all motions  increased dizziness a little  Active A/PROM (deg) eval  Flexion WNL  Extension Decreased 25%  Right lateral flexion WNL  Left lateral flexion WNL  Right rotation WNL  Left rotation WNL  (Blank rows = not tested)  STRENGTH: UE strength 4-/5  VESTIBULAR ASSESSMENT:  DHI:  58%  SYMPTOM BEHAVIOR:   Non-Vestibular symptoms: neck pain and headaches  Type of dizziness: Lightheadedness/Faint  Frequency: everyday  Duration: minutes to hours  Aggravating factors: Worse in the morning and bending forward, stress, getting up in the morning  Relieving factors: closing eyes and slow movements  Progression of symptoms: unchanged  OCULOMOTOR EXAM:  Ocular Alignment: normal  Ocular ROM: No Limitations  Spontaneous Nystagmus: absent  Gaze-Induced Nystagmus: absent  Smooth Pursuits: saccades mild  Saccades: dysmetria  Convergence/Divergence:  cm   VESTIBULAR - OCULAR REFLEX:   Slow VOR: Positive Bilaterally  VOR Cancellation: Unable to Maintain Gaze    POSITIONAL TESTING: Right Dix-Hallpike: no nystagmus Left Dix-Hallpike: no nystagmus  MOTION SENSITIVITY:  Reports that she would rate her dizziness a 2-3/10 most times but does increase with bending forward, quick movements and turns   OTHOSTATICS: not done  VESTIBULAR TREATMENT:                                                                                                   DATE:    PATIENT EDUCATION: Education details: HEP and POC Person educated: Patient Education method: Programmer, multimedia, Demonstration, Verbal cues, and Handouts Education comprehension: verbalized understanding  HOME EXERCISE PROGRAM: VOR exercises   GOALS: Goals reviewed with patient? Yes  SHORT TERM GOALS: Target date: 08/27/23  Independent with initial HEP Goal status: INITIAL  LONG TERM GOALS: Target date: 11/14/23  Independent with advanced HEP Goal status: INITIAL  2.  Assess BPPV again and orthostatics Goal status: INITIAL  3.  Report  that she has 50% less dizziness with getting up in the AM Goal status: INITIAL  4.  No dizziness with looking up or down Goal status: INITIAL  ASSESSMENT:  CLINICAL IMPRESSION: Patient is a 22 y.o. female who was seen today for physical therapy evaluation and treatment for dizziness. Reports that she has had some issues with dizziness for about 2 years.  Charlesetta Ivory was negative bilaterally, she has increased dizziness with bending forward, looking up and with quick movements in the AM.  MD asked her to drink more water as they felt she had some dehydration.  She did have issues with some VOR activities so I gave VOR exercises today  OBJECTIVE IMPAIRMENTS: Abnormal gait, decreased activity tolerance, decreased balance, decreased coordination, decreased endurance, decreased mobility, difficulty walking, decreased ROM, decreased strength, dizziness, increased muscle spasms, impaired flexibility, impaired vision/preception,  improper body mechanics, postural dysfunction, and pain.   REHAB POTENTIAL: Good  CLINICAL DECISION MAKING: Stable/uncomplicated  EVALUATION COMPLEXITY: Low   PLAN:  PT FREQUENCY: 1-2x/week  PT DURATION: 12 weeks  PLANNED INTERVENTIONS: Therapeutic exercises, Therapeutic activity, Neuromuscular re-education, Balance training, Gait training, Patient/Family education, Self Care, Joint mobilization, Vestibular training, Canalith repositioning, Visual/preceptual remediation/compensation, Dry Needling, Electrical stimulation, Spinal mobilization, Cryotherapy, Moist heat, Taping, Traction, Ultrasound, and Manual therapy  PLAN FOR NEXT SESSION: continue to assess VOR vs BPPV vs orthostatic issues, help with neck spasms   Kyleeann Cremeans W, PT 08/15/2023, 5:06 PM

## 2023-08-16 LAB — CYTOLOGY - PAP: Diagnosis: NEGATIVE

## 2023-08-18 DIAGNOSIS — F4311 Post-traumatic stress disorder, acute: Secondary | ICD-10-CM | POA: Diagnosis not present

## 2023-08-25 DIAGNOSIS — F4311 Post-traumatic stress disorder, acute: Secondary | ICD-10-CM | POA: Diagnosis not present

## 2023-08-29 ENCOUNTER — Ambulatory Visit: Payer: Medicaid Other | Attending: Family Medicine | Admitting: Physical Therapy

## 2023-08-29 ENCOUNTER — Encounter: Payer: Self-pay | Admitting: Physical Therapy

## 2023-08-29 DIAGNOSIS — M542 Cervicalgia: Secondary | ICD-10-CM | POA: Insufficient documentation

## 2023-08-29 DIAGNOSIS — R42 Dizziness and giddiness: Secondary | ICD-10-CM | POA: Diagnosis not present

## 2023-08-29 NOTE — Therapy (Signed)
OUTPATIENT PHYSICAL THERAPY VESTIBULAR EVALUATION     Patient Name: Lauren Obrien MRN: [id number] DOB:[birth year], 22 y.o., female Today's Date: 08/29/2023  END OF SESSION:  PT End of Session - 08/29/23 1316     Visit Number 2    Number of Visits 12    Date for PT Re-Evaluation 11/14/23    Authorization Type [employer]    PT Start Time 1315    PT Stop Time 1400    PT Time Calculation (min) 45 min    Activity Tolerance Patient tolerated treatment well    Behavior During Therapy [facility] for tasks assessed/performed             Past Medical History:  Diagnosis Date   Acne vulgaris    Cough 11/17/2021   COVID-19 12/11/2020   Dizziness 08/23/2019   Folliculitis 08/29/2019   GERD (gastroesophageal reflux disease)    Helicobacter pylori gastritis    Hidradenitis suppurativa    Neuropathy    BUE   Pharyngitis 11/13/2019   Pre-diabetes    Routine screening for STI (sexually transmitted infection) 12/26/2020   Seasonal allergies    Sleep apnea    Sleep deprivation 09/28/2018   Snoring 01/22/2020   Sore throat 11/13/2019   Strep pharyngitis 05/29/2022   Viral URI 02/01/2020   Past Surgical History:  Procedure Laterality Date   NO PAST SURGERIES     TOOTH EXTRACTION Bilateral 08/25/2021   Procedure: DENTAL RESTORATION/EXTRACTIONS;  Surgeon: Ocie Doyne, DMD;  Location: [facility];  Service: Oral Surgery;  Laterality: Bilateral;   Patient Active Problem List   Diagnosis Date Noted   Chronic midline low back pain without sciatica 06/10/2023   Nail discoloration 03/28/2023   Attempted IUD removal, unsuccessful 11/17/2022   Umbilical discharge 05/04/2022   Paresthesias 03/20/2022   Right wrist tendinitis 06/06/2020   Morbid obesity (HCC) 01/22/2020   Chronic fatigue 01/22/2020   Excessive daytime sleepiness 01/22/2020   OSA (obstructive sleep apnea) 01/22/2020   Oropharyngeal dysphagia 01/11/2020   Prediabetes 08/29/2019   Dizziness  08/23/2019   Memory difficulties 07/01/2019   Chronic tension type headache 05/29/2019   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   Hidradenitis suppurativa 03/04/2019   Carpal tunnel syndrome of left wrist 01/04/2019   Learning difficulty 12/16/2018   Left wrist tendinitis 12/01/2018   Irregular menstrual bleeding 04/03/2018   Acid reflux 01/21/2016   Chronic constipation 07/24/2015    PCP: Marsh Dolly, MD REFERRING PROVIDER: Terisa Starr, MD  REFERRING DIAG: vertigo, dizziness  THERAPY DIAG:  Dizziness and giddiness  Cervicalgia  ONSET DATE: 08/13/23  Rationale for Evaluation and Treatment: Rehabilitation  SUBJECTIVE:   SUBJECTIVE STATEMENT: Patient reports that she stopped the HEP due to making her dizzy.   Pt accompanied by: self  PERTINENT HISTORY: GERD  PAIN:  Are you having pain? Yes: NPRS scale: 2/10 Pain location: upper traps and neck Pain description: tight, HA Aggravating factors: stress Relieving factors: rest  PRECAUTIONS: None  RED FLAGS: None   WEIGHT BEARING RESTRICTIONS: No  FALLS: Has patient fallen in last 6 months? 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  DIAGNOSTIC FINDINGS: none  COGNITION: Overall cognitive status: Within functional limits for tasks assessed  SENSATION: WFL  PALPATION:  Very tight and tender in the upper traps  POSTURE:  rounded shoulders and forward head  Cervical ROM:  all motions increased dizziness a little  Active A/PROM (deg) eval  Flexion WNL  Extension Decreased 25%  Right lateral flexion WNL  Left lateral flexion WNL  Right rotation WNL  Left rotation WNL  (Blank rows =  not tested)  STRENGTH: UE strength 4-/5  VESTIBULAR ASSESSMENT:  DHI:  58%  SYMPTOM BEHAVIOR:   Non-Vestibular symptoms: neck pain and headaches  Type of dizziness: Lightheadedness/Faint  Frequency: everyday  Duration: minutes to hours  Aggravating factors: Worse in the morning and bending forward, stress, getting up in the morning  Relieving factors: closing eyes and slow movements  Progression of symptoms: unchanged  OCULOMOTOR EXAM:  Ocular Alignment: normal  Ocular ROM: No Limitations  Spontaneous Nystagmus: absent  Gaze-Induced Nystagmus: absent  Smooth Pursuits: saccades mild  Saccades: dysmetria  Convergence/Divergence:  cm   VESTIBULAR - OCULAR REFLEX:   Slow VOR: Positive Bilaterally  VOR Cancellation: Unable to Maintain Gaze    POSITIONAL TESTING: Right Dix-Hallpike: no nystagmus Left Dix-Hallpike: no nystagmus  MOTION SENSITIVITY:  Reports that she would rate her dizziness a 2-3/10 most times but does increase with bending forward, quick movements and turns   OTHOSTATICS: not done  VESTIBULAR TREATMENT:                                                                                                   DATE:   08/29/23 Dix halpike negative bilaterally, she felt supported and did not get dizzy BP sitting 134/110 BP standing 130/100 BP Supine  130/100 Reviewed VOR, performed 2x Education on the VOR, vs BPVVV, vs orthostatics, she has a lot of questions that we went over   PATIENT EDUCATION: Education details: HEP and POC Person educated: Patient Education method: Programmer, multimedia, Facilities manager, Verbal cues, and Handouts Education comprehension: verbalized understanding  HOME EXERCISE PROGRAM: VOR exercises   GOALS: Goals reviewed with patient? Yes  SHORT TERM GOALS: Target date: 08/27/23  Independent with initial HEP Goal status: INITIAL  LONG TERM GOALS: Target date: 11/14/23  Independent with advanced HEP Goal status: INITIAL  2.  Assess  BPPV again and orthostatics Goal status: INITIAL  3.  Report that she has 50% less dizziness with getting up in the AM Goal status: INITIAL  4.  No dizziness with looking up or down Goal status: INITIAL  ASSESSMENT:  CLINICAL IMPRESSION: Patient is a 22 y.o. female who was seen today for physical therapy evaluation and treatment for dizziness. Reports that she has had some issues with dizziness for about 2 years.  Dix Halpike was negative bilaterally, reviewed the VOR and posture today, performed and did orthostatics, not much change from position to position  OBJECTIVE IMPAIRMENTS: Abnormal gait, decreased activity tolerance, decreased balance, decreased coordination, decreased endurance, decreased mobility, difficulty walking, decreased ROM, decreased strength, dizziness, increased muscle spasms, impaired flexibility, impaired vision/preception, improper body mechanics, postural dysfunction, and pain.   REHAB POTENTIAL: Good  CLINICAL DECISION MAKING: Stable/uncomplicated  EVALUATION COMPLEXITY: Low  PLAN:  PT FREQUENCY: 1-2x/week  PT DURATION: 12 weeks  PLANNED INTERVENTIONS: Therapeutic exercises, Therapeutic activity, Neuromuscular re-education, Balance training, Gait training, Patient/Family education, Self Care, Joint mobilization, Vestibular training, Canalith repositioning, Visual/preceptual remediation/compensation, Dry Needling, Electrical stimulation, Spinal mobilization, Cryotherapy, Moist heat, Taping, Traction, Ultrasound, and Manual therapy  PLAN FOR NEXT SESSION: continue to assess VOR vs BPPV vs orthostatic issues, help with neck spasms   Dhani Imel W, PT 08/29/2023, 1:17 PM

## 2023-08-31 DIAGNOSIS — G4733 Obstructive sleep apnea (adult) (pediatric): Secondary | ICD-10-CM | POA: Diagnosis not present

## 2023-09-01 DIAGNOSIS — F4311 Post-traumatic stress disorder, acute: Secondary | ICD-10-CM | POA: Diagnosis not present

## 2023-09-03 ENCOUNTER — Encounter (HOSPITAL_COMMUNITY): Payer: Self-pay

## 2023-09-03 ENCOUNTER — Ambulatory Visit (HOSPITAL_COMMUNITY)
Admission: EM | Admit: 2023-09-03 | Discharge: 2023-09-03 | Disposition: A | Discharge status: Home / Self Care | Payer: Medicaid Other | Attending: Internal Medicine | Admitting: Internal Medicine

## 2023-09-03 DIAGNOSIS — J069 Acute upper respiratory infection, unspecified: Secondary | ICD-10-CM | POA: Diagnosis not present

## 2023-09-03 DIAGNOSIS — J0100 Acute maxillary sinusitis, unspecified: Secondary | ICD-10-CM

## 2023-09-03 DIAGNOSIS — Z1152 Encounter for screening for COVID-19: Secondary | ICD-10-CM | POA: Diagnosis not present

## 2023-09-03 LAB — POCT INFLUENZA A/B
Influenza A, POC: NEGATIVE
Influenza B, POC: NEGATIVE

## 2023-09-03 MED ORDER — PROMETHAZINE-DM 6.25-15 MG/5ML PO SYRP: 5.0000 mL | ORAL_SOLUTION | Freq: Four times a day (QID) | ORAL | 0 refills | Status: DC | PRN

## 2023-09-03 MED ORDER — AMOXICILLIN-POT CLAVULANATE 875-125 MG PO TABS: 1.0000 | ORAL_TABLET | Freq: Two times a day (BID) | ORAL | 0 refills | Status: DC

## 2023-09-03 NOTE — ED Provider Notes (Signed)
[facility]    CSN: [id number] Arrival date & time: 09/03/23  1729      History   Chief Complaint Chief Complaint  Patient presents with  . Headache  . Generalized Body Aches  . Cough  . Shortness of Breath    HPI Lauren Obrien is a 22 y.o. female.   22 year old female who presents to urgent care with complaints of nonproductive cough, body aches, headaches and shortness of breath for 2 to 3 days. Body aches possibly for over a week but didn't develop the other symptoms until the last 2-3 days. Feels like pain in chest when she coughs. Denies fevers or chills but did feel hot. Her brother is sick now and her son had developing pneumonia prior to her getting sick.    Headache Associated symptoms: cough   Associated symptoms: no abdominal pain, no back pain, no congestion, no ear pain, no eye pain, no fever, no seizures, no sore throat and no vomiting   Cough Associated symptoms: headaches and shortness of breath   Associated symptoms: no chest pain, no chills, no ear pain, no fever, no rash and no sore throat   Shortness of Breath Associated symptoms: cough and headaches   Associated symptoms: no abdominal pain, no chest pain, no ear pain, no fever, no rash, no sore throat and no vomiting     Past Medical History:  Diagnosis Date  . Acne vulgaris   . Cough 11/17/2021  . COVID-19 12/11/2020  . Dizziness 08/23/2019  . Folliculitis 08/29/2019  . GERD (gastroesophageal reflux disease)   . Helicobacter pylori gastritis   . Hidradenitis suppurativa   . Neuropathy    BUE  . Pharyngitis 11/13/2019  . Pre-diabetes   . Routine screening for STI (sexually transmitted infection) 12/26/2020  . Seasonal allergies   . Sleep apnea   . Sleep deprivation 09/28/2018  . Snoring 01/22/2020  . Sore throat 11/13/2019  . Strep pharyngitis 05/29/2022  . Viral URI 02/01/2020    Patient Active Problem List   Diagnosis Date Noted  . Chronic midline low back pain  without sciatica 06/10/2023  . Nail discoloration 03/28/2023  . Attempted IUD removal, unsuccessful 11/17/2022  . Umbilical discharge 05/04/2022  . Paresthesias 03/20/2022  . Right wrist tendinitis 06/06/2020  . Morbid obesity (HCC) 01/22/2020  . Chronic fatigue 01/22/2020  . Excessive daytime sleepiness 01/22/2020  . OSA (obstructive sleep apnea) 01/22/2020  . Oropharyngeal dysphagia 01/11/2020  . Prediabetes 08/29/2019  . Dizziness 08/23/2019  . Memory difficulties 07/01/2019  . Chronic tension type headache 05/29/2019  . 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 Hidradenitis suppurativa 03/04/2019  . Carpal tunnel syndrome of left wrist 01/04/2019  . Learning difficulty 12/16/2018  . Left wrist tendinitis 12/01/2018  . Irregular menstrual bleeding 04/03/2018  . Acid reflux 01/21/2016  . Chronic constipation 07/24/2015    Past Surgical History:  Procedure Laterality Date  . NO PAST SURGERIES    . TOOTH EXTRACTION Bilateral 08/25/2021   Procedure: DENTAL RESTORATION/EXTRACTIONS;  Surgeon: Ocie Doyne, DMD;  Location: [facility];  Service: Oral Surgery;  Laterality: Bilateral;    OB History     Gravida  2   Para  1   Term  1   Preterm  0   AB  0   Living  1      SAB  0   IAB  0   Ectopic  0   Multiple      Live Births  1            Home Medications    Prior to Admission medications   Medication Sig Start Date End Date Taking? Authorizing Provider  amoxicillin-clavulanate (AUGMENTIN) 875-125 MG tablet Take 1 tablet by mouth 2 (two) times daily. 04/22/23   Lilland, Alana, DO  cetirizine (ZYRTEC) 10 MG tablet Take 1 tablet (10 mg total) by mouth daily as needed for allergies. 10/15/22   Jerre Simon, MD  famotidine (PEPCID) 20 MG tablet Take 1 tablet (20 mg total) by mouth daily. 03/29/23   Shelby Mattocks, DO  fluticasone (FLONASE) 50 MCG/ACT nasal spray PLACE 2 SPRAYS INTO BOTH NOSTRILS DAILY AS NEEDED 03/17/23   Lockie Mola, MD  omeprazole (PRILOSEC) 40 MG capsule Take 1 capsule (40 mg total) by mouth daily. Patient not taking: Reported on 01/25/2023 05/21/22   Jerre Simon, MD    Family History Family History  Problem Relation Age of Onset  . Diabetes Father   . Hypertension Father   . Asthma Maternal Grandmother   . Diabetes Maternal Grandmother   . Diabetes Maternal Grandfather   . Diabetes Paternal Grandmother   . Diabetes Paternal Grandfather   . Colon cancer Neg Hx   . Esophageal cancer Neg Hx   . Pancreatic cancer Neg Hx   . Stomach cancer Neg Hx   . Liver disease Neg Hx     Social History Social History   Tobacco Use  . Smoking status: Never    Passive exposure: Never  . Smokeless tobacco: Never  Vaping Use  . Vaping status: Never Used  Substance Use Topics  . Alcohol use: No    Alcohol/week: 0.0 standard drinks of alcohol  . Drug use: No     Allergies   Patient has no known allergies.   Review of Systems Review of Systems  Constitutional:  Negative for appetite change, chills and fever.  HENT:  Negative for congestion, ear pain and sore throat.   Eyes:  Negative for pain and visual disturbance.  Respiratory:  Positive for cough and shortness of breath.   Cardiovascular:  Negative for chest pain and palpitations.  Gastrointestinal:  Positive for constipation. Negative for abdominal pain and vomiting.  Genitourinary:  Negative for dysuria and hematuria.  Musculoskeletal:  Positive for arthralgias. Negative for back pain.  Skin:  Negative for color change and rash.  Neurological:  Positive for headaches. Negative for seizures and syncope.  All other systems reviewed and are negative.    Physical Exam Triage Vital Signs ED Triage Vitals  Encounter Vitals Group     BP 09/03/23 1808 132/60     Systolic BP Percentile --      Diastolic BP Percentile --      Pulse --      Resp 09/03/23 1808 16     Temp 09/03/23 1808 98.8 F (37.1 C)     Temp Source 09/03/23 1808  Oral     SpO2 09/03/23 1808 97 %     Weight --      Height --      Head Circumference --      Peak Flow --      Pain Score 09/03/23 1811 8     Pain Loc --      Pain Education --      Exclude from Growth Chart --    No data found.  Updated Vital Signs BP 132/60 (BP Location: Right Arm)   Temp 98.8 F (37.1 C) (Oral)  Resp 16   LMP 08/18/2023 (Approximate)   SpO2 97%   Visual Acuity Right Eye Distance:   Left Eye Distance:   Bilateral Distance:    Right Eye Near:   Left Eye Near:    Bilateral Near:     Physical Exam Vitals and nursing note reviewed.  Constitutional:      General: She is not in acute distress.    Appearance: She is well-developed.     Comments: Cough that is dry  HENT:     Head: Normocephalic and atraumatic.     Mouth/Throat:     Mouth: Mucous membranes are moist.  Eyes:     Extraocular Movements: Extraocular movements intact.     Conjunctiva/sclera: Conjunctivae normal.     Pupils: Pupils are equal, round, and reactive to light.  Cardiovascular:     Rate and Rhythm: Normal rate and regular rhythm.     Heart sounds: No murmur heard. Pulmonary:     Effort: Pulmonary effort is normal. No respiratory distress.     Breath sounds: Normal breath sounds.  Abdominal:     Palpations: Abdomen is soft.     Tenderness: There is no abdominal tenderness.  Musculoskeletal:        General: No swelling.     Cervical back: Neck supple.  Skin:    General: Skin is warm and dry.     Capillary Refill: Capillary refill takes less than 2 seconds.  Neurological:     Mental Status: She is alert.  Psychiatric:        Mood and Affect: Mood normal.     UC Treatments / Results  Labs (all labs ordered are listed, but only abnormal results are displayed) Labs Reviewed  SARS CORONAVIRUS 2 (TAT 6-24 HRS)  POCT INFLUENZA A/B    EKG   Radiology No results found.  Procedures Procedures (including critical care time)  Medications Ordered in UC Medications  - No data to display  Initial Impression / Assessment and Plan / UC Course  I have reviewed the triage vital signs and the nursing notes.  Pertinent labs & imaging results that were available during my care of the patient were reviewed by me and considered in my medical decision making (see chart for details).     Acute upper respiratory infection  Acute non-recurrent maxillary sinusitis   Sinus infection versus upper respiratory tract infection.  Symptoms have been slowly progressing over a week therefore we will treat with the following:  Augmentin 1 tablet twice daily for 10 days.  Promethazine-DM 5 mLs every 6 hours as needed for cough. Use caution as this medication can cause drowsiness.  Rest and stay hydrated  Return to urgent care or PCP if symptoms worsen or fail to resolve.    Final Clinical Impressions(s) / UC Diagnoses   Final diagnoses:  Acute upper respiratory infection  Acute non-recurrent maxillary sinusitis     Discharge Instructions      Sinus infection versus upper respiratory tract infection.  Symptoms have been slowly progressing over a week therefore we will treat with the following:  Augmentin 1 tablet twice daily for 10 days.  Promethazine-DM 5 mLs every 6 hours as needed for cough. Use caution as this medication can cause drowsiness.  Rest and stay hydrated  Return to urgent care or PCP if symptoms worsen or fail to resolve.       ED Prescriptions   None    PDMP not reviewed this encounter.   Doristine Mango  A, PA-C 09/03/23 1912

## 2023-09-03 NOTE — Discharge Instructions (Addendum)
Sinus infection versus upper respiratory tract infection.  We have tested you for COVID but these results will not be available until tomorrow and will be available in your MyChart.  If anything requires treatment we will contact you.  We have also tested you for flu today and those results show ---. Symptoms have been slowly progressing over a week therefore we will treat with the following:  Augmentin 1 tablet twice daily for 7 days. Take with food.  Promethazine-DM 5 mLs every 6 hours as needed for cough. Use caution as this medication can cause drowsiness.  Rest and stay hydrated  Return to urgent care or PCP if symptoms worsen or fail to resolve.

## 2023-09-03 NOTE — ED Triage Notes (Signed)
Patient c/o SOB, a non productive cough, general body aches, and a headache x 2-3 days.  Patient reports that she has taken cough drops and Ibuprofen with no relief.

## 2023-09-04 LAB — SARS CORONAVIRUS 2 (TAT 6-24 HRS): SARS Coronavirus 2: NEGATIVE

## 2023-09-05 ENCOUNTER — Encounter: Payer: Self-pay | Admitting: Family Medicine

## 2023-09-05 ENCOUNTER — Ambulatory Visit: Payer: Medicaid Other | Admitting: Student

## 2023-09-05 ENCOUNTER — Ambulatory Visit: Payer: Medicaid Other | Admitting: Physical Therapy

## 2023-09-05 ENCOUNTER — Ambulatory Visit: Payer: Medicaid Other | Admitting: Family Medicine

## 2023-09-05 VITALS — BP 123/60 | HR 94 | Temp 98.4°F | Ht 64.0 in | Wt 312.6 lb

## 2023-09-05 DIAGNOSIS — R509 Fever, unspecified: Secondary | ICD-10-CM | POA: Diagnosis not present

## 2023-09-05 LAB — POC SOFIA 2 FLU + SARS ANTIGEN FIA
Influenza A, POC: NEGATIVE
Influenza B, POC: NEGATIVE
SARS Coronavirus 2 Ag: NEGATIVE

## 2023-09-05 NOTE — Patient Instructions (Signed)
We have tested you again for COVID and flu. I will let you know those results. Continue your antibiotics as prescribed. Be sure to stay hydrated. Let me know if you continue to worsen or cannot hold down any fluids.

## 2023-09-05 NOTE — Progress Notes (Signed)
    SUBJECTIVE:   CHIEF COMPLAINT / HPI:   Sick symptoms Went to UC on Saturday for chills, body aches, dizziness, and headache. Had negative resp swab there. She has been taking augmentin, promethazine, and ibuprofen (last dose 11 AM) that they had given her. Had a fever yesterday to 101. She has been feeling weak still with chills and cough. She has been having SOB as well. She is mildly improved overall. Has felt nauseous but no vomiting. Little appetite. Has been drinking water. No bowel changes. Her brother is also sick in the home and was diagnosed with pneumonia.  OBJECTIVE:   BP 123/60   Pulse 94   Temp 98.4 F (36.9 C)   Ht 5\' 4"  (1.626 m)   Wt (!) 312 lb 9.6 oz (141.8 kg)   LMP 08/27/2023 (Approximate)   SpO2 99%   BMI 53.66 kg/m   General: Alert and oriented, in NAD Skin: Warm, dry, and intact without appreciable lesions HEENT: NCAT, EOM grossly normal, midline nasal septum, posterior oropharyngeal erythema present Cardiac: Regular rate, no murmurs appreciated Respiratory: Slightly diminished breath sounds at right lower lobe, otherwise clear, breathing and speaking comfortably on RA Abdominal: Nondistended, normoactive bowel sounds Extremities: Moves all extremities grossly equally Neurological: No gross focal deficit Psychiatric: Appropriate mood and affect   ASSESSMENT/PLAN:   Sick symptoms History and exam most concerning for viral infection versus mild evolving pneumonia.  Reassured by afebrile status, normal SpO2, normal work of breathing, and fair hydration on exam.  Will retest for flu/COVID. Continue conservative management with OTC cough medicines.  Recommend not taking prescribed promethazine if continues to make her dizzy/sleepy.  Continue Augmentin course.  Return to care if worsening or not improving.   Janeal Holmes, MD [facility]

## 2023-09-06 ENCOUNTER — Telehealth: Payer: Self-pay

## 2023-09-06 DIAGNOSIS — B351 Tinea unguium: Secondary | ICD-10-CM

## 2023-09-06 NOTE — Telephone Encounter (Signed)
Patient calls nurse line in regards to OV with [facility] back in August.   She reports she saw him on 8/8 for multiple concerns that day. She reports she mentioned to him that her finger had been bothering her. She reports he took a fingernail sample and was going to look under the microscope. She reports he was possibly going to send in medication depending on findings.   She reports she "kinda forgot all about it" until recently when her finger started bothering her again.   Patient advised to schedule another apt for evaluation as concern was ~ 3 months ago.   Patient scheduled for tomorrow in ATC.

## 2023-09-07 ENCOUNTER — Encounter: Payer: Self-pay | Admitting: Student

## 2023-09-07 ENCOUNTER — Ambulatory Visit: Payer: Medicaid Other

## 2023-09-07 MED ORDER — TERBINAFINE HCL 250 MG PO TABS
250.0000 mg | ORAL_TABLET | Freq: Every day | ORAL | 0 refills | Status: DC
Start: 2023-09-07 — End: 2023-09-09

## 2023-09-07 NOTE — Progress Notes (Deleted)
  SUBJECTIVE:   CHIEF COMPLAINT / HPI:   Concern for fungal nail infection on her***finger.  This was previously addressed by Dr. Marisue Humble on 03/25/2023.  LFTs were appropriate on CMP at that time.  PERTINENT  PMH / PSH: OSA, hidradenitis, prediabetes  OBJECTIVE:  LMP 08/27/2023 (Approximate)  Physical Exam   ASSESSMENT/PLAN:  There are no diagnoses linked to this encounter. No follow-ups on file. Shelby Mattocks, DO 09/07/2023, 7:49 AM PGY-***, [facility] {    This will disappear when note is signed, click to select method of visit    :1}

## 2023-09-08 DIAGNOSIS — F4311 Post-traumatic stress disorder, acute: Secondary | ICD-10-CM | POA: Diagnosis not present

## 2023-09-09 ENCOUNTER — Ambulatory Visit (HOSPITAL_COMMUNITY): Payer: Medicaid Other

## 2023-09-09 ENCOUNTER — Telehealth (HOSPITAL_COMMUNITY): Payer: Self-pay | Admitting: Family Medicine

## 2023-09-09 ENCOUNTER — Emergency Department (HOSPITAL_COMMUNITY)
Admission: EM | Admit: 2023-09-09 | Discharge: 2023-09-09 | Disposition: A | Discharge status: Home / Self Care | Payer: Medicaid Other | Attending: Emergency Medicine | Admitting: Emergency Medicine

## 2023-09-09 ENCOUNTER — Encounter (HOSPITAL_COMMUNITY): Payer: Self-pay | Admitting: Emergency Medicine

## 2023-09-09 ENCOUNTER — Encounter (HOSPITAL_COMMUNITY): Payer: Self-pay

## 2023-09-09 ENCOUNTER — Ambulatory Visit (HOSPITAL_COMMUNITY)
Admission: EM | Admit: 2023-09-09 | Discharge: 2023-09-09 | Disposition: A | Discharge status: Home / Self Care | Payer: Medicaid Other | Attending: Family Medicine | Admitting: Family Medicine

## 2023-09-09 DIAGNOSIS — J168 Pneumonia due to other specified infectious organisms: Secondary | ICD-10-CM | POA: Diagnosis not present

## 2023-09-09 DIAGNOSIS — J189 Pneumonia, unspecified organism: Secondary | ICD-10-CM | POA: Diagnosis not present

## 2023-09-09 DIAGNOSIS — R059 Cough, unspecified: Secondary | ICD-10-CM | POA: Diagnosis not present

## 2023-09-09 DIAGNOSIS — R0602 Shortness of breath: Secondary | ICD-10-CM | POA: Diagnosis not present

## 2023-09-09 DIAGNOSIS — R062 Wheezing: Secondary | ICD-10-CM | POA: Diagnosis not present

## 2023-09-09 DIAGNOSIS — R918 Other nonspecific abnormal finding of lung field: Secondary | ICD-10-CM | POA: Diagnosis not present

## 2023-09-09 LAB — COMPREHENSIVE METABOLIC PANEL
ALT: 33 U/L (ref 0–44)
AST: 21 U/L (ref 15–41)
Albumin: 3.3 g/dL — ABNORMAL LOW (ref 3.5–5.0)
Alkaline Phosphatase: 65 U/L (ref 38–126)
Anion gap: 5 (ref 5–15)
BUN: 7 mg/dL (ref 6–20)
CO2: 26 mmol/L (ref 22–32)
Calcium: 8.7 mg/dL — ABNORMAL LOW (ref 8.9–10.3)
Chloride: 103 mmol/L (ref 98–111)
Creatinine, Ser: 0.68 mg/dL (ref 0.44–1.00)
GFR, Estimated: >60 mL/min (ref >60)
Glucose, Bld: 113 mg/dL — ABNORMAL HIGH (ref 70–99)
Potassium: 4 mmol/L (ref 3.5–5.1)
Sodium: 134 mmol/L — ABNORMAL LOW (ref 135–145)
Total Bilirubin: 0.5 mg/dL (ref 0.3–1.2)
Total Protein: 7.1 g/dL (ref 6.5–8.1)

## 2023-09-09 LAB — CBC
HCT: 38.5 % (ref 36.0–46.0)
Hemoglobin: 12.3 g/dL (ref 12.0–15.0)
MCH: 25.9 pg — ABNORMAL LOW (ref 26.0–34.0)
MCHC: 31.9 g/dL (ref 30.0–36.0)
MCV: 81.2 fL (ref 80.0–100.0)
Platelets: 257 10*3/uL (ref 150–400)
RBC: 4.74 MIL/uL (ref 3.87–5.11)
RDW: 12.8 % (ref 11.5–15.5)
WBC: 8.6 10*3/uL (ref 4.0–10.5)
nRBC: 0 % (ref 0.0–0.2)

## 2023-09-09 MED ORDER — ACETAMINOPHEN 325 MG PO TABS
650.0000 mg | ORAL_TABLET | Freq: Once | ORAL | Status: AC
  Administered 2023-09-09: 650 mg via ORAL
  Filled 2023-09-09: qty 2

## 2023-09-09 MED ORDER — BENZONATATE 200 MG PO CAPS: 200.0000 mg | ORAL_CAPSULE | Freq: Three times a day (TID) | ORAL | 0 refills | Status: DC | PRN

## 2023-09-09 MED ORDER — ALBUTEROL SULFATE HFA 108 (90 BASE) MCG/ACT IN AERS: 2.0000 | INHALATION_SPRAY | RESPIRATORY_TRACT | 0 refills | Status: DC | PRN

## 2023-09-09 MED ORDER — LEVOFLOXACIN 500 MG PO TABS: 500.0000 mg | ORAL_TABLET | Freq: Every day | ORAL | 0 refills | Status: AC

## 2023-09-09 MED ORDER — LEVOFLOXACIN 750 MG PO TABS
750.0000 mg | ORAL_TABLET | Freq: Once | ORAL | Status: AC
  Administered 2023-09-09: 750 mg via ORAL
  Filled 2023-09-09: qty 1

## 2023-09-09 NOTE — ED Triage Notes (Signed)
Pt reports SOB, wheezing cough. Pt is currently of amoxicillin with no relief.

## 2023-09-09 NOTE — Telephone Encounter (Signed)
I did speak with the patient about her chest xray results.  I advised she give the medications I gave her about 24-48hrs to work/take affect, and if not helpful then go to the ER for evaluation.  However, she is very concerned about her symptoms, and therefor I have recommended she go to the ER today for evaluation.

## 2023-09-09 NOTE — ED Provider Notes (Signed)
[facility]    CSN: [id number] Arrival date & time: 09/09/23  1042      History   Chief Complaint No chief complaint on file.   HPI Lauren Obrien is a 22 y.o. female.   Patient is here for continued URI symptoms  She has a cough, hard to breath, sob at times.  She was seen about 1 week ago 10/19 for URI symptoms.  Flu/covid negative. Was not running a fever at that time.  The next day she had a fever, chills, cough, sob.  She last ran a fever last night of 100.  She did go back to work yesterday, but she was feeling worse.  She is still taking augmentin bid.  It is worse at night, esp the sob and cough.  No h/o asthma, but she usually get sob and heaviness when sick       Past Medical History:  Diagnosis Date   Acne vulgaris    Cough 11/17/2021   COVID-19 12/11/2020   Dizziness 08/23/2019   Folliculitis 08/29/2019   GERD (gastroesophageal reflux disease)    Helicobacter pylori gastritis    Hidradenitis suppurativa    Neuropathy    BUE   Pharyngitis 11/13/2019   Pre-diabetes    Routine screening for STI (sexually transmitted infection) 12/26/2020   Seasonal allergies    Sleep apnea    Sleep deprivation 09/28/2018   Snoring 01/22/2020   Sore throat 11/13/2019   Strep pharyngitis 05/29/2022   Viral URI 02/01/2020    Patient Active Problem List   Diagnosis Date Noted   Chronic midline low back pain without sciatica 06/10/2023   Nail discoloration 03/28/2023   Attempted IUD removal, unsuccessful 11/17/2022   Umbilical discharge 05/04/2022   Paresthesias 03/20/2022   Right wrist tendinitis 06/06/2020   Morbid obesity (HCC) 01/22/2020   Chronic fatigue 01/22/2020   Excessive daytime sleepiness 01/22/2020   OSA (obstructive sleep apnea) 01/22/2020   Oropharyngeal dysphagia 01/11/2020   Prediabetes 08/29/2019   Dizziness 08/23/2019   Memory difficulties 07/01/2019   Chronic tension type headache 05/29/2019   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   Hidradenitis suppurativa 03/04/2019   Carpal tunnel syndrome of left wrist 01/04/2019   Learning difficulty 12/16/2018   Left wrist tendinitis 12/01/2018   Irregular menstrual bleeding 04/03/2018   Acid reflux 01/21/2016   Chronic constipation 07/24/2015    Past Surgical History:  Procedure Laterality Date   NO PAST SURGERIES     TOOTH EXTRACTION Bilateral 08/25/2021   Procedure: DENTAL RESTORATION/EXTRACTIONS;  Surgeon: Ocie Doyne, DMD;  Location: [facility];  Service: Oral Surgery;  Laterality: Bilateral;    OB History     Gravida  2   Para  1   Term  1   Preterm  0   AB  0   Living  1      SAB  0   IAB  0   Ectopic  0   Multiple      Live Births  1            Home Medications    Prior to Admission medications   Medication Sig Start Date End Date Taking? Authorizing Provider  amoxicillin-clavulanate (AUGMENTIN) 875-125 MG tablet Take 1 tablet by mouth every 12 (twelve) hours. 09/03/23  Yes White, Elizabeth A, PA-C  cetirizine (ZYRTEC) 10 MG tablet Take 1 tablet (10 mg total) by mouth daily as needed for allergies. 10/15/22  Yes Jerre Simon, MD  famotidine (  PEPCID) 20 MG tablet Take 1 tablet (20 mg total) by mouth daily. 03/29/23  Yes Shelby Mattocks, DO  fluticasone (FLONASE) 50 MCG/ACT nasal spray PLACE 2 SPRAYS INTO BOTH NOSTRILS DAILY AS NEEDED 03/17/23  Yes Lockie Mola, MD  omeprazole (PRILOSEC) 40 MG capsule Take 1 capsule (40 mg total) by mouth daily. 05/21/22  Yes Jerre Simon, MD  promethazine-dextromethorphan (PROMETHAZINE-DM) 6.25-15 MG/5ML syrup Take 5 mLs by mouth every 6 (six) hours as needed for cough. 09/03/23  Yes White, Elizabeth A, PA-C  terbinafine (LAMISIL) 250 MG tablet Take 1 tablet (250 mg total) by mouth daily. 09/07/23   Alicia Amel, MD    Family History Family History  Problem Relation Age of Onset   Diabetes Father    Hypertension Father    Asthma Maternal Grandmother     Diabetes Maternal Grandmother    Diabetes Maternal Grandfather    Diabetes Paternal Grandmother    Diabetes Paternal Grandfather    Colon cancer Neg Hx    Esophageal cancer Neg Hx    Pancreatic cancer Neg Hx    Stomach cancer Neg Hx    Liver disease Neg Hx     Social History Social History   Tobacco Use   Smoking status: Never    Passive exposure: Never   Smokeless tobacco: Never  Vaping Use   Vaping status: Never Used  Substance Use Topics   Alcohol use: No    Alcohol/week: 0.0 standard drinks of alcohol   Drug use: No     Allergies   Patient has no known allergies.   Review of Systems Review of Systems  Constitutional:  Positive for fatigue.  HENT:  Positive for congestion and rhinorrhea.   Respiratory:  Positive for cough, shortness of breath and wheezing.   Cardiovascular: Negative.   Gastrointestinal: Negative.   Genitourinary: Negative.   Musculoskeletal: Negative.   Psychiatric/Behavioral: Negative.       Physical Exam Triage Vital Signs ED Triage Vitals  Encounter Vitals Group     BP 09/09/23 1136 138/87     Systolic BP Percentile --      Diastolic BP Percentile --      Pulse Rate 09/09/23 1134 100     Resp 09/09/23 1134 18     Temp 09/09/23 1134 98.5 F (36.9 C)     Temp Source 09/09/23 1134 Oral     SpO2 09/09/23 1134 95 %     Weight --      Height --      Head Circumference --      Peak Flow --      Pain Score 09/09/23 1137 6     Pain Loc --      Pain Education --      Exclude from Growth Chart --    No data found.  Updated Vital Signs BP 138/87   Pulse 100   Temp 98.5 F (36.9 C) (Oral)   Resp 18   LMP 08/27/2023 (Approximate)   SpO2 95%   Visual Acuity Right Eye Distance:   Left Eye Distance:   Bilateral Distance:    Right Eye Near:   Left Eye Near:    Bilateral Near:     Physical Exam Constitutional:      General: She is not in acute distress.    Appearance: Normal appearance. She is normal weight. She is not  ill-appearing.  HENT:     Nose: Congestion present.     Mouth/Throat:     Mouth:  Mucous membranes are moist.     Pharynx: No posterior oropharyngeal erythema.  Cardiovascular:     Rate and Rhythm: Normal rate and regular rhythm.  Pulmonary:     Effort: Pulmonary effort is normal.     Breath sounds: Normal breath sounds.  Musculoskeletal:     Cervical back: Normal range of motion and neck supple.  Skin:    General: Skin is warm.  Neurological:     General: No focal deficit present.     Mental Status: She is alert.  Psychiatric:        Mood and Affect: Mood normal.      UC Treatments / Results  Labs (all labs ordered are listed, but only abnormal results are displayed) Labs Reviewed - No data to display  EKG   Radiology No results found.  Procedures Procedures (including critical care time)  Medications Ordered in UC Medications - No data to display  Initial Impression / Assessment and Plan / UC Course  I have reviewed the triage vital signs and the nursing notes.  Pertinent labs & imaging results that were available during my care of the patient were reviewed by me and considered in my medical decision making (see chart for details).  Final Clinical Impressions(s) / UC Diagnoses   Final diagnoses:  Pneumonia of right middle lobe due to infectious organism     Discharge Instructions      You were seen today for continued cough.  I am concerned about a possible pneumonia on chest xray.  I have sent out an antibiotic for you. You may stop the augmentin.  I have sent out an inhaler and cough medication as well.  Please get plenty of rest and fluids, as well as tylenol for pain and fever.  Please return if not improving.     ED Prescriptions     Medication Sig Dispense Auth. Provider   benzonatate (TESSALON) 200 MG capsule Take 1 capsule (200 mg total) by mouth 3 (three) times daily as needed for cough. 21 capsule Maziah Smola, MD   albuterol (VENTOLIN  HFA) 108 (90 Base) MCG/ACT inhaler Inhale 2 puffs into the lungs every 4 (four) hours as needed for wheezing or shortness of breath. 1 each Nikitha Mode, MD   levofloxacin (LEVAQUIN) 500 MG tablet Take 1 tablet (500 mg total) by mouth daily for 7 days. 7 tablet Jannifer Franklin, MD      PDMP not reviewed this encounter.   Jannifer Franklin, MD 09/09/23 1226

## 2023-09-09 NOTE — Discharge Instructions (Addendum)
You have pneumonia.  This will take several days before it improves.  You can take oral antibiotics.  You received a dose of Levaquin here in the emergency department.  Pick up your prescription for this tomorrow and begin taking it tomorrow.  You can take over-the-counter cough medications.  The physician that you saw also prescribed you Tessalon Perles.  These are anticough medications.

## 2023-09-09 NOTE — Discharge Instructions (Signed)
You were seen today for continued cough.  I am concerned about a possible pneumonia on chest xray.  I have sent out an antibiotic for you. You may stop the augmentin.  I have sent out an inhaler and cough medication as well.  Please get plenty of rest and fluids, as well as tylenol for pain and fever.  Please return if not improving.

## 2023-09-09 NOTE — ED Triage Notes (Signed)
Pt here from UC dx with PNA and prescribed antibiotics today , pt saw her x ray and thought that she may need a ct of her chest ,

## 2023-09-09 NOTE — ED Provider Notes (Signed)
[place] EMERGENCY DEPARTMENT AT [facility] Provider Note   CSN: [id number] Arrival date & time: 09/09/23  1605     History  Chief Complaint  Patient presents with   Cough   Shortness of Breath    Lauren Obrien is a 22 y.o. female.  22 year old female here today because she is concerned that she might have a collapsed lung because her x-ray report question whether or not she had a collapsed lung.  Patient was recently diagnosed with pneumonia.  She has not started taking the Levaquin that she was prescribed.   Cough Associated symptoms: shortness of breath   Shortness of Breath Associated symptoms: cough        Home Medications Prior to Admission medications   Medication Sig Start Date End Date Taking? Authorizing Provider  albuterol (VENTOLIN HFA) 108 (90 Base) MCG/ACT inhaler Inhale 2 puffs into the lungs every 4 (four) hours as needed for wheezing or shortness of breath. 09/09/23   Piontek, Denny Peon, MD  benzonatate (TESSALON) 200 MG capsule Take 1 capsule (200 mg total) by mouth 3 (three) times daily as needed for cough. 09/09/23   Piontek, Denny Peon, MD  cetirizine (ZYRTEC) 10 MG tablet Take 1 tablet (10 mg total) by mouth daily as needed for allergies. 10/15/22   Jerre Simon, MD  fluticasone [facility]) 50 MCG/ACT nasal spray PLACE 2 SPRAYS INTO BOTH NOSTRILS DAILY AS NEEDED 03/17/23   Lockie Mola, MD  levofloxacin (LEVAQUIN) 500 MG tablet Take 1 tablet (500 mg total) by mouth daily for 7 days. 09/09/23 09/16/23  Jannifer Franklin, MD  promethazine-dextromethorphan (PROMETHAZINE-DM) 6.25-15 MG/5ML syrup Take 5 mLs by mouth every 6 (six) hours as needed for cough. 09/03/23   Landis Martins, PA-C      Allergies    Patient has no known allergies.    Review of Systems   Review of Systems  Respiratory:  Positive for cough and shortness of breath.     Physical Exam Updated Vital Signs BP 133/72 (BP Location: Right Arm)   Pulse (!) 105   Temp 98.9 F (37.2  C)   Resp 18   LMP 08/27/2023 (Approximate)   SpO2 96%  Physical Exam Vitals reviewed.  Pulmonary:     Effort: Pulmonary effort is normal.     Breath sounds: No decreased breath sounds.     ED Results / Procedures / Treatments   Labs (all labs ordered are listed, but only abnormal results are displayed) Labs Reviewed  CBC - Abnormal; Notable for the following components:      Result Value   MCH 25.9 (*)    All other components within normal limits  COMPREHENSIVE METABOLIC PANEL - Abnormal; Notable for the following components:   Sodium 134 (*)    Glucose, Bld 113 (*)    Calcium 8.7 (*)    Albumin 3.3 (*)    All other components within normal limits    EKG None  Radiology DG Chest 2 View  Result Date: 09/09/2023 CLINICAL DATA:  Cough and shortness of breath.  Wheezing. EXAM: CHEST - 2 VIEW COMPARISON:  None Available. FINDINGS: Cardiac silhouette and mediastinal contours are within normal limits. On lateral view there is homogeneous triangular density overlying the posterior costophrenic angle. This has sharp angles with the left and right hemidiaphragms, does not silhouette the diaphragm, and does not appear to represent a pleural effusion. It may represent atelectasis. It may represent high-grade collapse of the left lower lobe. There may be corresponding subtle  density overlying the inferior medial left lung on frontal view. No pneumothorax.  No acute skeletal abnormality. IMPRESSION: On lateral view there is homogeneous triangular density overlying the posterior costophrenic angle. This may represent atelectasis. It may represent high-grade collapse of the left lower lobe. Consider further evaluation with chest CT. Electronically Signed   By: Neita Garnet M.D.   On: 09/09/2023 13:28    Procedures Ultrasound ED Thoracic  Date/Time: 09/09/2023 9:48 PM  Performed by: Arletha Pili, DO Authorized by: Arletha Pili, DO   Procedure details:    Indications: dyspnea      Assessment for:  Pneumothorax   Left lung pleural:  Visualized   Right lung pleural:  Not visualized   Images: archived   Findings:    A-lines noted throughout: not identified     B-lines noted throughout: not identified   Left Lung Findings:     left lung sliding    no left lung consolidation    no left lung pleural effusion Impression:    Impression: none       Medications Ordered in ED Medications  levofloxacin (LEVAQUIN) tablet 750 mg (has no administration in time range)  acetaminophen (TYLENOL) tablet 650 mg (650 mg Oral Given 09/09/23 1706)    ED Course/ Medical Decision Making/ A&P                                 Medical Decision Making 22 year old female here today because she was concerned about a read on her radiology imaging.  Plan-patient has good lung sliding.  She has breath sounds equal bilaterally.  I would like to the patient's chest x-ray, and combining this with the patient's clinical appearance, this is not a pneumothorax.  Counseled the patient on this.  Will provide her with oral Levaquin.  She already has a prescription that she will pick up tomorrow.  Will discharge home.  Amount and/or Complexity of Data Reviewed Labs: ordered.  Risk OTC drugs.           Final Clinical Impression(s) / ED Diagnoses Final diagnoses:  Community acquired pneumonia of right lung, unspecified part of lung    Rx / DC Orders ED Discharge Orders     None         Arletha Pili, DO 09/09/23 2150

## 2023-09-12 ENCOUNTER — Ambulatory Visit: Payer: Medicaid Other | Admitting: Physical Therapy

## 2023-09-13 ENCOUNTER — Ambulatory Visit: Payer: Medicaid Other

## 2023-09-13 DIAGNOSIS — R42 Dizziness and giddiness: Secondary | ICD-10-CM

## 2023-09-13 DIAGNOSIS — M542 Cervicalgia: Secondary | ICD-10-CM

## 2023-09-13 NOTE — Therapy (Signed)
OUTPATIENT PHYSICAL THERAPY VESTIBULAR TREATMENT     Patient Name: Lauren Obrien MRN: [id number] DOB:04-Apr-2001, 22 y.o., female Today's Date: 09/13/2023  END OF SESSION:  PT End of Session - 09/13/23 1502     Visit Number 3    Number of Visits 12    Date for PT Re-Evaluation 11/14/23    Authorization Type [employer]    PT Start Time 1502    PT Stop Time 1545    PT Time Calculation (min) 43 min    Activity Tolerance Patient tolerated treatment well    Behavior During Therapy [facility] for tasks assessed/performed              Past Medical History:  Diagnosis Date   Acne vulgaris    Cough 11/17/2021   COVID-19 12/11/2020   Dizziness 08/23/2019   Folliculitis 08/29/2019   GERD (gastroesophageal reflux disease)    Helicobacter pylori gastritis    Hidradenitis suppurativa    Neuropathy    BUE   Pharyngitis 11/13/2019   Pre-diabetes    Routine screening for STI (sexually transmitted infection) 12/26/2020   Seasonal allergies    Sleep apnea    Sleep deprivation 09/28/2018   Snoring 01/22/2020   Sore throat 11/13/2019   Strep pharyngitis 05/29/2022   Viral URI 02/01/2020   Past Surgical History:  Procedure Laterality Date   NO PAST SURGERIES     TOOTH EXTRACTION Bilateral 08/25/2021   Procedure: DENTAL RESTORATION/EXTRACTIONS;  Surgeon: Ocie Doyne, DMD;  Location: [facility];  Service: Oral Surgery;  Laterality: Bilateral;   Patient Active Problem List   Diagnosis Date Noted   Chronic midline low back pain without sciatica 06/10/2023   Nail discoloration 03/28/2023   Attempted IUD removal, unsuccessful 11/17/2022   Umbilical discharge 05/04/2022   Paresthesias 03/20/2022   Right wrist tendinitis 06/06/2020   Morbid obesity (HCC) 01/22/2020   Chronic fatigue 01/22/2020   Excessive daytime sleepiness 01/22/2020   OSA (obstructive sleep apnea) 01/22/2020   Oropharyngeal dysphagia 01/11/2020   Prediabetes 08/29/2019   Dizziness  08/23/2019   Memory difficulties 07/01/2019   Chronic tension type headache 05/29/2019   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   Hidradenitis suppurativa 03/04/2019   Carpal tunnel syndrome of left wrist 01/04/2019   Learning difficulty 12/16/2018   Left wrist tendinitis 12/01/2018   Irregular menstrual bleeding 04/03/2018   Acid reflux 01/21/2016   Chronic constipation 07/24/2015    PCP: Marsh Dolly, MD REFERRING PROVIDER: Terisa Starr, MD  REFERRING DIAG: vertigo, dizziness  THERAPY DIAG:  Cervicalgia  Dizziness and giddiness  ONSET DATE: 08/13/23  Rationale for Evaluation and Treatment: Rehabilitation  SUBJECTIVE:   SUBJECTIVE STATEMENT: I was sick but am getting a little bit better. Still getting dizzy. I have pneumonia and the medication they gave me is making me more dizzy so you stopped taking it.    Pt accompanied by: self  PERTINENT HISTORY: GERD  PAIN:  Are you having pain? Yes: NPRS scale: 2/10 Pain location: upper traps and neck Pain description: tight, HA Aggravating factors: stress Relieving factors: rest  PRECAUTIONS: None  RED FLAGS: None   WEIGHT BEARING RESTRICTIONS: No  FALLS: Has patient fallen in last 6 months? 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  DIAGNOSTIC FINDINGS: none  COGNITION: Overall cognitive status: Within functional limits for tasks assessed   SENSATION: WFL  PALPATION:  Very tight and tender in the upper traps  POSTURE:  rounded shoulders and forward head  Cervical ROM:  all motions increased dizziness a little  Active A/PROM (deg) eval  Flexion WNL  Extension Decreased 25%  Right  lateral flexion WNL  Left lateral flexion WNL  Right rotation WNL  Left rotation WNL  (Blank rows = not tested)  STRENGTH: UE strength 4-/5  VESTIBULAR ASSESSMENT:  DHI:  58%  SYMPTOM BEHAVIOR:   Non-Vestibular symptoms: neck pain and headaches  Type of dizziness: Lightheadedness/Faint  Frequency: everyday  Duration: minutes to hours  Aggravating factors: Worse in the morning and bending forward, stress, getting up in the morning  Relieving factors: closing eyes and slow movements  Progression of symptoms: unchanged  OCULOMOTOR EXAM:  Ocular Alignment: normal  Ocular ROM: No Limitations  Spontaneous Nystagmus: absent  Gaze-Induced Nystagmus: absent  Smooth Pursuits: saccades mild  Saccades: dysmetria  Convergence/Divergence:  cm   VESTIBULAR - OCULAR REFLEX:   Slow VOR: Positive Bilaterally  VOR Cancellation: Unable to Maintain Gaze    POSITIONAL TESTING: Right Dix-Hallpike: no nystagmus Left Dix-Hallpike: no nystagmus  MOTION SENSITIVITY:  Reports that she would rate her dizziness a 2-3/10 most times but does increase with bending forward, quick movements and turns   OTHOSTATICS: not done  VESTIBULAR TREATMENT:                                                                                                   DATE:  09/13/23 VOR x1  VOR x2 Functional gait assessment  Education about vestibular system and how VOR exercises will be beneficial  Standing on airex EO, EC, head turns    08/29/23 Dix halpike negative bilaterally, she felt supported and did not get dizzy BP sitting 134/110 BP standing 130/100 BP Supine  130/100 Reviewed VOR, performed 2x Education on the VOR, vs BPVVV, vs orthostatics, she has a lot of questions that we went over   PATIENT EDUCATION: Education details: HEP and POC Person educated: Patient Education method: Programmer, multimedia, Facilities manager, Verbal cues, and Handouts Education comprehension: verbalized understanding  HOME EXERCISE  PROGRAM: VOR exercises   GOALS: Goals reviewed with patient? Yes  SHORT TERM GOALS: Target date: 08/27/23  Independent with initial HEP Goal status: IN PROGRESS   LONG TERM GOALS: Target date: 11/14/23  Independent with advanced HEP Goal status: INITIAL  2.  Assess BPPV again and orthostatics Goal status: MET 08/29/23  3.  Report that she has 50% less dizziness with getting up in the AM Goal status: IN PROGRESS  4.  No dizziness with looking up or down Goal status: IN PROGRESS   ASSESSMENT:  CLINICAL IMPRESSION: Patient is a 22 y.o. female who was seen today for physical therapy treatment for dizziness. She reports that she has been sick and dealing with pneumonia. She continues to have complaints of dizziness. Worked on some light exercises starting with VOR, which increases her dizziness but does  not last more than a few seconds. She admits to not doing HEP at home because it makes her dizzy, was educated that this is normal and that it requires her to build up her tolerance.   OBJECTIVE IMPAIRMENTS: Abnormal gait, decreased activity tolerance, decreased balance, decreased coordination, decreased endurance, decreased mobility, difficulty walking, decreased ROM, decreased strength, dizziness, increased muscle spasms, impaired flexibility, impaired vision/preception, improper body mechanics, postural dysfunction, and pain.   REHAB POTENTIAL: Good  CLINICAL DECISION MAKING: Stable/uncomplicated  EVALUATION COMPLEXITY: Low   PLAN:  PT FREQUENCY: 1-2x/week  PT DURATION: 12 weeks  PLANNED INTERVENTIONS: Therapeutic exercises, Therapeutic activity, Neuromuscular re-education, Balance training, Gait training, Patient/Family education, Self Care, Joint mobilization, Vestibular training, Canalith repositioning, Visual/preceptual remediation/compensation, Dry Needling, Electrical stimulation, Spinal mobilization, Cryotherapy, Moist heat, Taping, Traction, Ultrasound, and Manual  therapy  PLAN FOR NEXT SESSION: continue to assess VOR vs BPPV vs orthostatic issues, help with neck spasms   Cassie Freer, PT 09/13/2023, 3:41 PM

## 2023-09-15 DIAGNOSIS — F4311 Post-traumatic stress disorder, acute: Secondary | ICD-10-CM | POA: Diagnosis not present

## 2023-09-19 ENCOUNTER — Ambulatory Visit: Payer: Medicaid Other | Admitting: Physical Therapy

## 2023-09-20 ENCOUNTER — Ambulatory Visit: Payer: Medicaid Other | Admitting: Student

## 2023-09-20 ENCOUNTER — Encounter: Payer: Self-pay | Admitting: Student

## 2023-09-20 VITALS — BP 124/80 | HR 86 | Wt 312.4 lb

## 2023-09-20 DIAGNOSIS — R051 Acute cough: Secondary | ICD-10-CM

## 2023-09-20 MED ORDER — GUAIFENESIN ER 600 MG PO TB12
600.0000 mg | ORAL_TABLET | Freq: Two times a day (BID) | ORAL | 0 refills | Status: AC
Start: 2023-09-20 — End: 2023-09-25

## 2023-09-20 NOTE — Patient Instructions (Addendum)
It was wonderful to see you today. Thank you for allowing me to be a part of your care. Below is a short summary of what we discussed at your visit today:  Your lungs exam today was good and reassuring.  For your cough I recommend use of warm water, honey and lemon.  I have sent in Mucinex to help with clearing the phlegm if you feel like you are still having phlegm.  If you have any questions or concerns, please do not hesitate to contact us via phone or MyChart message.   Jerre Simon, MD [facility]

## 2023-09-20 NOTE — Progress Notes (Signed)
    SUBJECTIVE:   CHIEF COMPLAINT / HPI:   - 22 year old female presenting today for ED follow-up - Recently seen in ED for patients concern of possible collapsed lung - CXR was negative for pneumothorax and reported to have clear breath sounds bilaterally and sent home on levaquin - Patient went to the ED after she was told at urgent care that she could have had a collapsed lung due to diminished breath sounds on exam - Prior to urgent care patient had viral URI symptoms and started on antibiotics with Augmentin for concerns of pneumonia - Has completed her antibiotics - She denies any fever, chills,or chest pain since ED visit - Still endorses mild SOB or dry cough - No history of tobacco use or vaping.  PERTINENT  PMH / PSH: Reviewed   OBJECTIVE:   BP 124/80   Pulse 86   Wt (!) 312 lb 6.4 oz (141.7 kg)   LMP 08/27/2023 (Approximate)   SpO2 98%   BMI 53.62 kg/m    Physical Exam General: Alert, well appearing, NAD Cardiovascular: RRR, No Murmurs, Normal S2/S2 Respiratory: CTAB, No wheezing or Rales Abdomen: No distension or tenderness Extremities: No edema on extremities   Skin: Warm and dry  ASSESSMENT/PLAN:   Cough Suspect patient's findings more consistent with pneumonia rather than pneumothorax.  Given she has completed antibiotics and has denied any fever chills or infectious process, provided patient with reassurance.  Informed patient that postviral pneumonia cough could last up to 4 to 6 weeks.  Aunt in prescription for Mucinex to help with clearing of phlegm.  Return precautions discussed with patient and if cough continues for another 4 weeks we will consider repeat checks x-ray.   Jerre Simon, MD [facility]

## 2023-09-22 DIAGNOSIS — F4311 Post-traumatic stress disorder, acute: Secondary | ICD-10-CM | POA: Diagnosis not present

## 2023-09-26 ENCOUNTER — Ambulatory Visit (INDEPENDENT_AMBULATORY_CARE_PROVIDER_SITE_OTHER): Payer: Medicaid Other | Admitting: Family Medicine

## 2023-09-26 VITALS — BP 127/98 | HR 90 | Wt 309.4 lb

## 2023-09-26 DIAGNOSIS — N898 Other specified noninflammatory disorders of vagina: Secondary | ICD-10-CM

## 2023-09-26 LAB — POCT WET PREP (WET MOUNT)
Clue Cells Wet Prep Whiff POC: NEGATIVE
Trichomonas Wet Prep HPF POC: ABSENT
WBC, Wet Prep HPF POC: >20

## 2023-09-26 MED ORDER — FLUCONAZOLE 150 MG PO TABS
150.0000 mg | ORAL_TABLET | Freq: Once | ORAL | 0 refills | Status: AC
Start: 2023-09-26 — End: 2023-09-26

## 2023-09-26 NOTE — Progress Notes (Signed)
    SUBJECTIVE:   CHIEF COMPLAINT / HPI:   Vaginal itching  Patient reports vaginal irritation, itching and thin white discharge since Friday, 3 days ago.  Not sexually active  Recently treated with levaquin for pneumonia and finished treatment about one week ago.  No abdominal pain. She has only had a yeast infection once very remote past.     PERTINENT  PMH / PSH: OSA, HS, h/o Prediabetes   OBJECTIVE:   BP (!) 127/98   Pulse 90   Wt (!) 309 lb 6.4 oz (140.3 kg)   LMP 08/27/2023 (Approximate)   SpO2 100%   BMI 53.11 kg/m   General: well appearing, in no acute distress CV: RRR, radial pulses equal and palpable, no BLE edema  Resp: Normal work of breathing on room air, CTAB Abd: Soft, non tender, non distended  GU: (chaperoned by CMA), some hyperpigmented scars in perineum, white discharge in the back of introitus, no CMT   ASSESSMENT/PLAN:   Assessment & Plan Vaginal irritation Concern for yeast infection given history of prediabetes, obesity, and recent antibiotic use. No concern for STI given not sexually active.  - wet prep  - diflucan       Lockie Mola, MD [facility]

## 2023-09-26 NOTE — Patient Instructions (Signed)
It was wonderful to see you today.  Please bring ALL of your medications with you to every visit.   Today we talked about:  Vaginal irritation - This is most likely due to a yeast infection. Some people get these after taking antibiotics. I have sent a medication to treat this. Take one and then 2 days later if you still have symptoms you can take the other medication. I will let you know regarding the results.   Thank you for choosing [facility].   Please call [phone] with any questions about today's appointment.  Lockie Mola, MD  Family Medicine

## 2023-09-28 ENCOUNTER — Telehealth: Payer: Self-pay

## 2023-09-28 NOTE — Therapy (Signed)
OUTPATIENT PHYSICAL THERAPY VESTIBULAR TREATMENT     Patient Name: Lauren Obrien MRN: [id number] DOB:November 27, 2000, 22 y.o., female Today's Date: 09/29/2023  END OF SESSION:  PT End of Session - 09/29/23 1625     Visit Number 4    Number of Visits 12    Date for PT Re-Evaluation 10/18/23    Authorization Type [employer]    Authorization - Visit Number 1    Authorization - Number of Visits 4    PT Start Time 1630    PT Stop Time 1715    PT Time Calculation (min) 45 min    Activity Tolerance Patient tolerated treatment well    Behavior During Therapy [facility] for tasks assessed/performed               Past Medical History:  Diagnosis Date   Acne vulgaris    Cough 11/17/2021   COVID-19 12/11/2020   Dizziness 08/23/2019   Folliculitis 08/29/2019   GERD (gastroesophageal reflux disease)    Helicobacter pylori gastritis    Hidradenitis suppurativa    Neuropathy    BUE   Pharyngitis 11/13/2019   Pre-diabetes    Routine screening for STI (sexually transmitted infection) 12/26/2020   Seasonal allergies    Sleep apnea    Sleep deprivation 09/28/2018   Snoring 01/22/2020   Sore throat 11/13/2019   Strep pharyngitis 05/29/2022   Viral URI 02/01/2020   Past Surgical History:  Procedure Laterality Date   NO PAST SURGERIES     TOOTH EXTRACTION Bilateral 08/25/2021   Procedure: DENTAL RESTORATION/EXTRACTIONS;  Surgeon: Ocie Doyne, DMD;  Location: [facility];  Service: Oral Surgery;  Laterality: Bilateral;   Patient Active Problem List   Diagnosis Date Noted   Chronic midline low back pain without sciatica 06/10/2023   Nail discoloration 03/28/2023   Attempted IUD removal, unsuccessful 11/17/2022   Umbilical discharge 05/04/2022   Paresthesias 03/20/2022   Right wrist tendinitis 06/06/2020   Morbid obesity (HCC) 01/22/2020   Chronic fatigue 01/22/2020   Excessive daytime sleepiness 01/22/2020   OSA (obstructive sleep apnea) 01/22/2020    Oropharyngeal dysphagia 01/11/2020   Prediabetes 08/29/2019   Dizziness 08/23/2019   Memory difficulties 07/01/2019   Chronic tension type headache 05/29/2019   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   Hidradenitis suppurativa 03/04/2019   Carpal tunnel syndrome of left wrist 01/04/2019   Learning difficulty 12/16/2018   Left wrist tendinitis 12/01/2018   Irregular menstrual bleeding 04/03/2018   Acid reflux 01/21/2016   Chronic constipation 07/24/2015    PCP: Marsh Dolly, MD REFERRING PROVIDER: Terisa Starr, MD  REFERRING DIAG: vertigo, dizziness  THERAPY DIAG:  Cervicalgia  Dizziness and giddiness  ONSET DATE: 08/13/23  Rationale for Evaluation and Treatment: Rehabilitation  SUBJECTIVE:   SUBJECTIVE STATEMENT: Not feeling good today, I have a lot of pain in my feet and knees. So I am really tired. Mostly still getting dizzy in the mornings. I think I am about to get a migraine.   Pt accompanied by: self  PERTINENT HISTORY: GERD  PAIN:  Are you having pain? Yes: NPRS scale: 8/10 Pain location: upper traps and neck Pain description: tight, HA Aggravating factors: stress Relieving factors: rest  PRECAUTIONS: None  RED FLAGS: None   WEIGHT BEARING RESTRICTIONS: No  FALLS: Has patient fallen in last 6 months? 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  DIAGNOSTIC FINDINGS: none  COGNITION: Overall cognitive status: Within functional limits for tasks assessed   SENSATION: WFL  PALPATION:  Very tight and tender in the upper traps  POSTURE:  rounded shoulders and forward head  Cervical ROM:  all motions increased dizziness a  little  Active A/PROM (deg) eval  Flexion WNL  Extension Decreased 25%  Right lateral flexion WNL  Left lateral flexion WNL  Right rotation WNL  Left rotation WNL  (Blank rows = not tested)  STRENGTH: UE strength 4-/5  VESTIBULAR ASSESSMENT:  DHI:  58%  SYMPTOM BEHAVIOR:   Non-Vestibular symptoms: neck pain and headaches  Type of dizziness: Lightheadedness/Faint  Frequency: everyday  Duration: minutes to hours  Aggravating factors: Worse in the morning and bending forward, stress, getting up in the morning  Relieving factors: closing eyes and slow movements  Progression of symptoms: unchanged  OCULOMOTOR EXAM:  Ocular Alignment: normal  Ocular ROM: No Limitations  Spontaneous Nystagmus: absent  Gaze-Induced Nystagmus: absent  Smooth Pursuits: saccades mild  Saccades: dysmetria  Convergence/Divergence:  cm   VESTIBULAR - OCULAR REFLEX:   Slow VOR: Positive Bilaterally  VOR Cancellation: Unable to Maintain Gaze    POSITIONAL TESTING: Right Dix-Hallpike: no nystagmus Left Dix-Hallpike: no nystagmus  MOTION SENSITIVITY:  Reports that she would rate her dizziness a 2-3/10 most times but does increase with bending forward, quick movements and turns   OTHOSTATICS: not done  VESTIBULAR TREATMENT:                                                                                                   DATE:  09/29/23 NuStep L4 x5mins  Calf stretch on slant 15s x3  Calf raises 2x10 VOR x1 and x2 seated Standing on airex EO, EC, head turns   09/13/23 VOR x1  VOR x2 Functional gait assessment  Education about vestibular system and how VOR exercises will be beneficial  Standing on airex EO, EC, head turns    08/29/23 Dix halpike negative bilaterally, she felt supported and did not get dizzy BP sitting 134/110 BP standing 130/100 BP Supine  130/100 Reviewed VOR, performed 2x Education on the VOR, vs BPVVV, vs orthostatics, she has a lot of questions that we went  over   PATIENT EDUCATION: Education details: HEP and POC Person educated: Patient Education method: Programmer, multimedia, Facilities manager, Verbal cues, and Handouts Education comprehension: verbalized understanding  HOME EXERCISE PROGRAM: VOR exercises   GOALS: Goals reviewed with patient? Yes  SHORT TERM GOALS: Target date: 08/27/23  Independent with initial HEP Goal status: IN PROGRESS   LONG TERM GOALS: Target date: 11/14/23  Independent with advanced HEP Goal status: INITIAL  2.  Assess BPPV again and orthostatics Goal status: MET 08/29/23  3.  Report that she has 50% less dizziness with getting up in the AM Goal status: IN PROGRESS  4.  No dizziness with looking up or down Goal status: IN PROGRESS   ASSESSMENT:  CLINICAL IMPRESSION: Patient is a 22 y.o. female who was seen today for physical therapy treatment for dizziness. However today she reports that she is having a lot of pain in her knees and feet. She states that after the NuStep and calf stretching, her foot pain felt better. Worked on some VOR exercises, which increases her dizziness but does not last more than a few seconds. VOR x2 increases her symptoms quicker, she says she does not like this one because it makes her dizzy. Patient feels nauseous with head turns on airex.   OBJECTIVE IMPAIRMENTS: Abnormal gait, decreased activity tolerance, decreased balance, decreased coordination, decreased endurance, decreased mobility, difficulty walking, decreased ROM, decreased strength, dizziness, increased muscle spasms, impaired flexibility, impaired vision/preception, improper body mechanics, postural dysfunction, and pain.   REHAB POTENTIAL: Good  CLINICAL DECISION MAKING: Stable/uncomplicated  EVALUATION COMPLEXITY: Low   PLAN:  PT FREQUENCY: 1-2x/week  PT DURATION: 12 weeks  PLANNED INTERVENTIONS: Therapeutic exercises, Therapeutic activity, Neuromuscular re-education, Balance training, Gait training,  Patient/Family education, Self Care, Joint mobilization, Vestibular training, Canalith repositioning, Visual/preceptual remediation/compensation, Dry Needling, Electrical stimulation, Spinal mobilization, Cryotherapy, Moist heat, Taping, Traction, Ultrasound, and Manual therapy  PLAN FOR NEXT SESSION: continue to assess VOR, help with neck spasms, can try some traction as she reports some N/T in bilateral UE    Cassie Freer, PT 09/29/2023, 5:12 PM

## 2023-09-28 NOTE — Telephone Encounter (Signed)
Patient LVM on nurse line yesterday evening regarding questions about results and treatment.   Spoke with Dr. Marsh Dolly this morning who advised that she sent treatment into pharmacy based off of clinical exam and WBC present on wet prep.   Returned call to patient. She was asking if she still needs to take diflucan given that yeast was negative on wet prep. Advised of message per Dr. Marsh Dolly.   All questions answered.   Patient will call back with any additional questions, concerns or if her symptoms does not improve.   Veronda Prude, RN

## 2023-09-29 ENCOUNTER — Ambulatory Visit: Payer: Medicaid Other | Attending: Family Medicine

## 2023-09-29 DIAGNOSIS — M542 Cervicalgia: Secondary | ICD-10-CM | POA: Insufficient documentation

## 2023-09-29 DIAGNOSIS — F4311 Post-traumatic stress disorder, acute: Secondary | ICD-10-CM | POA: Diagnosis not present

## 2023-09-29 DIAGNOSIS — R42 Dizziness and giddiness: Secondary | ICD-10-CM | POA: Insufficient documentation

## 2023-10-03 ENCOUNTER — Ambulatory Visit: Payer: Medicaid Other | Admitting: Physical Therapy

## 2023-10-06 DIAGNOSIS — F4311 Post-traumatic stress disorder, acute: Secondary | ICD-10-CM | POA: Diagnosis not present

## 2023-10-10 ENCOUNTER — Ambulatory Visit: Payer: Medicaid Other

## 2023-10-10 DIAGNOSIS — R42 Dizziness and giddiness: Secondary | ICD-10-CM

## 2023-10-10 DIAGNOSIS — M542 Cervicalgia: Secondary | ICD-10-CM | POA: Diagnosis not present

## 2023-10-10 NOTE — Therapy (Signed)
OUTPATIENT PHYSICAL THERAPY VESTIBULAR TREATMENT     Patient Name: Lauren Obrien MRN: [id number] DOB:05-21-2001, 22 y.o., female Today's Date: 10/10/2023  END OF SESSION:  PT End of Session - 10/10/23 1632     Visit Number 5    Number of Visits 12    Date for PT Re-Evaluation 10/18/23    Authorization Type [employer]    Authorization - Number of Visits 4    PT Start Time 1630    PT Stop Time 1700    PT Time Calculation (min) 30 min    Activity Tolerance Patient tolerated treatment well    Behavior During Therapy [facility] for tasks assessed/performed                Past Medical History:  Diagnosis Date   Acne vulgaris    Cough 11/17/2021   COVID-19 12/11/2020   Dizziness 08/23/2019   Folliculitis 08/29/2019   GERD (gastroesophageal reflux disease)    Helicobacter pylori gastritis    Hidradenitis suppurativa    Neuropathy    BUE   Pharyngitis 11/13/2019   Pre-diabetes    Routine screening for STI (sexually transmitted infection) 12/26/2020   Seasonal allergies    Sleep apnea    Sleep deprivation 09/28/2018   Snoring 01/22/2020   Sore throat 11/13/2019   Strep pharyngitis 05/29/2022   Viral URI 02/01/2020   Past Surgical History:  Procedure Laterality Date   NO PAST SURGERIES     TOOTH EXTRACTION Bilateral 08/25/2021   Procedure: DENTAL RESTORATION/EXTRACTIONS;  Surgeon: Ocie Doyne, DMD;  Location: [facility];  Service: Oral Surgery;  Laterality: Bilateral;   Patient Active Problem List   Diagnosis Date Noted   Chronic midline low back pain without sciatica 06/10/2023   Nail discoloration 03/28/2023   Attempted IUD removal, unsuccessful 11/17/2022   Umbilical discharge 05/04/2022   Paresthesias 03/20/2022   Right wrist tendinitis 06/06/2020   Morbid obesity (HCC) 01/22/2020   Chronic fatigue 01/22/2020   Excessive daytime sleepiness 01/22/2020   OSA (obstructive sleep apnea) 01/22/2020   Oropharyngeal dysphagia 01/11/2020    Prediabetes 08/29/2019   Dizziness 08/23/2019   Memory difficulties 07/01/2019   Chronic tension type headache 05/29/2019   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   Hidradenitis suppurativa 03/04/2019   Carpal tunnel syndrome of left wrist 01/04/2019   Learning difficulty 12/16/2018   Left wrist tendinitis 12/01/2018   Irregular menstrual bleeding 04/03/2018   Acid reflux 01/21/2016   Chronic constipation 07/24/2015    PCP: Marsh Dolly, MD REFERRING PROVIDER: Terisa Starr, MD  REFERRING DIAG: vertigo, dizziness  THERAPY DIAG:  Cervicalgia  Dizziness and giddiness  ONSET DATE: 08/13/23  Rationale for Evaluation and Treatment: Rehabilitation  SUBJECTIVE:   SUBJECTIVE STATEMENT: I got confused I thought my appointment was at 4:30. I know I am here for dizziness but I have some muscle aches in my sides.   Pt accompanied by: self  PERTINENT HISTORY: GERD  PAIN:  Are you having pain? Yes: NPRS scale: 5/10 Pain location: upper traps and neck Pain description: tight, HA Aggravating factors: stress Relieving factors: rest  PRECAUTIONS: None  RED FLAGS: None   WEIGHT BEARING RESTRICTIONS: No  FALLS: Has patient fallen in last 6 months? 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  DIAGNOSTIC FINDINGS: none  COGNITION: Overall cognitive status: Within functional limits for tasks assessed   SENSATION: WFL  PALPATION:  Very tight and tender in the upper traps  POSTURE:  rounded shoulders and forward head  Cervical ROM:  all motions increased dizziness a little  Active A/PROM (deg) eval  Flexion WNL  Extension Decreased 25%  Right lateral  flexion WNL  Left lateral flexion WNL  Right rotation WNL  Left rotation WNL  (Blank rows = not tested)  STRENGTH: UE strength 4-/5  VESTIBULAR ASSESSMENT:  DHI:  58%  SYMPTOM BEHAVIOR:   Non-Vestibular symptoms: neck pain and headaches  Type of dizziness: Lightheadedness/Faint  Frequency: everyday  Duration: minutes to hours  Aggravating factors: Worse in the morning and bending forward, stress, getting up in the morning  Relieving factors: closing eyes and slow movements  Progression of symptoms: unchanged  OCULOMOTOR EXAM:  Ocular Alignment: normal  Ocular ROM: No Limitations  Spontaneous Nystagmus: absent  Gaze-Induced Nystagmus: absent  Smooth Pursuits: saccades mild  Saccades: dysmetria  Convergence/Divergence:  cm   VESTIBULAR - OCULAR REFLEX:   Slow VOR: Positive Bilaterally  VOR Cancellation: Unable to Maintain Gaze    POSITIONAL TESTING: Right Dix-Hallpike: no nystagmus Left Dix-Hallpike: no nystagmus  MOTION SENSITIVITY:  Reports that she would rate her dizziness a 2-3/10 most times but does increase with bending forward, quick movements and turns   OTHOSTATICS: not done  VESTIBULAR TREATMENT:                                                                                                   DATE:  10/10/23 NuStep L5x79mins  VOR x1 and x2 standing 20 reps each Walking on beam  BPPV canal testing- negative with R [employer] but positive with horizontal canal   09/29/23 NuStep L4 x58mins  Calf stretch on slant 15s x3  Calf raises 2x10 VOR x1 and x2 seated Standing on airex EO, EC, head turns   09/13/23 VOR x1  VOR x2 Functional gait assessment  Education about vestibular system and how VOR exercises will be beneficial  Standing on airex EO, EC, head turns    08/29/23 Dix halpike negative bilaterally, she felt supported and did not get dizzy BP sitting 134/110 BP standing 130/100 BP Supine  130/100 Reviewed VOR, performed 2x Education on  the VOR, vs BPVVV, vs orthostatics, she has a lot of questions that we went over   PATIENT EDUCATION: Education details: HEP and POC Person educated: Patient Education method: Programmer, multimedia, Facilities manager, Verbal cues, and Handouts Education comprehension: verbalized understanding  HOME EXERCISE PROGRAM: VOR exercises   GOALS: Goals reviewed with patient? Yes  SHORT TERM GOALS: Target date: 08/27/23  Independent with initial HEP Goal status: IN PROGRESS   LONG TERM GOALS: Target date: 11/14/23  Independent with advanced HEP Goal status: INITIAL  2.  Assess BPPV again and orthostatics Goal status: MET 08/29/23  3.  Report that she has 50% less dizziness with getting up in the AM Goal status: IN PROGRESS  4.  No  dizziness with looking up or down Goal status: IN PROGRESS   ASSESSMENT:  CLINICAL IMPRESSION: Patient is a 22 y.o. female who was seen today for physical therapy treatment for dizziness. She is late today, because she thought her appt was 4:30. Still having dizziness mostly in the mornings. Continued to work on some VOR exercises, which increases her dizziness but it does not last more than a few seconds. VOR x2 increases her symptoms quicker. Able to do longer reps with VOR today. Reports dizziness when looking down on beam and when turning. Did some canal testing at the end, clear for R [employer] but she got dizzy with left head turn so testing horizontal canal which provoked her dizziness.   OBJECTIVE IMPAIRMENTS: Abnormal gait, decreased activity tolerance, decreased balance, decreased coordination, decreased endurance, decreased mobility, difficulty walking, decreased ROM, decreased strength, dizziness, increased muscle spasms, impaired flexibility, impaired vision/preception, improper body mechanics, postural dysfunction, and pain.   REHAB POTENTIAL: Good  CLINICAL DECISION MAKING: Stable/uncomplicated  EVALUATION COMPLEXITY: Low   PLAN:  PT  FREQUENCY: 1-2x/week  PT DURATION: 12 weeks  PLANNED INTERVENTIONS: Therapeutic exercises, Therapeutic activity, Neuromuscular re-education, Balance training, Gait training, Patient/Family education, Self Care, Joint mobilization, Vestibular training, Canalith repositioning, Visual/preceptual remediation/compensation, Dry Needling, Electrical stimulation, Spinal mobilization, Cryotherapy, Moist heat, Taping, Traction, Ultrasound, and Manual therapy  PLAN FOR NEXT SESSION: continue to assess VOR and work on dizziness    Cassie Freer, PT 10/10/2023, 5:01 PM

## 2023-10-11 ENCOUNTER — Encounter: Payer: Self-pay | Admitting: Family Medicine

## 2023-10-11 ENCOUNTER — Ambulatory Visit: Payer: Medicaid Other | Admitting: Family Medicine

## 2023-10-11 VITALS — BP 125/75 | HR 91 | Ht 64.0 in | Wt 310.2 lb

## 2023-10-11 DIAGNOSIS — R631 Polydipsia: Secondary | ICD-10-CM

## 2023-10-11 LAB — POCT URINALYSIS DIP (MANUAL ENTRY)
Bilirubin, UA: NEGATIVE
Blood, UA: NEGATIVE
Glucose, UA: NEGATIVE mg/dL
Ketones, POC UA: NEGATIVE mg/dL
Leukocytes, UA: NEGATIVE
Nitrite, UA: NEGATIVE
Protein Ur, POC: NEGATIVE mg/dL
Spec Grav, UA: >=1.03 — AB (ref 1.010–1.025)
Urobilinogen, UA: 0.2 U/dL
pH, UA: 5.5 (ref 5.0–8.0)

## 2023-10-11 LAB — POCT GLYCOSYLATED HEMOGLOBIN (HGB A1C): HbA1c, POC (prediabetic range): 5.7 % (ref 5.7–6.4)

## 2023-10-11 NOTE — Patient Instructions (Signed)
We obtained labs today. Try to use pedialyte to help increase electrolytes.

## 2023-10-11 NOTE — Assessment & Plan Note (Addendum)
Unclear specific cause.  Reassured by well-hydrated status on exam.  Consider diabetes mellitus, diabetes insipidus, anticholinergic medications (though does not endorse any suggestive medications), autoimmune conditions like Sjogren syndrome (no other areas of dryness), psychogenic polydipsia.  Will further evaluate with BMP, A1c, UA.  Follow-up results.

## 2023-10-11 NOTE — Progress Notes (Signed)
    SUBJECTIVE:   CHIEF COMPLAINT / HPI:   Dry mouth and thirst First noticed last week but maybe some time before that. Felt really really thirsty and when she drank more water, she felt even more thirsty. Never happened before. Has also been urinating a lot. No oral pain. Unsure if feeling more hungry lately too but did have some abdominal pain with eating two days ago. Sometimes takes iburpofen for migraines and sometimes MVI. No history or family history of autoimmune disease. No other areas that are dry. Has not been using CPAP and feels this could be worsening her symptoms.  PERTINENT  PMH / PSH: OSA, GERD, neuropathy, at bedtime, prediabetes, dizziness, chronic headache, chronic low back pain, chronic fatigue  OBJECTIVE:   BP 125/75   Pulse 91   Ht 5\' 4"  (1.626 m)   Wt (!) 310 lb 3.2 oz (140.7 kg)   LMP 10/04/2023   SpO2 98%   BMI 53.25 kg/m   General: Alert and oriented, in NAD Skin: Warm, dry, and intact without lesions HEENT: NCAT, EOM grossly normal, midline nasal septum, mildly dry lips, otherwise moist oropharynx Cardiac: RRR, no m/r/g appreciated Respiratory: CTAB, breathing and speaking comfortably on RA Abdominal: Soft, nontender, nondistended, normoactive bowel sounds Extremities: Moves all extremities grossly equally Neurological: No gross focal deficit Psychiatric: Appropriate mood and affect   ASSESSMENT/PLAN:   Polydipsia Unclear specific cause.  Reassured by well-hydrated status on exam.  Consider diabetes mellitus, diabetes insipidus, anticholinergic medications (though does not endorse any suggestive medications), autoimmune conditions like Sjogren syndrome (no other areas of dryness), psychogenic polydipsia.  Will further evaluate with BMP, A1c, UA.  Follow-up results.  Lauren Holmes, MD [facility]

## 2023-10-12 LAB — BASIC METABOLIC PANEL
BUN/Creatinine Ratio: 17 (ref 9–23)
BUN: 9 mg/dL (ref 6–20)
CO2: 24 mmol/L (ref 20–29)
Calcium: 9 mg/dL (ref 8.7–10.2)
Chloride: 104 mmol/L (ref 96–106)
Creatinine, Ser: 0.53 mg/dL — ABNORMAL LOW (ref 0.57–1.00)
Glucose: 79 mg/dL (ref 70–99)
Potassium: 4.3 mmol/L (ref 3.5–5.2)
Sodium: 142 mmol/L (ref 134–144)
eGFR: 133 mL/min/{1.73_m2} (ref >59)

## 2023-10-20 DIAGNOSIS — F4311 Post-traumatic stress disorder, acute: Secondary | ICD-10-CM | POA: Diagnosis not present

## 2023-10-24 ENCOUNTER — Encounter: Payer: Self-pay | Admitting: Physical Therapy

## 2023-10-24 ENCOUNTER — Ambulatory Visit: Payer: Medicaid Other | Attending: Family Medicine | Admitting: Physical Therapy

## 2023-10-24 DIAGNOSIS — M542 Cervicalgia: Secondary | ICD-10-CM | POA: Insufficient documentation

## 2023-10-24 DIAGNOSIS — R42 Dizziness and giddiness: Secondary | ICD-10-CM | POA: Diagnosis not present

## 2023-10-24 NOTE — Therapy (Signed)
OUTPATIENT PHYSICAL THERAPY VESTIBULAR TREATMENT     Patient Name: Lauren Obrien MRN: [id number] DOB:2001/02/11, 22 y.o., female Today's Date: 10/24/2023  END OF SESSION:  PT End of Session - 10/24/23 1819     Visit Number 6    Date for PT Re-Evaluation 10/18/23    PT Start Time 1717    PT Stop Time 1757    PT Time Calculation (min) 40 min    Activity Tolerance Patient tolerated treatment well    Behavior During Therapy [facility] for tasks assessed/performed            Past Medical History:  Diagnosis Date   Acne vulgaris    Cough 11/17/2021   COVID-19 12/11/2020   Dizziness 08/23/2019   Folliculitis 08/29/2019   GERD (gastroesophageal reflux disease)    Helicobacter pylori gastritis    Hidradenitis suppurativa    Neuropathy    BUE   Pharyngitis 11/13/2019   Pre-diabetes    Routine screening for STI (sexually transmitted infection) 12/26/2020   Seasonal allergies    Sleep apnea    Sleep deprivation 09/28/2018   Snoring 01/22/2020   Sore throat 11/13/2019   Strep pharyngitis 05/29/2022   Viral URI 02/01/2020   Past Surgical History:  Procedure Laterality Date   NO PAST SURGERIES     TOOTH EXTRACTION Bilateral 08/25/2021   Procedure: DENTAL RESTORATION/EXTRACTIONS;  Surgeon: Ocie Doyne, DMD;  Location: [facility];  Service: Oral Surgery;  Laterality: Bilateral;   Patient Active Problem List   Diagnosis Date Noted   Polydipsia 10/11/2023   Chronic midline low back pain without sciatica 06/10/2023   Nail discoloration 03/28/2023   Attempted IUD removal, unsuccessful 11/17/2022   Umbilical discharge 05/04/2022   Paresthesias 03/20/2022   Right wrist tendinitis 06/06/2020   Morbid obesity (HCC) 01/22/2020   Chronic fatigue 01/22/2020   Excessive daytime sleepiness 01/22/2020   OSA (obstructive sleep apnea) 01/22/2020   Oropharyngeal dysphagia 01/11/2020   Prediabetes 08/29/2019   Dizziness 08/23/2019   Memory difficulties 07/01/2019    Chronic tension type headache 05/29/2019   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   Hidradenitis suppurativa 03/04/2019   Carpal tunnel syndrome of left wrist 01/04/2019   Learning difficulty 12/16/2018   Left wrist tendinitis 12/01/2018   Irregular menstrual bleeding 04/03/2018   Acid reflux 01/21/2016   Chronic constipation 07/24/2015    PCP: Marsh Dolly, MD REFERRING PROVIDER: Terisa Starr, MD  REFERRING DIAG: vertigo, dizziness  THERAPY DIAG:  Cervicalgia  Dizziness and giddiness  ONSET DATE: 08/13/23  Rationale for Evaluation and Treatment: Rehabilitation  SUBJECTIVE:   SUBJECTIVE STATEMENT: Patient reports that her dizziness continues. She has dizziness when closing her eyes or getting up at night.  Pt accompanied by: self  PERTINENT HISTORY: GERD  PAIN:  Are you having pain? Yes: NPRS scale: 5/10 Pain location: upper traps and neck Pain description: tight, HA Aggravating factors: stress Relieving factors: rest  PRECAUTIONS: None  RED FLAGS: None   WEIGHT BEARING RESTRICTIONS: No  FALLS: Has patient fallen in last 6 months? 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  DIAGNOSTIC FINDINGS: none  COGNITION: Overall cognitive status: Within functional limits for tasks assessed   SENSATION: WFL  PALPATION:  Very  tight and tender in the upper traps  POSTURE:  rounded shoulders and forward head  Cervical ROM:  all motions increased dizziness a little  Active A/PROM (deg) eval  Flexion WNL  Extension Decreased 25%  Right lateral flexion WNL  Left lateral flexion WNL  Right rotation WNL  Left rotation WNL  (Blank rows = not  tested)  STRENGTH: UE strength 4-/5  VESTIBULAR ASSESSMENT:  DHI:  58%  SYMPTOM BEHAVIOR:   Non-Vestibular symptoms: neck pain and headaches  Type of dizziness: Lightheadedness/Faint  Frequency: everyday  Duration: minutes to hours  Aggravating factors: Worse in the morning and bending forward, stress, getting up in the morning  Relieving factors: closing eyes and slow movements  Progression of symptoms: unchanged  OCULOMOTOR EXAM:  Ocular Alignment: normal  Ocular ROM: No Limitations  Spontaneous Nystagmus: absent  Gaze-Induced Nystagmus: absent  Smooth Pursuits: saccades mild  Saccades: dysmetria  Convergence/Divergence:  cm   VESTIBULAR - OCULAR REFLEX:   Slow VOR: Positive Bilaterally  VOR Cancellation: Unable to Maintain Gaze    POSITIONAL TESTING: Right Dix-Hallpike: no nystagmus Left Dix-Hallpike: no nystagmus  MOTION SENSITIVITY:  Reports that she would rate her dizziness a 2-3/10 most times but does increase with bending forward, quick movements and turns   OTHOSTATICS: not done  VESTIBULAR TREATMENT:                                                                                                   DATE:  10/24/23 Dizziness training including standing on Air ex pad, standing with narrow BOS on pad, standing on solid ground with eyes closed and then with narrow BOS and eyes closed. Able to get to 30 sec for each without significant increase in her dizziness. Ambulation x 20' with head turns, x 2. Dizziness rose to 4, but dropped back to 2. Repeat with head nods, dizziness increased to 5, but dropped again.  10/10/23 NuStep L5x97mins  VOR x1 and x2 standing 20 reps each Walking on beam  BPPV canal testing- negative with R Gilberto Better but positive with horizontal canal   09/29/23 NuStep L4 x4mins  Calf stretch on slant 15s x3  Calf raises 2x10 VOR x1 and x2 seated Standing on airex EO, EC, head turns   09/13/23 VOR x1  VOR x2 Functional gait assessment   Education about vestibular system and how VOR exercises will be beneficial  Standing on airex EO, EC, head turns    08/29/23 Dix halpike negative bilaterally, she felt supported and did not get dizzy BP sitting 134/110 BP standing 130/100 BP Supine  130/100 Reviewed VOR, performed 2x Education on the VOR, vs BPVVV, vs orthostatics, she has a lot of questions that we went over   PATIENT EDUCATION: Education details: HEP and POC Person educated: Patient Education method: Programmer, multimedia, Demonstration, Verbal cues, and Handouts Education comprehension: verbalized understanding  HOME EXERCISE PROGRAM: VOR exercises   GOALS: Goals reviewed with patient? Yes  SHORT TERM GOALS: Target date: 08/27/23  Independent with initial HEP Goal status: 10/24/23- Patient reports she inconsistently performs due to the discomfort, ongoing  LONG  TERM GOALS: Target date: 11/14/23  Independent with advanced HEP Goal status: INITIAL  2.  Assess BPPV again and orthostatics Goal status: MET 08/29/23  3.  Report that she has 50% less dizziness with getting up in the AM Goal status: 10/24/23-Patient reports that the dizziness may be improving in that it does not last as long, but still having it. Ongoing  4.  No dizziness with looking up or down Goal status: 10/24/23-up to 5/10 after walking 20' with head nods. Ongoing   ASSESSMENT:  CLINICAL IMPRESSION: Patient is a 22 y.o. female who was seen today for physical therapy treatment for dizziness. She is late today, because she thought her appt was 4:30. Still having dizziness mostly in the mornings. Continued to work on some VOR exercises, which increases her dizziness but it does not last more than a few seconds. She has been going too long at home, causing her dizziness to rise to uncomfortable levels, so she has been reluctant to continue. Encouraged to perform daily, but only to level 4.  OBJECTIVE IMPAIRMENTS: Abnormal gait, decreased  activity tolerance, decreased balance, decreased coordination, decreased endurance, decreased mobility, difficulty walking, decreased ROM, decreased strength, dizziness, increased muscle spasms, impaired flexibility, impaired vision/preception, improper body mechanics, postural dysfunction, and pain.   REHAB POTENTIAL: Good  CLINICAL DECISION MAKING: Stable/uncomplicated  EVALUATION COMPLEXITY: Low   PLAN:  PT FREQUENCY: 1-2x/week  PT DURATION: 12 weeks  PLANNED INTERVENTIONS: Therapeutic exercises, Therapeutic activity, Neuromuscular re-education, Balance training, Gait training, Patient/Family education, Self Care, Joint mobilization, Vestibular training, Canalith repositioning, Visual/preceptual remediation/compensation, Dry Needling, Electrical stimulation, Spinal mobilization, Cryotherapy, Moist heat, Taping, Traction, Ultrasound, and Manual therapy  PLAN FOR NEXT SESSION: continue to assess VOR and work on dizziness    Iona Beard, PT 10/24/2023, 6:51 PM

## 2023-10-27 DIAGNOSIS — F4311 Post-traumatic stress disorder, acute: Secondary | ICD-10-CM | POA: Diagnosis not present

## 2023-10-28 LAB — AMB RESULTS CONSOLE CBG: Glucose: 89

## 2023-10-31 ENCOUNTER — Ambulatory Visit: Payer: Medicaid Other | Admitting: Physical Therapy

## 2023-10-31 ENCOUNTER — Encounter: Payer: Self-pay | Admitting: Physical Therapy

## 2023-10-31 DIAGNOSIS — R42 Dizziness and giddiness: Secondary | ICD-10-CM | POA: Diagnosis not present

## 2023-10-31 DIAGNOSIS — M542 Cervicalgia: Secondary | ICD-10-CM

## 2023-10-31 NOTE — Therapy (Signed)
OUTPATIENT PHYSICAL THERAPY VESTIBULAR TREATMENT   Patient Name: Lauren Obrien MRN: [id number] DOB:May 06, 2001, 22 y.o., female Today's Date: 10/31/2023  END OF SESSION:  PT End of Session - 10/31/23 1637     Visit Number 7    Date for PT Re-Evaluation 10/18/23    PT Start Time 1633    PT Stop Time 1712    PT Time Calculation (min) 39 min    Activity Tolerance Patient tolerated treatment well    Behavior During Therapy [facility] for tasks assessed/performed            Past Medical History:  Diagnosis Date   Acne vulgaris    Cough 11/17/2021   COVID-19 12/11/2020   Dizziness 08/23/2019   Folliculitis 08/29/2019   GERD (gastroesophageal reflux disease)    Helicobacter pylori gastritis    Hidradenitis suppurativa    Neuropathy    BUE   Pharyngitis 11/13/2019   Pre-diabetes    Routine screening for STI (sexually transmitted infection) 12/26/2020   Seasonal allergies    Sleep apnea    Sleep deprivation 09/28/2018   Snoring 01/22/2020   Sore throat 11/13/2019   Strep pharyngitis 05/29/2022   Viral URI 02/01/2020   Past Surgical History:  Procedure Laterality Date   NO PAST SURGERIES     TOOTH EXTRACTION Bilateral 08/25/2021   Procedure: DENTAL RESTORATION/EXTRACTIONS;  Surgeon: Ocie Doyne, DMD;  Location: [facility];  Service: Oral Surgery;  Laterality: Bilateral;   Patient Active Problem List   Diagnosis Date Noted   Polydipsia 10/11/2023   Chronic midline low back pain without sciatica 06/10/2023   Nail discoloration 03/28/2023   Attempted IUD removal, unsuccessful 11/17/2022   Umbilical discharge 05/04/2022   Paresthesias 03/20/2022   Right wrist tendinitis 06/06/2020   Morbid obesity (HCC) 01/22/2020   Chronic fatigue 01/22/2020   Excessive daytime sleepiness 01/22/2020   OSA (obstructive sleep apnea) 01/22/2020   Oropharyngeal dysphagia 01/11/2020   Prediabetes 08/29/2019   Dizziness 08/23/2019   Memory difficulties 07/01/2019    Chronic tension type headache 05/29/2019   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   Hidradenitis suppurativa 03/04/2019   Carpal tunnel syndrome of left wrist 01/04/2019   Learning difficulty 12/16/2018   Left wrist tendinitis 12/01/2018   Irregular menstrual bleeding 04/03/2018   Acid reflux 01/21/2016   Chronic constipation 07/24/2015    PCP: Marsh Dolly, MD REFERRING PROVIDER: Terisa Starr, MD  REFERRING DIAG: vertigo, dizziness  THERAPY DIAG:  Cervicalgia  Dizziness and giddiness  ONSET DATE: 08/13/23  Rationale for Evaluation and Treatment: Rehabilitation  SUBJECTIVE:   SUBJECTIVE STATEMENT: Patient reports that her dizziness seemed to increase this week. She feels stress has added to her dizziness.  Pt accompanied by: self  PERTINENT HISTORY: GERD  PAIN:  Are you having pain? Yes: NPRS scale: 5/10 Pain location: upper traps and neck Pain description: tight, HA Aggravating factors: stress Relieving factors: rest  PRECAUTIONS: None  RED FLAGS: None   WEIGHT BEARING RESTRICTIONS: No  FALLS: Has patient fallen in last 6 months? 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  DIAGNOSTIC FINDINGS: none  COGNITION: Overall cognitive status: Within functional limits for tasks assessed   SENSATION: WFL  PALPATION:  Very tight and  tender in the upper traps  POSTURE:  rounded shoulders and forward head  Cervical ROM:  all motions increased dizziness a little  Active A/PROM (deg) eval  Flexion WNL  Extension Decreased 25%  Right lateral flexion WNL  Left lateral flexion WNL  Right rotation WNL  Left rotation WNL  (Blank rows = not  tested)  STRENGTH: UE strength 4-/5  VESTIBULAR ASSESSMENT:  DHI:  58%  SYMPTOM BEHAVIOR:   Non-Vestibular symptoms: neck pain and headaches  Type of dizziness: Lightheadedness/Faint  Frequency: everyday  Duration: minutes to hours  Aggravating factors: Worse in the morning and bending forward, stress, getting up in the morning  Relieving factors: closing eyes and slow movements  Progression of symptoms: unchanged  OCULOMOTOR EXAM:  Ocular Alignment: normal  Ocular ROM: No Limitations  Spontaneous Nystagmus: absent  Gaze-Induced Nystagmus: absent  Smooth Pursuits: saccades mild  Saccades: dysmetria  Convergence/Divergence:  cm   VESTIBULAR - OCULAR REFLEX:   Slow VOR: Positive Bilaterally  VOR Cancellation: Unable to Maintain Gaze    POSITIONAL TESTING: Right Dix-Hallpike: no nystagmus Left Dix-Hallpike: no nystagmus  MOTION SENSITIVITY:  Reports that she would rate her dizziness a 2-3/10 most times but does increase with bending forward, quick movements and turns   OTHOSTATICS: not done  VESTIBULAR TREATMENT:                                                                                                   DATE:  10/31/23 NuStep L5 x 6 minutes. Orthostatic BP testing Supine 124/82 77 BPM Reports some dizziness after moving sup to sit Sitting immediate 119/79 81 BPM, 3 minutes 97/77 85 BPM Standing immediate 102/80 86 BPM Education regarding BP and safety  Education regarding staying hydrated and how to determine if she is well hydrated. VOR-Seated, horizontal, 3 x 10 head turns, unable to complete 10 due to dizziness rising to 4/10 from 2/10 VOR x2- Seated horizontal, 2 x 10 head turns, unable to complete.  10/24/23 Dizziness training including standing on Air ex pad, standing with narrow BOS on pad, standing on solid ground with eyes closed and then with narrow BOS and eyes closed. Able to get to 30 sec for each without significant increase in her  dizziness. Ambulation x 20' with head turns, x 2. Dizziness rose to 4, but dropped back to 2. Repeat with head nods, dizziness increased to 5, but dropped again.  10/10/23 NuStep L5x1mins  VOR x1 and x2 standing 20 reps each Walking on beam  BPPV canal testing- negative with R Gilberto Better but positive with horizontal canal   09/29/23 NuStep L4 x24mins  Calf stretch on slant 15s x3  Calf raises 2x10 VOR x1 and x2 seated Standing on airex EO, EC, head turns   09/13/23 VOR x1  VOR x2 Functional gait assessment  Education about vestibular system and how VOR exercises will be beneficial  Standing on airex EO, EC, head turns    08/29/23 Dix halpike negative bilaterally, she felt supported and did not get dizzy BP sitting 134/110 BP standing 130/100 BP Supine  130/100 Reviewed VOR,  performed 2x Education on the VOR, vs BPVVV, vs orthostatics, she has a lot of questions that we went over   PATIENT EDUCATION: Education details: HEP and POC Person educated: Patient Education method: Programmer, multimedia, Demonstration, Verbal cues, and Handouts Education comprehension: verbalized understanding  HOME EXERCISE PROGRAM: VOR exercises   GOALS: Goals reviewed with patient? Yes  SHORT TERM GOALS: Target date: 08/27/23  Independent with initial HEP Goal status: 10/24/23- Patient reports she inconsistently performs due to the discomfort, ongoing  LONG TERM GOALS: Target date: 11/14/23  Independent with advanced HEP Goal status: INITIAL  2.  Assess BPPV again and orthostatics Goal status: MET 08/29/23  3.  Report that she has 50% less dizziness with getting up in the AM Goal status: 10/24/23-Patient reports that the dizziness may be improving in that it does not last as long, but still having it. Ongoing  4.  No dizziness with looking up or down Goal status: 10/24/23-up to 5/10 after walking 20' with head nods. Ongoing   ASSESSMENT:  CLINICAL IMPRESSION: Patient is a 22 y.o.  female who was seen today for physical therapy treatment for dizziness. She is late today, because she thought her appt was 4:30. Still having dizziness mostly in the mornings. Continued to work on some VOR exercises, which increases her dizziness but it does not last more than a few seconds. She has been going too long at home, causing her dizziness to rise to uncomfortable levels, so she has been reluctant to continue. Assessed orthostatic BPs, her BP does drop, which may be contributing to her dizziness. Educated her to safety with standing and ambulating.  OBJECTIVE IMPAIRMENTS: Abnormal gait, decreased activity tolerance, decreased balance, decreased coordination, decreased endurance, decreased mobility, difficulty walking, decreased ROM, decreased strength, dizziness, increased muscle spasms, impaired flexibility, impaired vision/preception, improper body mechanics, postural dysfunction, and pain.   REHAB POTENTIAL: Good  CLINICAL DECISION MAKING: Stable/uncomplicated  EVALUATION COMPLEXITY: Low   PLAN:  PT FREQUENCY: 1-2x/week  PT DURATION: 12 weeks  PLANNED INTERVENTIONS: Therapeutic exercises, Therapeutic activity, Neuromuscular re-education, Balance training, Gait training, Patient/Family education, Self Care, Joint mobilization, Vestibular training, Canalith repositioning, Visual/preceptual remediation/compensation, Dry Needling, Electrical stimulation, Spinal mobilization, Cryotherapy, Moist heat, Taping, Traction, Ultrasound, and Manual therapy  PLAN FOR NEXT SESSION: continue to assess VOR and work on dizziness    Iona Beard, DPT 10/31/2023, 5:09 PM

## 2023-11-03 DIAGNOSIS — F4311 Post-traumatic stress disorder, acute: Secondary | ICD-10-CM | POA: Diagnosis not present

## 2023-11-10 ENCOUNTER — Ambulatory Visit: Payer: Medicaid Other

## 2023-11-14 ENCOUNTER — Ambulatory Visit: Payer: Medicaid Other

## 2023-11-17 ENCOUNTER — Ambulatory Visit: Payer: Medicaid Other | Admitting: Student

## 2023-11-17 VITALS — BP 118/68 | HR 89 | Wt 311.0 lb

## 2023-11-17 DIAGNOSIS — M7661 Achilles tendinitis, right leg: Secondary | ICD-10-CM | POA: Diagnosis not present

## 2023-11-17 DIAGNOSIS — J069 Acute upper respiratory infection, unspecified: Secondary | ICD-10-CM

## 2023-11-17 MED ORDER — NAPROXEN 500 MG PO TABS: 500.0000 mg | ORAL_TABLET | Freq: Two times a day (BID) | ORAL | 0 refills | Status: AC

## 2023-11-17 MED ORDER — FLUTICASONE PROPIONATE 50 MCG/ACT NA SUSP: NASAL | 0 refills | Status: DC

## 2023-11-17 NOTE — Progress Notes (Signed)
    SUBJECTIVE:   CHIEF COMPLAINT / HPI:   Sick symptoms 4 days ago started Family member sick as well A little nausea one day now none, no vomiting No diarrhea Congestion and cough Yesterday and day before feeling bad and sick Headache and pain with sore throat No fevers  Right Foot Pain Right foot pain as well Has been going on for 2 weeks When moving it and hurts at night Used arthritis cream Limping--worse with achilles No known injury More at night and when moving feet Tylenol  500 mg x 1 yesterday  PERTINENT  PMH / PSH: prediabetes, obesity  OBJECTIVE:   BP 118/68   Pulse 89   Wt (!) 311 lb (141.1 kg)   SpO2 93%   BMI 53.38 kg/m   General: Well appearing, NAD, awake, alert, responsive to questions Head: Normocephalic atraumatic, nasal turbinate swollen bilaterally, tonsil stones present on oropharynx however no exudates, no cervical adenopathy CV: Regular rate and rhythm no murmurs rubs or gallops Respiratory: Clear to ausculation bilaterally, no wheezes rales or crackles, chest rises symmetrically,  no increased work of breathing Abdomen: Soft, non-tender Extremities: Moves upper and lower extremities freely, pain over Achilles insertion point on the right foot, pulses intact distally, pain with dorsiflexion, inversion, eversion of right foot, nontender over navicular, malleoli or base of metatarsals  ASSESSMENT/PLAN:   Assessment & Plan Achilles tendinitis of right lower extremity Right foot pain for 2 weeks, worsened over the past day. Pain localized to the back of the foot, exacerbated by dorsiflexion and eversion. No known injury. -Start Naproxen  twice daily for 5 days -RICE -If not improving, consider referral to sports medicine.  Viral URI Symptoms of congestion, cough, and headache for 3 days. Benign exam, consistent with Viral URI -Start Flonase  daily for congestion. -Continue Tylenol  500mg  every 6 hours as needed -Symptomatic measures  discussed -ED/Return precautions discussed    Lauren Lesch, MD [facility]

## 2023-11-17 NOTE — Patient Instructions (Signed)
 It was great to see you! Thank you for allowing me to participate in your care!   Our plans for today:  - This is most likely a viral infection -Your lungs sound good on examination -I am sending in Flonase  to take daily to help with congestion -Please continue Tylenol  500 mg every 6 hours as needed and I am also sending in naproxen  twice a day for 5 days for Achilles tendinitis which is what I think is going on with your right foot.  If not improving please come back and we can consider referral to sports medicine  Take care and seek immediate care sooner if you develop any concerns.  Wendel Lesch, MD

## 2023-11-18 DIAGNOSIS — F4311 Post-traumatic stress disorder, acute: Secondary | ICD-10-CM | POA: Diagnosis not present

## 2023-11-24 ENCOUNTER — Ambulatory Visit: Payer: Medicaid Other | Admitting: Physical Therapy

## 2023-11-24 DIAGNOSIS — F4311 Post-traumatic stress disorder, acute: Secondary | ICD-10-CM | POA: Diagnosis not present

## 2023-11-28 ENCOUNTER — Encounter: Payer: Self-pay | Admitting: Student

## 2023-11-28 ENCOUNTER — Encounter: Payer: Self-pay | Admitting: Physical Therapy

## 2023-11-28 ENCOUNTER — Ambulatory Visit: Payer: Medicaid Other | Attending: Family Medicine | Admitting: Physical Therapy

## 2023-11-28 ENCOUNTER — Telehealth: Payer: Self-pay | Admitting: Student

## 2023-11-28 ENCOUNTER — Ambulatory Visit (INDEPENDENT_AMBULATORY_CARE_PROVIDER_SITE_OTHER): Payer: Medicaid Other | Admitting: Student

## 2023-11-28 VITALS — BP 97/75 | HR 86 | Temp 98.1°F | Ht 65.0 in | Wt 309.8 lb

## 2023-11-28 DIAGNOSIS — M542 Cervicalgia: Secondary | ICD-10-CM | POA: Insufficient documentation

## 2023-11-28 DIAGNOSIS — R42 Dizziness and giddiness: Secondary | ICD-10-CM | POA: Insufficient documentation

## 2023-11-28 DIAGNOSIS — R198 Other specified symptoms and signs involving the digestive system and abdomen: Secondary | ICD-10-CM | POA: Insufficient documentation

## 2023-11-28 LAB — POC SOFIA 2 FLU + SARS ANTIGEN FIA
Influenza A, POC: NEGATIVE
Influenza B, POC: NEGATIVE
SARS Coronavirus 2 Ag: NEGATIVE

## 2023-11-28 NOTE — Therapy (Signed)
 OUTPATIENT PHYSICAL THERAPY VESTIBULAR TREATMENT   Patient Name: Lauren Obrien MRN: [id number] DOB:November 07, 2001, 23 y.o., female Today's Date: 11/28/2023  END OF SESSION:  PT End of Session - 11/28/23 1701     Visit Number 8    Date for PT Re-Evaluation 12/19/23    PT Start Time 1632    PT Stop Time 1658    PT Time Calculation (min) 26 min    Activity Tolerance Other (comment)   Limited by nausea and dizziness.            Past Medical History:  Diagnosis Date   Acne vulgaris    Cough 11/17/2021   COVID-19 12/11/2020   Dizziness 08/23/2019   Folliculitis 08/29/2019   GERD (gastroesophageal reflux disease)    Helicobacter pylori gastritis    Hidradenitis suppurativa    Neuropathy    BUE   Pharyngitis 11/13/2019   Pre-diabetes    Routine screening for STI (sexually transmitted infection) 12/26/2020   Seasonal allergies    Sleep apnea    Sleep deprivation 09/28/2018   Snoring 01/22/2020   Sore throat 11/13/2019   Strep pharyngitis 05/29/2022   Viral URI 02/01/2020   Past Surgical History:  Procedure Laterality Date   NO PAST SURGERIES     TOOTH EXTRACTION Bilateral 08/25/2021   Procedure: DENTAL RESTORATION/EXTRACTIONS;  Surgeon: Sheryle Hamilton, DMD;  Location: [facility];  Service: Oral Surgery;  Laterality: Bilateral;   Patient Active Problem List   Diagnosis Date Noted   Gastrointestinal complaints 11/28/2023   Polydipsia 10/11/2023   Chronic midline low back pain without sciatica 06/10/2023   Nail discoloration 03/28/2023   Attempted IUD removal, unsuccessful 11/17/2022   Umbilical discharge 05/04/2022   Paresthesias 03/20/2022   Right wrist tendinitis 06/06/2020   Morbid obesity (HCC) 01/22/2020   Chronic fatigue 01/22/2020   Excessive daytime sleepiness 01/22/2020   OSA (obstructive sleep apnea) 01/22/2020   Oropharyngeal dysphagia 01/11/2020   Prediabetes 08/29/2019   Dizziness 08/23/2019   Memory difficulties 07/01/2019    Chronic tension type headache 05/29/2019   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   Hidradenitis suppurativa 03/04/2019   Carpal tunnel syndrome of left wrist 01/04/2019   Learning difficulty 12/16/2018   Left wrist tendinitis 12/01/2018   Irregular menstrual bleeding 04/03/2018   Acid reflux 01/21/2016   Chronic constipation 07/24/2015    PCP: Nicholas, MD REFERRING PROVIDER: Suzann Daring, MD  REFERRING DIAG: vertigo, dizziness  THERAPY DIAG:  Cervicalgia  Dizziness and giddiness  ONSET DATE: 08/13/23  Rationale for Evaluation and Treatment: Rehabilitation  SUBJECTIVE:   SUBJECTIVE STATEMENT: Patient reports that she missed a couple of treatments due to illness. She had an episode of dizziness on Sat morning, but was able to go back to sleep and when she woke up she had less dizziness, but she then had body aches.  Pt accompanied by: self  PERTINENT HISTORY: GERD  PAIN:  Are you having pain? Yes: NPRS scale: 5/10 Pain location: upper traps and neck Pain description: tight, HA Aggravating factors: stress Relieving factors: rest  PRECAUTIONS: None  RED FLAGS: None   WEIGHT BEARING RESTRICTIONS: No  FALLS: Has patient fallen in last 6 months? 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  DIAGNOSTIC FINDINGS: none  COGNITION: Overall cognitive status: Within functional limits for tasks assessed   SENSATION: WFL  PALPATION:  Very tight and tender in the upper traps  POSTURE:  rounded shoulders and forward head  Cervical ROM:  all motions increased dizziness a little  Active A/PROM (deg) eval  Flexion WNL  Extension Decreased 25%   Right lateral flexion WNL  Left lateral flexion WNL  Right rotation WNL  Left rotation WNL  (Blank rows = not tested)  STRENGTH: UE strength 4-/5  VESTIBULAR ASSESSMENT:  DHI:  58%  SYMPTOM BEHAVIOR:   Non-Vestibular symptoms: neck pain and headaches  Type of dizziness: Lightheadedness/Faint  Frequency: everyday  Duration: minutes to hours  Aggravating factors: Worse in the morning and bending forward, stress, getting up in the morning  Relieving factors: closing eyes and slow movements  Progression of symptoms: unchanged  OCULOMOTOR EXAM:  Ocular Alignment: normal  Ocular ROM: No Limitations  Spontaneous Nystagmus: absent  Gaze-Induced Nystagmus: absent  Smooth Pursuits: saccades mild  Saccades: dysmetria  Convergence/Divergence:  cm   VESTIBULAR - OCULAR REFLEX:   Slow VOR: Positive Bilaterally  VOR Cancellation: Unable to Maintain Gaze    POSITIONAL TESTING: Right Dix-Hallpike: no nystagmus Left Dix-Hallpike: no nystagmus  MOTION SENSITIVITY:  Reports that she would rate her dizziness a 2-3/10 most times but does increase with bending forward, quick movements and turns   OTHOSTATICS: not done  VESTIBULAR TREATMENT:                                                                                                   DATE:  11/28/23 Goals reviewed for UPOC Dizziness remains, up to 10/10 first thing in the morning. It sometimes returns toward the end of the day and is often accompanied by H/A. Driving does not seem to aggravate the dizziness or headache. Patient reports her dizziness is at 3-4 today, decline vestibular re-assessment out of fear of getting sick.  10/31/23 NuStep L5 x 6 minutes. Orthostatic BP testing Supine 124/82 77 BPM Reports some dizziness after moving sup to sit Sitting immediate 119/79 81 BPM, 3 minutes 97/77 85 BPM Standing immediate 102/80 86 BPM Education regarding BP and safety  Education regarding staying hydrated and how to determine if  she is well hydrated. VOR-Seated, horizontal, 3 x 10 head turns, unable to complete 10 due to dizziness rising to 4/10 from 2/10 VOR x2- Seated horizontal, 2 x 10 head turns, unable to complete.  10/24/23 Dizziness training including standing on Air ex pad, standing with narrow BOS on pad, standing on solid ground with eyes closed and then with narrow BOS and eyes closed. Able to get to 30 sec for each without significant increase in her dizziness. Ambulation x 20' with head turns, x 2. Dizziness rose to 4, but dropped back to 2. Repeat with head nods, dizziness increased to 5, but dropped again.  10/10/23 NuStep L5x43mins  VOR x1 and x2 standing 20 reps each Walking on beam  BPPV canal testing- negative with R Dix Hallpike but positive with horizontal canal   09/29/23 NuStep  L4 x78mins  Calf stretch on slant 15s x3  Calf raises 2x10 VOR x1 and x2 seated Standing on airex EO, EC, head turns   09/13/23 VOR x1  VOR x2 Functional gait assessment  Education about vestibular system and how VOR exercises will be beneficial  Standing on airex EO, EC, head turns    08/29/23 Dix halpike negative bilaterally, she felt supported and did not get dizzy BP sitting 134/110 BP standing 130/100 BP Supine  130/100 Reviewed VOR, performed 2x Education on the VOR, vs BPVVV, vs orthostatics, she has a lot of questions that we went over   PATIENT EDUCATION: Education details: HEP and POC Person educated: Patient Education method: Programmer, Multimedia, Demonstration, Verbal cues, and Handouts Education comprehension: verbalized understanding  HOME EXERCISE PROGRAM: VOR exercises   GOALS: Goals reviewed with patient? Yes  SHORT TERM GOALS: Target date: 08/27/23  Independent with initial HEP Goal status: 10/24/23- Patient reports she inconsistently performs due to the discomfort, ongoing  LONG TERM GOALS: Target date: 11/14/23  Independent with advanced HEP Goal status: 11/28/23- She reports that  she has not performed the exercises much since last visit. Reviewed and updated as appropriate, ongoing.  2.  Assess BPPV again and orthostatics Goal status: MET 08/29/23  3.  Report that she has 50% less dizziness with getting up in the AM Goal status: 11/28/23-Up to 10/10, 4-5/7 mornings. Lasts about 5 minutes, improved if she gets up slow. Ongoing  4.  No dizziness with looking up or down Goal status: 10/24/23-up to 5/10 after walking 20' with head nods. Ongoing   ASSESSMENT:  CLINICAL IMPRESSION: Patient is a 23 y.o. female who was seen today for physical therapy treatment for dizziness. She has missed some appointments due to illness. Arrives today after seeing the Dr due to nausea and diarrhea. Re-assessed her status for goals, but she declined any vestibular re-assessment, stating her baseline dizziness is 3-4/10 and she is fearful that the testing may cause her to feel worse. Will extend her POC in order to re-assess her upon her next visit. Educated to [place] her appointment if she is still feeling the baseline dizziness.  OBJECTIVE IMPAIRMENTS: Abnormal gait, decreased activity tolerance, decreased balance, decreased coordination, decreased endurance, decreased mobility, difficulty walking, decreased ROM, decreased strength, dizziness, increased muscle spasms, impaired flexibility, impaired vision/preception, improper body mechanics, postural dysfunction, and pain.   REHAB POTENTIAL: Good  CLINICAL DECISION MAKING: Stable/uncomplicated  EVALUATION COMPLEXITY: Low   PLAN:  PT FREQUENCY: 1-2x/week  PT DURATION: 12 weeks  PLANNED INTERVENTIONS: Therapeutic exercises, Therapeutic activity, Neuromuscular re-education, Balance training, Gait training, Patient/Family education, Self Care, Joint mobilization, Vestibular training, Canalith repositioning, Visual/preceptual remediation/compensation, Dry Needling, Electrical stimulation, Spinal mobilization, Cryotherapy, Moist heat,  Taping, Traction, Ultrasound, and Manual therapy  PLAN FOR NEXT SESSION: continue to assess VOR and work on dizziness    Devere CHRISTELLA Mean, DPT 11/28/2023, 5:03 PM

## 2023-11-28 NOTE — Patient Instructions (Signed)
 It was great to see you! Thank you for allowing me to participate in your care!  I recommend that you always bring your medications to each appointment as this makes it easy to ensure we are on the correct medications and helps us  not miss when refills are needed.  Our plans for today:  - Gastrointestinal Illness (GI Virus) You have a GI virus, that should get better with time. You will want to continue hydration with plenty of fluids (Gatorade/juice/coconut water). Your diarrhea should improve with time. Take it slow with meals that are bland and have a lot of fluid to them (soups and the like).   You should start feeling better by the end of the week. If not getting better/getting worse, make follow up appointment to be seen.   *If your mouth/lips get dry, or are not peeing at least once in 12-24 hours, you may be dehydrated and need IV fluids. You will want to take go to the Emergency Department.    Take care and seek immediate care sooner if you develop any concerns.   Dr. Penne Rhein, MD [facility]

## 2023-11-28 NOTE — Telephone Encounter (Signed)
 Called to inform patient that her and her son's testings were negative for flu and COVID.  Patient likely has some other GI virus causing her GI symptoms (diarrhea, stomachache, vomiting)

## 2023-11-28 NOTE — Assessment & Plan Note (Addendum)
 Patient comes in with 2 to 3 days of GI illness.  Patient appreciates diarrhea that began on Saturday, preceded by nausea and headache.  Patient appreciates some bodyaches and some weakness.  As well as decreased appetite.  Patient's son with similar symptoms.  Patient denies any blood in diarrhea, reports it slowed down since the start.  Patient denies any fevers.  Patient symptoms most concerning for GI virus, given that son has similar symptoms.  Will swab for flu/COVID, and consider treatment if positive. Will recommend supportive care, encourage p.o. hydration, with return precautions for ED. - Supportive care, encourage p.o. hydration - Follow-up if not improved by end of week - ED precautions for dehydration - Flu/COVID swabs

## 2023-11-28 NOTE — Progress Notes (Signed)
  SUBJECTIVE:   CHIEF COMPLAINT / HPI:   Sick GI Symptoms Started with body aches and nausea, then started having diarrhea. Still having stomach bloating. Symptoms started on Saturday. Also appreciates nausea and HA. Today stomach feels weak, and lost of appetite. Will have to have BM within an hour of eating. No fever's, but does have a little bit of a cough. No blood in the stool.    PERTINENT  PMH / PSH:    OBJECTIVE:  BP (!) 99/57   Pulse 86   Temp 98.1 F (36.7 C) (Oral)   Ht 5' 5 (1.651 m)   Wt (!) 309 lb 12.8 oz (140.5 kg)   SpO2 100%   BMI 51.55 kg/m  Physical Exam Constitutional:      General: She is not in acute distress.    Appearance: She is well-developed. She is ill-appearing.  Cardiovascular:     Rate and Rhythm: Normal rate and regular rhythm.     Heart sounds: Normal heart sounds. No murmur heard.    No friction rub. No gallop.  Pulmonary:     Effort: Pulmonary effort is normal. No respiratory distress.     Breath sounds: Normal breath sounds. No stridor. No wheezing, rhonchi or rales.  Abdominal:     General: Bowel sounds are normal. There is no distension.     Palpations: Abdomen is soft. There is no shifting dullness, fluid wave or mass.     Tenderness: There is no abdominal tenderness. There is no guarding.  Neurological:     Mental Status: She is alert.      ASSESSMENT/PLAN:   Assessment & Plan Gastrointestinal complaints Patient comes in with 2 to 3 days of GI illness.  Patient appreciates diarrhea that began on Saturday, preceded by nausea and headache.  Patient appreciates some bodyaches and some weakness.  As well as decreased appetite.  Patient's son with similar symptoms.  Patient denies any blood in diarrhea, reports it slowed down since the start.  Patient denies any fevers.  Patient symptoms most concerning for GI virus, given that son has similar symptoms.  Will swab for flu/COVID, and consider treatment if positive. Will recommend  supportive care, encourage p.o. hydration, with return precautions for ED. - Supportive care, encourage p.o. hydration - Follow-up if not improved by end of week - ED precautions for dehydration - Flu/COVID swabs No follow-ups on file. Penne Rhein, MD 11/28/2023, 2:20 PM PGY-3, [facility]

## 2023-12-01 ENCOUNTER — Telehealth: Payer: Self-pay

## 2023-12-01 DIAGNOSIS — M7661 Achilles tendinitis, right leg: Secondary | ICD-10-CM

## 2023-12-01 DIAGNOSIS — F4311 Post-traumatic stress disorder, acute: Secondary | ICD-10-CM | POA: Diagnosis not present

## 2023-12-01 NOTE — Telephone Encounter (Signed)
Patient calls nurse line regarding continued foot pain. She reports finishing medication that was prescribed (Naproxen) and that she is still experiencing pain.   She is requesting referral to specialist.   Patient saw Dr. Laroy Apple for this concern on 11/17/23. Will forward this message to her for further assistance.   Veronda Prude, RN

## 2023-12-02 NOTE — Telephone Encounter (Signed)
Patient have a sports medicine appt on 12/05/2023 @11 

## 2023-12-02 NOTE — Telephone Encounter (Signed)
Referral placed for sports medicine.

## 2023-12-05 ENCOUNTER — Ambulatory Visit: Payer: Medicaid Other | Admitting: Physical Therapy

## 2023-12-05 ENCOUNTER — Ambulatory Visit (INDEPENDENT_AMBULATORY_CARE_PROVIDER_SITE_OTHER): Payer: Medicaid Other | Admitting: Family Medicine

## 2023-12-05 ENCOUNTER — Encounter: Payer: Self-pay | Admitting: Physical Therapy

## 2023-12-05 ENCOUNTER — Ambulatory Visit: Payer: Self-pay

## 2023-12-05 VITALS — BP 107/81 | Ht 66.0 in | Wt 309.0 lb

## 2023-12-05 DIAGNOSIS — M542 Cervicalgia: Secondary | ICD-10-CM

## 2023-12-05 DIAGNOSIS — R42 Dizziness and giddiness: Secondary | ICD-10-CM | POA: Diagnosis not present

## 2023-12-05 DIAGNOSIS — M7661 Achilles tendinitis, right leg: Secondary | ICD-10-CM | POA: Diagnosis not present

## 2023-12-05 NOTE — Patient Instructions (Addendum)
You have peroneal tendinitis, to a lesser extent achilles tendinitis as well. Arch support is very important as well - consider spencos or superfeet insoles when up and walking around. Avoid barefoot walking, flat shoes. Heat 15 minutes at a time as needed. Do home exercises daily as directed. Consider physical therapy if not improving. Aleve or ibuprofen if needed. Follow up with me in 5-6 weeks.

## 2023-12-05 NOTE — Progress Notes (Signed)
PCP: Lockie Mola, MD  Subjective:   HPI: Patient is a 23 y.o. female here for right ankle pain.  Patient reports she started to get pain in her right ankle about 1.5 months ago. No acute injury or trauma. She states the pain was initially on the posterior aspect of the ankle and heel but has been bothering her laterally and anteriorly along with the dorsal foot. At times will have difficulty putting weight on her right leg due to the pain. Sometimes has some swelling but no bruising. No issues with the left ankle. Has tried ibuprofen with some benefit.  Past Medical History:  Diagnosis Date   Acne vulgaris    Cough 11/17/2021   COVID-19 12/11/2020   Dizziness 08/23/2019   Folliculitis 08/29/2019   GERD (gastroesophageal reflux disease)    Helicobacter pylori gastritis    Hidradenitis suppurativa    Neuropathy    BUE   Pharyngitis 11/13/2019   Pre-diabetes    Routine screening for STI (sexually transmitted infection) 12/26/2020   Seasonal allergies    Sleep apnea    Sleep deprivation 09/28/2018   Snoring 01/22/2020   Sore throat 11/13/2019   Strep pharyngitis 05/29/2022   Viral URI 02/01/2020    Current Outpatient Medications on File Prior to Visit  Medication Sig Dispense Refill   albuterol (VENTOLIN HFA) 108 (90 Base) MCG/ACT inhaler Inhale 2 puffs into the lungs every 4 (four) hours as needed for wheezing or shortness of breath. 1 each 0   benzonatate (TESSALON) 200 MG capsule Take 1 capsule (200 mg total) by mouth 3 (three) times daily as needed for cough. 21 capsule 0   cetirizine (ZYRTEC) 10 MG tablet Take 1 tablet (10 mg total) by mouth daily as needed for allergies. 30 tablet 11   fluticasone (FLONASE) 50 MCG/ACT nasal spray PLACE 2 SPRAYS INTO BOTH NOSTRILS DAILY AS NEEDED 16 g 0   promethazine-dextromethorphan (PROMETHAZINE-DM) 6.25-15 MG/5ML syrup Take 5 mLs by mouth every 6 (six) hours as needed for cough. 200 mL 0   No current facility-administered  medications on file prior to visit.    Past Surgical History:  Procedure Laterality Date   NO PAST SURGERIES     TOOTH EXTRACTION Bilateral 08/25/2021   Procedure: DENTAL RESTORATION/EXTRACTIONS;  Surgeon: Ocie Doyne, DMD;  Location: [facility];  Service: Oral Surgery;  Laterality: Bilateral;    No Known Allergies  BP 107/81   Ht 5\' 6"  (1.676 m)   Wt (!) 309 lb (140.2 kg)   BMI 49.87 kg/m       No data to display              No data to display              Objective:  Physical Exam:  Gen: NAD, comfortable in exam room  Right ankle: No gross deformity, swelling, ecchymoses Full range of motion without pain Mild tenderness to palpation achilles insertion.  No other tenderness. Negative ant drawer and negative talar tilt.   NV intact distally.  MSK u/s right ankle: No effusion.  Achilles is mildly thickened but otherwise normal.  Peroneal tendons with mild tenosynovitis but without tear.  ATFL is intact.   Assessment & Plan:  1.  Right ankle pain: Secondary to peroneal tendinitis/tenosynovitis and to a lesser extent Achilles tendinopathy.  Stressed importance of arch support and discussed over-the-counter inserts that would be beneficial.  Avoid barefoot walking and flat shoes.  Heat as needed.  Home exercises reviewed.  Consider physical therapy if not improving.  Aleve or ibuprofen if needed also.  Follow-up in 5 to 6 weeks.

## 2023-12-05 NOTE — Therapy (Signed)
OUTPATIENT PHYSICAL THERAPY VESTIBULAR TREATMENT   Patient Name: Lauren Obrien MRN: [id number] DOB:05-03-2001, 23 y.o., female Today's Date: 12/05/2023  END OF SESSION:  PT End of Session - 12/05/23 1638     Visit Number 9    Date for PT Re-Evaluation 12/19/23    PT Start Time 1634    PT Stop Time 1712    PT Time Calculation (min) 38 min            Past Medical History:  Diagnosis Date   Acne vulgaris    Cough 11/17/2021   COVID-19 12/11/2020   Dizziness 08/23/2019   Folliculitis 08/29/2019   GERD (gastroesophageal reflux disease)    Helicobacter pylori gastritis    Hidradenitis suppurativa    Neuropathy    BUE   Pharyngitis 11/13/2019   Pre-diabetes    Routine screening for STI (sexually transmitted infection) 12/26/2020   Seasonal allergies    Sleep apnea    Sleep deprivation 09/28/2018   Snoring 01/22/2020   Sore throat 11/13/2019   Strep pharyngitis 05/29/2022   Viral URI 02/01/2020   Past Surgical History:  Procedure Laterality Date   NO PAST SURGERIES     TOOTH EXTRACTION Bilateral 08/25/2021   Procedure: DENTAL RESTORATION/EXTRACTIONS;  Surgeon: Ocie Doyne, DMD;  Location: [facility];  Service: Oral Surgery;  Laterality: Bilateral;   Patient Active Problem List   Diagnosis Date Noted   Gastrointestinal complaints 11/28/2023   Polydipsia 10/11/2023   Chronic midline low back pain without sciatica 06/10/2023   Nail discoloration 03/28/2023   Attempted IUD removal, unsuccessful 11/17/2022   Umbilical discharge 05/04/2022   Paresthesias 03/20/2022   Right wrist tendinitis 06/06/2020   Morbid obesity (HCC) 01/22/2020   Chronic fatigue 01/22/2020   Excessive daytime sleepiness 01/22/2020   OSA (obstructive sleep apnea) 01/22/2020   Oropharyngeal dysphagia 01/11/2020   Prediabetes 08/29/2019   Dizziness 08/23/2019   Memory difficulties 07/01/2019   Chronic tension type headache 05/29/2019   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   Hidradenitis suppurativa 03/04/2019   Carpal tunnel syndrome of left wrist 01/04/2019   Learning difficulty 12/16/2018   Left wrist tendinitis 12/01/2018   Irregular menstrual bleeding 04/03/2018   Acid reflux 01/21/2016   Chronic constipation 07/24/2015    PCP: Marsh Dolly, MD REFERRING PROVIDER: Terisa Starr, MD  REFERRING DIAG: vertigo, dizziness  THERAPY DIAG:  Cervicalgia  Dizziness and giddiness  ONSET DATE: 08/13/23  Rationale for Evaluation and Treatment: Rehabilitation  SUBJECTIVE:   SUBJECTIVE STATEMENT: Patient reports that she was very dizzy for a whole day. She doesn't remember the day. When she woke up the next day, the dizziness was better. She felt like the she was spinning, but has great difficulty describing it. It was her typical dizziness. Dizziness is currently level 4  Pt accompanied by: self  PERTINENT HISTORY: GERD  PAIN:  Are you having pain? Yes: NPRS scale: 5/10 Pain location: upper traps and neck Pain description: tight, HA Aggravating factors: stress Relieving factors: rest  PRECAUTIONS: None  RED FLAGS: None   WEIGHT BEARING RESTRICTIONS: No  FALLS: Has patient fallen in last 6 months? 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  DIAGNOSTIC FINDINGS: none  COGNITION: Overall  cognitive status: Within functional limits for tasks assessed   SENSATION: WFL  PALPATION:  Very tight and tender in the upper traps  POSTURE:  rounded shoulders and forward head  Cervical ROM:  all motions increased dizziness a little  Active A/PROM (deg) eval  Flexion WNL  Extension Decreased 25%  Right lateral  flexion WNL  Left lateral flexion WNL  Right rotation WNL  Left rotation WNL  (Blank rows = not tested)  STRENGTH: UE strength 4-/5  VESTIBULAR ASSESSMENT:  DHI:  58%  SYMPTOM BEHAVIOR:   Non-Vestibular symptoms: neck pain and headaches  Type of dizziness: Lightheadedness/Faint  Frequency: everyday  Duration: minutes to hours  Aggravating factors: Worse in the morning and bending forward, stress, getting up in the morning  Relieving factors: closing eyes and slow movements  Progression of symptoms: unchanged  OCULOMOTOR EXAM:  Ocular Alignment: normal  Ocular ROM: No Limitations  Spontaneous Nystagmus: absent  Gaze-Induced Nystagmus: absent  Smooth Pursuits: saccades mild  Saccades: dysmetria  Convergence/Divergence:  cm   VESTIBULAR - OCULAR REFLEX:   Slow VOR: Positive Bilaterally  VOR Cancellation: Unable to Maintain Gaze    POSITIONAL TESTING: Right Dix-Hallpike: no nystagmus Left Dix-Hallpike: no nystagmus  MOTION SENSITIVITY:  Reports that she would rate her dizziness a 2-3/10 most times but does increase with bending forward, quick movements and turns   OTHOSTATICS: not done  VESTIBULAR TREATMENT:                                                                                                   DATE:  12/05/23 NuStep L4 x 4 minutes Balance screen- patient demonstrated static stand, narrow BOS, then eyes closed on stable ground, then repeated with feet on airex pad. Stable x 30 sec in each position. VOR seated horizontal x 20 sec, dizziness to 5, vertical x 10 sec, dizziness to 5. Standing on airex, catch with ball tossed to each side to require head and body turning. Approximately 20 reps, dizziness to 5. Seated VOR horizontal and vert, increased dizziness 2 levels Ambulation in straight line with head turns horizontal and vertical, increased dizziness, more so with vertical head motion. Ambulation reaching down and to the side, alternating each side. Dizziness  increased to Level 5, repeated x 3, allowing dizziness to return to baseline after each round.  11/28/23 Goals reviewed for UPOC Dizziness remains, up to 10/10 first thing in the morning. It sometimes returns toward the end of the day and is often accompanied by H/A. Driving does not seem to aggravate the dizziness or headache. Patient reports her dizziness is at 3-4 today, decline vestibular re-assessment out of fear of getting sick.  10/31/23 NuStep L5 x 6 minutes. Orthostatic BP testing Supine 124/82 77 BPM Reports some dizziness after moving sup to sit Sitting immediate 119/79 81 BPM, 3 minutes 97/77 85 BPM Standing immediate 102/80 86 BPM Education regarding BP and safety  Education regarding staying hydrated and how to determine if she is well hydrated. VOR-Seated, horizontal, 3 x 10 head turns, unable to complete 10 due to dizziness rising to 4/10 from 2/10  VOR x2- Seated horizontal, 2 x 10 head turns, unable to complete.  10/24/23 Dizziness training including standing on Air ex pad, standing with narrow BOS on pad, standing on solid ground with eyes closed and then with narrow BOS and eyes closed. Able to get to 30 sec for each without significant increase in her dizziness. Ambulation x 20' with head turns, x 2. Dizziness rose to 4, but dropped back to 2. Repeat with head nods, dizziness increased to 5, but dropped again.  10/10/23 NuStep L5x23mins  VOR x1 and x2 standing 20 reps each Walking on beam  BPPV canal testing- negative with R Gilberto Better but positive with horizontal canal   09/29/23 NuStep L4 x47mins  Calf stretch on slant 15s x3  Calf raises 2x10 VOR x1 and x2 seated Standing on airex EO, EC, head turns   09/13/23 VOR x1  VOR x2 Functional gait assessment  Education about vestibular system and how VOR exercises will be beneficial  Standing on airex EO, EC, head turns   08/29/23 Dix halpike negative bilaterally, she felt supported and did not get dizzy BP  sitting 134/110 BP standing 130/100 BP Supine  130/100 Reviewed VOR, performed 2x Education on the VOR, vs BPVVV, vs orthostatics, she has a lot of questions that we went over   PATIENT EDUCATION: Education details: HEP and POC Person educated: Patient Education method: Programmer, multimedia, Demonstration, Verbal cues, and Handouts Education comprehension: verbalized understanding  HOME EXERCISE PROGRAM: VOR exercises   GOALS: Goals reviewed with patient? Yes  SHORT TERM GOALS: Target date: 08/27/23  Independent with initial HEP Goal status: 10/24/23- Patient reports she inconsistently performs due to the discomfort, ongoing  LONG TERM GOALS: Target date: 11/14/23  Independent with advanced HEP Goal status: 11/28/23- She reports that she has not performed the exercises much since last visit. Reviewed and updated as appropriate, ongoing.  2.  Assess BPPV again and orthostatics Goal status: MET 08/29/23  3.  Report that she has 50% less dizziness with getting up in the AM Goal status: 11/28/23-Up to 10/10, 4-5/7 mornings. Lasts about 5 minutes, improved if she gets up slow. Ongoing  4.  No dizziness with looking up or down Goal status: 10/24/23-up to 5/10 after walking 20' with head nods. Ongoing   ASSESSMENT:  CLINICAL IMPRESSION: Patient is a 23 y.o. female who was seen today for physical therapy treatment for dizziness. She has missed some appointments due to illness. Arrives today after seeing the Dr due to nausea and diarrhea.  Performed multiple tests for dizziness and then performed multiple activities to trigger the dizziness to build accomodation.  OBJECTIVE IMPAIRMENTS: Abnormal gait, decreased activity tolerance, decreased balance, decreased coordination, decreased endurance, decreased mobility, difficulty walking, decreased ROM, decreased strength, dizziness, increased muscle spasms, impaired flexibility, impaired vision/preception, improper body mechanics, postural  dysfunction, and pain.   REHAB POTENTIAL: Good  CLINICAL DECISION MAKING: Stable/uncomplicated  EVALUATION COMPLEXITY: Low   PLAN:  PT FREQUENCY: 1-2x/week  PT DURATION: 12 weeks  PLANNED INTERVENTIONS: Therapeutic exercises, Therapeutic activity, Neuromuscular re-education, Balance training, Gait training, Patient/Family education, Self Care, Joint mobilization, Vestibular training, Canalith repositioning, Visual/preceptual remediation/compensation, Dry Needling, Electrical stimulation, Spinal mobilization, Cryotherapy, Moist heat, Taping, Traction, Ultrasound, and Manual therapy  PLAN FOR NEXT SESSION: continue to assess VOR and work on dizziness    Iona Beard, DPT 12/05/2023, 5:15 PM

## 2023-12-08 DIAGNOSIS — F4311 Post-traumatic stress disorder, acute: Secondary | ICD-10-CM | POA: Diagnosis not present

## 2023-12-10 ENCOUNTER — Encounter (HOSPITAL_COMMUNITY): Payer: Self-pay

## 2023-12-10 ENCOUNTER — Emergency Department (HOSPITAL_COMMUNITY)
Admission: EM | Admit: 2023-12-10 | Discharge: 2023-12-10 | Disposition: A | Discharge status: Home / Self Care | Payer: Medicaid Other | Attending: Emergency Medicine | Admitting: Emergency Medicine

## 2023-12-10 ENCOUNTER — Other Ambulatory Visit: Payer: Self-pay

## 2023-12-10 DIAGNOSIS — J069 Acute upper respiratory infection, unspecified: Secondary | ICD-10-CM | POA: Insufficient documentation

## 2023-12-10 DIAGNOSIS — Z20822 Contact with and (suspected) exposure to covid-19: Secondary | ICD-10-CM | POA: Diagnosis not present

## 2023-12-10 DIAGNOSIS — J029 Acute pharyngitis, unspecified: Secondary | ICD-10-CM | POA: Diagnosis present

## 2023-12-10 LAB — RESP PANEL BY RT-PCR (RSV, FLU A&B, COVID)  RVPGX2
Influenza A by PCR: NEGATIVE
Influenza B by PCR: NEGATIVE
Resp Syncytial Virus by PCR: NEGATIVE
SARS Coronavirus 2 by RT PCR: NEGATIVE

## 2023-12-10 LAB — GROUP A STREP BY PCR: Group A Strep by PCR: NOT DETECTED

## 2023-12-10 MED ORDER — CETIRIZINE HCL 10 MG PO TABS: 10.0000 mg | ORAL_TABLET | Freq: Every day | ORAL | 0 refills | Status: DC | PRN

## 2023-12-10 MED ORDER — DEXAMETHASONE 4 MG PO TABS
4.0000 mg | ORAL_TABLET | Freq: Once | ORAL | Status: AC
  Administered 2023-12-10: 4 mg via ORAL
  Filled 2023-12-10: qty 1

## 2023-12-10 MED ORDER — BENZONATATE 100 MG PO CAPS: 100.0000 mg | ORAL_CAPSULE | Freq: Three times a day (TID) | ORAL | 0 refills | Status: DC

## 2023-12-10 NOTE — ED Provider Notes (Signed)
[place] EMERGENCY DEPARTMENT AT [facility] Provider Note   CSN: [id number] Arrival date & time: 12/10/23  4098     History  Chief Complaint  Patient presents with   Sore Throat    Lauren Obrien is a 23 y.o. female.  Patient with noncontributory past medical history presents today with complaints of congestion and sore throat.  States that same began 3 days ago and has been persistent since then.  States she feels like the back of her throat is swollen, however she is tolerating secretions and not had any changes to her voice.  She has not had any fevers or chills.  No known sick contacts.  She has not tried anything for symptoms. Denies chest pain or shortness of breath.   The history is provided by the patient. No language interpreter was used.  Sore Throat       Home Medications Prior to Admission medications   Medication Sig Start Date End Date Taking? Authorizing Provider  fluconazole (DIFLUCAN) 150 MG tablet Take by mouth. 09/26/23  Yes [provider]  Multiple Vitamin (MULTIVITAMIN) capsule Take 1 capsule by mouth daily. 03/27/20  Yes [provider]  albuterol (VENTOLIN HFA) 108 (90 Base) MCG/ACT inhaler Inhale 2 puffs into the lungs every 4 (four) hours as needed for wheezing or shortness of breath. 09/09/23   Piontek, Denny Peon, MD  benzonatate (TESSALON) 200 MG capsule Take 1 capsule (200 mg total) by mouth 3 (three) times daily as needed for cough. 09/09/23   Piontek, Denny Peon, MD  cetirizine (ZYRTEC) 10 MG tablet Take 1 tablet (10 mg total) by mouth daily as needed for allergies. 10/15/22   Jerre Simon, MD  fluticasone (FLONASE) 50 MCG/ACT nasal spray PLACE 2 SPRAYS INTO BOTH NOSTRILS DAILY AS NEEDED 11/17/23   Levin Erp, MD  promethazine-dextromethorphan (PROMETHAZINE-DM) 6.25-15 MG/5ML syrup Take 5 mLs by mouth every 6 (six) hours as needed for cough. 09/03/23   Landis Martins, PA-C      Allergies    Patient has no known  allergies.    Review of Systems   Review of Systems  HENT:  Positive for congestion and sore throat.   All other systems reviewed and are negative.   Physical Exam Updated Vital Signs BP (!) 149/99   Pulse 88   Temp (!) 97.5 F (36.4 C)   Resp 17   SpO2 100%  Physical Exam Vitals and nursing note reviewed.  Constitutional:      General: She is not in acute distress.    Appearance: Normal appearance. She is obese. She is not ill-appearing, toxic-appearing or diaphoretic.  HENT:     Head: Normocephalic and atraumatic.     Mouth/Throat:     Mouth: Mucous membranes are moist.     Pharynx: Uvula midline. No oropharyngeal exudate, posterior oropharyngeal erythema or uvula swelling.     Tonsils: No tonsillar exudate or tonsillar abscesses. 2+ on the right. 2+ on the left.  Eyes:     Conjunctiva/sclera: Conjunctivae normal.  Cardiovascular:     Rate and Rhythm: Normal rate and regular rhythm.     Heart sounds: Normal heart sounds.  Pulmonary:     Effort: Pulmonary effort is normal. No respiratory distress.     Breath sounds: Normal breath sounds.  Abdominal:     Palpations: Abdomen is soft.     Tenderness: There is no abdominal tenderness.  Musculoskeletal:        General: Normal range of motion.  Cervical back: Normal range of motion and neck supple.  Lymphadenopathy:     Cervical: No cervical adenopathy.  Skin:    General: Skin is warm and dry.  Neurological:     General: No focal deficit present.     Mental Status: She is alert.  Psychiatric:        Mood and Affect: Mood normal.        Behavior: Behavior normal.     ED Results / Procedures / Treatments   Labs (all labs ordered are listed, but only abnormal results are displayed) Labs Reviewed  RESP PANEL BY RT-PCR (RSV, FLU A&B, COVID)  [id number]  GROUP A STREP BY PCR    EKG None  Radiology No results found.  Procedures Procedures    Medications Ordered in ED Medications  dexamethasone (DECADRON)  tablet 4 mg (4 mg Oral Given 12/10/23 1610)    ED Course/ Medical Decision Making/ A&P                                 Medical Decision Making Risk Prescription drug management.   Patient presents today with complaints of cough and congestion x 3 days.  They are afebrile, nontoxic-appearing, and in no acute distress with reassuring vital signs.  Physical exam reveals lung sounds clear to auscultation in all fields. No indication for CXR imaging at this time. Patient negative for COVID, flu, and RSV as well as strep.  However, patient's symptoms likely due to URI, likely viral etiology.  Discussed with patient that there is no indication for antibiotics for viral infections. Will send for Tessalon for cough and recommend close outpatient follow-up with return precautions. OTC recommendations given as well. Given 1 dose of steroids for tonsillar swelling as well with improvement. Evaluation and diagnostic testing in the emergency department does not suggest an emergent condition requiring admission or immediate intervention beyond what has been performed at this time.  Plan for discharge with close PCP follow-up.  Patient is understanding and amenable with plan, educated on red flag symptoms that would prompt immediate return.  Patient discharged in stable condition.  Final Clinical Impression(s) / ED Diagnoses Final diagnoses:  Upper respiratory tract infection, unspecified type    Rx / DC Orders ED Discharge Orders          Ordered    benzonatate (TESSALON) 100 MG capsule  Every 8 hours        12/10/23 0930          An After Visit Summary was printed and given to the patient.     Vear Clock 12/10/23 [id number]    Virgina Norfolk, DO 12/10/23 1349

## 2023-12-10 NOTE — Discharge Instructions (Signed)
Your work-up in the ER today was reassuring for acute findings. You were swabbed for COVID, flu, RSV, and strep which were negative. However, your symptoms are still likely related to an upper respiratory infection. As these are almost always viral in nature, no antibiotics are indicated. I recommend that you get plenty of rest and focus on symptomatic relief which includes Cepacol throat lozenges for sore throat, Mucinex DM (green box) for congestion, and tylenol/ibuprofen as needed for fevers and bodyaches. I have also given you a prescription for tessalon which is a cough medication for you to take as prescribed as needed for management of your symptoms. I also recommend:  Increased fluid intake. Sports drinks offer valuable electrolytes, sugars, and fluids.  Breathing heated mist or steam (vaporizer or shower).  Eating chicken soup or other clear broths, and maintaining good nutrition.   Increasing usage of your inhaler if you have asthma.  Return to work when your temperature has returned to normal.  Gargle warm salt water and spit it out for sore throat. Take benadryl or Zyrtec to decrease sinus secretions.  Follow Up: Follow up with your primary care doctor in 5-7 days for recheck of ongoing symptoms.  Return to emergency department for emergent changing or worsening of symptoms.

## 2023-12-10 NOTE — ED Triage Notes (Signed)
Patient arrives POV for sore throat and nasal congestion x3 days. Redness noted to the back of throat.

## 2023-12-12 ENCOUNTER — Ambulatory Visit: Payer: Medicaid Other | Admitting: Physical Therapy

## 2023-12-15 DIAGNOSIS — F4311 Post-traumatic stress disorder, acute: Secondary | ICD-10-CM | POA: Diagnosis not present

## 2023-12-19 ENCOUNTER — Ambulatory Visit: Payer: Medicaid Other | Attending: Family Medicine

## 2023-12-19 DIAGNOSIS — M542 Cervicalgia: Secondary | ICD-10-CM | POA: Insufficient documentation

## 2023-12-19 DIAGNOSIS — R42 Dizziness and giddiness: Secondary | ICD-10-CM | POA: Diagnosis not present

## 2023-12-19 NOTE — Therapy (Signed)
OUTPATIENT PHYSICAL THERAPY VESTIBULAR TREATMENT Progress Note Reporting Period 08/15/23 to 12/19/23  See note below for Objective Data and Assessment of Progress/Goals.      Patient Name: Lauren Obrien MRN: [id number] DOB:2001-04-24, 23 y.o., female Today's Date: 12/19/2023  END OF SESSION:  PT End of Session - 12/19/23 1702     Visit Number 10    Date for PT Re-Evaluation 12/19/23    PT Start Time 1700    PT Stop Time 1745    PT Time Calculation (min) 45 min             Past Medical History:  Diagnosis Date   Acne vulgaris    Cough 11/17/2021   COVID-19 12/11/2020   Dizziness 08/23/2019   Folliculitis 08/29/2019   GERD (gastroesophageal reflux disease)    Helicobacter pylori gastritis    Hidradenitis suppurativa    Neuropathy    BUE   Pharyngitis 11/13/2019   Pre-diabetes    Routine screening for STI (sexually transmitted infection) 12/26/2020   Seasonal allergies    Sleep apnea    Sleep deprivation 09/28/2018   Snoring 01/22/2020   Sore throat 11/13/2019   Strep pharyngitis 05/29/2022   Viral URI 02/01/2020   Past Surgical History:  Procedure Laterality Date   NO PAST SURGERIES     TOOTH EXTRACTION Bilateral 08/25/2021   Procedure: DENTAL RESTORATION/EXTRACTIONS;  Surgeon: Ocie Doyne, DMD;  Location: [facility];  Service: Oral Surgery;  Laterality: Bilateral;   Patient Active Problem List   Diagnosis Date Noted   Gastrointestinal complaints 11/28/2023   Polydipsia 10/11/2023   Chronic midline low back pain without sciatica 06/10/2023   Nail discoloration 03/28/2023   Attempted IUD removal, unsuccessful 11/17/2022   Umbilical discharge 05/04/2022   Paresthesias 03/20/2022   Right wrist tendinitis 06/06/2020   Morbid obesity (HCC) 01/22/2020   Chronic fatigue 01/22/2020   Excessive daytime sleepiness 01/22/2020   OSA (obstructive sleep apnea) 01/22/2020   Oropharyngeal dysphagia 01/11/2020   Prediabetes 08/29/2019   Dizziness  08/23/2019   Memory difficulties 07/01/2019   Chronic tension type headache 05/29/2019   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   Hidradenitis suppurativa 03/04/2019   Carpal tunnel syndrome of left wrist 01/04/2019   Learning difficulty 12/16/2018   Left wrist tendinitis 12/01/2018   Irregular menstrual bleeding 04/03/2018   Acid reflux 01/21/2016   Chronic constipation 07/24/2015    PCP: Marsh Dolly, MD REFERRING PROVIDER: Terisa Starr, MD  REFERRING DIAG: vertigo, dizziness  THERAPY DIAG:  Cervicalgia  Dizziness and giddiness  ONSET DATE: 08/13/23  Rationale for Evaluation and Treatment: Rehabilitation  SUBJECTIVE:   SUBJECTIVE STATEMENT: Same thing, dizziness is still there. I have to do better with the exercises, but I am so busy and it triggers my dizziness.   Pt accompanied by: self  PERTINENT HISTORY: GERD  PAIN:  Are you having pain? Yes: NPRS scale: 5/10 Pain location: upper traps and neck Pain description: tight, HA Aggravating factors: stress Relieving factors: rest  PRECAUTIONS: None  RED FLAGS: None   WEIGHT BEARING RESTRICTIONS: No  FALLS: Has patient fallen in last 6 months? 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  DIAGNOSTIC FINDINGS: none  COGNITION: Overall cognitive status: Within  functional limits for tasks assessed   SENSATION: WFL  PALPATION:  Very tight and tender in the upper traps  POSTURE:  rounded shoulders and forward head  Cervical ROM:  all motions increased dizziness a little  Active A/PROM (deg) eval  Flexion WNL  Extension Decreased 25%  Right lateral flexion WNL  Left lateral flexion  WNL  Right rotation WNL  Left rotation WNL  (Blank rows = not tested)  STRENGTH: UE strength 4-/5  VESTIBULAR ASSESSMENT:  DHI:  58%  SYMPTOM BEHAVIOR:   Non-Vestibular symptoms: neck pain and headaches  Type of dizziness: Lightheadedness/Faint  Frequency: everyday  Duration: minutes to hours  Aggravating factors: Worse in the morning and bending forward, stress, getting up in the morning  Relieving factors: closing eyes and slow movements  Progression of symptoms: unchanged  OCULOMOTOR EXAM:  Ocular Alignment: normal  Ocular ROM: No Limitations  Spontaneous Nystagmus: absent  Gaze-Induced Nystagmus: absent  Smooth Pursuits: saccades mild  Saccades: dysmetria  Convergence/Divergence:  cm   VESTIBULAR - OCULAR REFLEX:   Slow VOR: Positive Bilaterally  VOR Cancellation: Unable to Maintain Gaze    POSITIONAL TESTING: Right Dix-Hallpike: no nystagmus Left Dix-Hallpike: no nystagmus  MOTION SENSITIVITY:  Reports that she would rate her dizziness a 2-3/10 most times but does increase with bending forward, quick movements and turns   OTHOSTATICS: not done  VESTIBULAR TREATMENT:                                                                                                   DATE:  12/19/23 NuStep L5x30mins Walking with head turns  Walking playing catch Standing on airex head turns  Standing on airex eyes closed Standing on airex volleyball hits HEP and education on habituation    12/05/23 NuStep L4 x 4 minutes Balance screen- patient demonstrated static stand, narrow BOS, then eyes closed on stable ground, then repeated with feet on airex pad. Stable x 30 sec in each position. VOR seated horizontal x 20 sec, dizziness to 5, vertical x 10 sec, dizziness to 5. Standing on airex, catch with ball tossed to each side to require head and body turning. Approximately 20 reps, dizziness to 5. Seated VOR horizontal and vert, increased dizziness 2 levels Ambulation in straight  line with head turns horizontal and vertical, increased dizziness, more so with vertical head motion. Ambulation reaching down and to the side, alternating each side. Dizziness increased to Level 5, repeated x 3, allowing dizziness to return to baseline after each round.  11/28/23 Goals reviewed for UPOC Dizziness remains, up to 10/10 first thing in the morning. It sometimes returns toward the end of the day and is often accompanied by H/A. Driving does not seem to aggravate the dizziness or headache. Patient reports her dizziness is at 3-4 today, decline vestibular re-assessment out of fear of getting sick.  10/31/23 NuStep L5 x 6 minutes. Orthostatic BP testing Supine 124/82 77 BPM Reports some dizziness after moving sup to sit Sitting immediate 119/79 81 BPM, 3 minutes 97/77 85 BPM Standing immediate 102/80 86 BPM Education regarding BP and safety  Education regarding staying hydrated and how to determine if she is well hydrated. VOR-Seated, horizontal, 3 x 10 head turns, unable to complete 10 due to dizziness rising to 4/10 from 2/10 VOR x2- Seated horizontal, 2 x 10 head turns, unable to complete.  10/24/23 Dizziness training including standing on Air ex pad, standing with narrow BOS on pad, standing on solid ground with eyes closed and then with narrow BOS and eyes closed. Able to get to 30 sec for each without significant increase in her dizziness. Ambulation x 20' with head turns, x 2. Dizziness rose to 4, but dropped back to 2. Repeat with head nods, dizziness increased to 5, but dropped again.  10/10/23 NuStep L5x60mins  VOR x1 and x2 standing 20 reps each Walking on beam  BPPV canal testing- negative with R Gilberto Better but positive with horizontal canal   09/29/23 NuStep L4 x8mins  Calf stretch on slant 15s x3  Calf raises 2x10 VOR x1 and x2 seated Standing on airex EO, EC, head turns   09/13/23 VOR x1  VOR x2 Functional gait assessment  Education about vestibular  system and how VOR exercises will be beneficial  Standing on airex EO, EC, head turns   08/29/23 Dix halpike negative bilaterally, she felt supported and did not get dizzy BP sitting 134/110 BP standing 130/100 BP Supine  130/100 Reviewed VOR, performed 2x Education on the VOR, vs BPVVV, vs orthostatics, she has a lot of questions that we went over   PATIENT EDUCATION: Education details: HEP and POC Person educated: Patient Education method: Programmer, multimedia, Demonstration, Verbal cues, and Handouts Education comprehension: verbalized understanding  HOME EXERCISE PROGRAM: VOR exercises   GOALS: Goals reviewed with patient? Yes  SHORT TERM GOALS: Target date: 08/27/23  Independent with initial HEP Goal status: 10/24/23- Patient reports she inconsistently performs due to the discomfort, ongoing  LONG TERM GOALS: Target date: 11/14/23  Independent with advanced HEP Goal status: 11/28/23- She reports that she has not performed the exercises much since last visit. Reviewed and updated as appropriate, ongoing 12/19/23  2.  Assess BPPV again and orthostatics Goal status: MET 08/29/23  3.  Report that she has 50% less dizziness with getting up in the AM Goal status: 11/28/23-Up to 10/10, 4-5/7 mornings. Lasts about 5 minutes, improved if she gets up slow. Ongoing. Sometimes it is still a 10 in the mornings 12/19/23  4.  No dizziness with looking up or down Goal status: 10/24/23-up to 5/10 after walking 20' with head nods. Ongoing 12/19/23   ASSESSMENT:  CLINICAL IMPRESSION: Patient is a 23 y.o. female who was seen today for physical therapy treatment for dizziness. She has not made much improvement from her first visit in 08/14/24. Pt has also attended 10 visits in the last 5 months. She is also non compliant with HEP and recommendations given. Performed multiple tests for dizziness and then performed multiple activities to trigger the dizziness to build accomodation. She was education on how  this will help retrain vestibular system.   OBJECTIVE IMPAIRMENTS: Abnormal gait, decreased activity tolerance, decreased balance, decreased coordination, decreased endurance, decreased mobility, difficulty walking, decreased ROM, decreased strength, dizziness, increased muscle spasms, impaired flexibility, impaired vision/preception, improper body mechanics, postural dysfunction, and pain.   REHAB POTENTIAL: Good  CLINICAL DECISION MAKING: Stable/uncomplicated  EVALUATION COMPLEXITY: Low   PLAN:  PT FREQUENCY: 1-2x/week  PT DURATION: 12 weeks  PLANNED INTERVENTIONS: Therapeutic exercises, Therapeutic activity, Neuromuscular re-education, Balance training, Gait training, Patient/Family education, Self Care, Joint mobilization, Vestibular  training, Canalith repositioning, Visual/preceptual remediation/compensation, Dry Needling, Electrical stimulation, Spinal mobilization, Cryotherapy, Moist heat, Taping, Traction, Ultrasound, and Manual therapy  PLAN FOR NEXT SESSION: d/c  PHYSICAL THERAPY DISCHARGE SUMMARY  Visits from Start of Care: 10  Patient agrees to discharge. Patient goals were partially met. Patient is being discharged due to lack of progress.   Cassie Freer, PT, DPT 12/19/2023, 5:49 PM

## 2023-12-22 DIAGNOSIS — F4311 Post-traumatic stress disorder, acute: Secondary | ICD-10-CM | POA: Diagnosis not present

## 2023-12-23 ENCOUNTER — Telehealth: Payer: Self-pay | Admitting: Family Medicine

## 2023-12-23 DIAGNOSIS — H547 Unspecified visual loss: Secondary | ICD-10-CM

## 2023-12-23 NOTE — Telephone Encounter (Signed)
 Patient called stating she is needing a referral placed. She called to schedule regular eye exam and to get glasses and [facility] told her she had to have a referral.   Please Advise.   Thanks!

## 2023-12-26 NOTE — Addendum Note (Signed)
 Addended by: Loye Rumble on: 12/26/2023 07:37 AM   Modules accepted: Orders

## 2023-12-26 NOTE — Telephone Encounter (Signed)
 Referral placed to see optometrist at [facility] at office and patient request.

## 2024-01-02 ENCOUNTER — Ambulatory Visit: Payer: Medicaid Other | Admitting: Family Medicine

## 2024-01-18 DIAGNOSIS — G4733 Obstructive sleep apnea (adult) (pediatric): Secondary | ICD-10-CM | POA: Diagnosis not present

## 2024-01-23 ENCOUNTER — Ambulatory Visit (INDEPENDENT_AMBULATORY_CARE_PROVIDER_SITE_OTHER): Payer: Medicaid Other | Admitting: Family Medicine

## 2024-01-23 VITALS — BP 128/75 | Ht 66.0 in | Wt 309.0 lb

## 2024-01-23 DIAGNOSIS — M7661 Achilles tendinitis, right leg: Secondary | ICD-10-CM | POA: Diagnosis not present

## 2024-01-23 DIAGNOSIS — M778 Other enthesopathies, not elsewhere classified: Secondary | ICD-10-CM | POA: Diagnosis not present

## 2024-01-23 NOTE — Patient Instructions (Addendum)
 Arch supports are very important - use sports insoles with scaphoid pads. Switch these into the shoes you're wearing that day. I still would avoid the flat shoes you showed me. Do home exercises you were shown at last visit. Icing 15 minutes at a time as needed. If not improving over the next couple weeks call us and I'd recommend physical therapy as the next step. Follow up in 5-6 weeks otherwise.

## 2024-01-24 ENCOUNTER — Encounter: Payer: Self-pay | Admitting: Family Medicine

## 2024-01-24 NOTE — Progress Notes (Signed)
 PCP: Lockie Mola, MD  Subjective:   HPI: Lauren Obrien is a 23 y.o. female here for right ankle pain.  1/20: Lauren Obrien reports she started to get pain in her right ankle about 1.5 months ago. No acute injury or trauma. She states the pain was initially on the posterior aspect of the ankle and heel but has been bothering her laterally and anteriorly along with the dorsal foot. At times will have difficulty putting weight on her right leg due to the pain. Sometimes has some swelling but no bruising. No issues with the left ankle. Has tried ibuprofen with some benefit.  3/10: Unfortunately she feels about the same as last visit. Pain posterior and lateral ankles. She bought some inserts at [employer] but hasn't noticed much benefit with these. Doing home exercises.  Past Medical History:  Diagnosis Date   Acne vulgaris    Cough 11/17/2021   COVID-19 12/11/2020   Dizziness 08/23/2019   Folliculitis 08/29/2019   GERD (gastroesophageal reflux disease)    Helicobacter pylori gastritis    Hidradenitis suppurativa    Neuropathy    BUE   Pharyngitis 11/13/2019   Pre-diabetes    Routine screening for STI (sexually transmitted infection) 12/26/2020   Seasonal allergies    Sleep apnea    Sleep deprivation 09/28/2018   Snoring 01/22/2020   Sore throat 11/13/2019   Strep pharyngitis 05/29/2022   Viral URI 02/01/2020    Current Outpatient Medications on File Prior to Visit  Medication Sig Dispense Refill   albuterol (VENTOLIN HFA) 108 (90 Base) MCG/ACT inhaler Inhale 2 puffs into the lungs every 4 (four) hours as needed for wheezing or shortness of breath. 1 each 0   benzonatate (TESSALON) 100 MG capsule Take 1 capsule (100 mg total) by mouth every 8 (eight) hours. 21 capsule 0   cetirizine (ZYRTEC) 10 MG tablet Take 1 tablet (10 mg total) by mouth daily as needed for allergies. 1 tablet 0   fluconazole (DIFLUCAN) 150 MG tablet Take by mouth.     fluticasone (FLONASE) 50 MCG/ACT nasal  spray PLACE 2 SPRAYS INTO BOTH NOSTRILS DAILY AS NEEDED 16 g 0   Multiple Vitamin (MULTIVITAMIN) capsule Take 1 capsule by mouth daily.     promethazine-dextromethorphan (PROMETHAZINE-DM) 6.25-15 MG/5ML syrup Take 5 mLs by mouth every 6 (six) hours as needed for cough. 200 mL 0   No current facility-administered medications on file prior to visit.    Past Surgical History:  Procedure Laterality Date   NO PAST SURGERIES     TOOTH EXTRACTION Bilateral 08/25/2021   Procedure: DENTAL RESTORATION/EXTRACTIONS;  Surgeon: Ocie Doyne, DMD;  Location: [facility];  Service: Oral Surgery;  Laterality: Bilateral;    No Known Allergies  BP 128/75   Ht 5\' 6"  (1.676 m)   Wt (!) 309 lb (140.2 kg)   BMI 49.87 kg/m       No data to display              No data to display              Objective:  Physical Exam:  Gen: NAD, comfortable in exam room  Right ankle: Pes planus.  No gross deformity, swelling, ecchymoses Full range of motion Mild tenderness to palpation achilles at insertion, less over peroneal tendons Negative ant drawer and negative talar tilt.   NV intact distally.   Assessment & Plan:  1.  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  Unfortunately not  improved compared to last visit.  Sports insoles with scaphoid pads provided today.  Home exercises, icing.  Aleve or ibuprofen.  Follow up in 5-6 weeks.  Consider physical therapy.

## 2024-02-07 DIAGNOSIS — F4311 Post-traumatic stress disorder, acute: Secondary | ICD-10-CM | POA: Diagnosis not present

## 2024-02-14 DIAGNOSIS — F4311 Post-traumatic stress disorder, acute: Secondary | ICD-10-CM | POA: Diagnosis not present

## 2024-02-21 DIAGNOSIS — F4311 Post-traumatic stress disorder, acute: Secondary | ICD-10-CM | POA: Diagnosis not present

## 2024-02-22 ENCOUNTER — Other Ambulatory Visit: Payer: Self-pay

## 2024-02-22 DIAGNOSIS — J069 Acute upper respiratory infection, unspecified: Secondary | ICD-10-CM

## 2024-02-22 MED ORDER — CETIRIZINE HCL 10 MG PO TABS: 10.0000 mg | ORAL_TABLET | Freq: Every day | ORAL | 3 refills | Status: DC | PRN

## 2024-02-22 MED ORDER — FLUTICASONE PROPIONATE 50 MCG/ACT NA SUSP: NASAL | 3 refills | Status: DC

## 2024-02-24 ENCOUNTER — Ambulatory Visit (INDEPENDENT_AMBULATORY_CARE_PROVIDER_SITE_OTHER): Admitting: Student

## 2024-02-24 VITALS — BP 122/76 | Temp 97.9°F | Wt 311.4 lb

## 2024-02-24 DIAGNOSIS — R519 Headache, unspecified: Secondary | ICD-10-CM

## 2024-02-24 NOTE — Assessment & Plan Note (Addendum)
 Headaches x1 month with associated neck pain and intermittent pulsatile tinnitus Diff dx includes tension headaches, migraine headaches (which pt has a history of), cervicogenic headaches Also must consider IIH in patient with above associated findings and elevated weight 311 lbs Ordered brain MRI w/ and w/o contrast to evaluate for possible IIH, or structural abnormality Referred to neurology for f/u; last appointment many years ago for migraines Reassured by normal neurological exam today and no focal deficits Encouraged appropriate sleep hygiene, treatment for her OSA (which is most certainly contributory), hydration and eating nutritious foods Discussed OTC analgesics Return precautions discussed

## 2024-02-24 NOTE — Patient Instructions (Signed)
 It was great seeing you today.  I ordered a  brain MRI, you will be called to schedule I sent a referral to neurology as well  If you have any questions or concerns, please feel free to call the clinic.   Have a wonderful day,  Dr. Darral Dash [facility] [phone]

## 2024-02-24 NOTE — Progress Notes (Signed)
    SUBJECTIVE:   CHIEF COMPLAINT / HPI:   Headaches She says location is generalized No nausea, vomtiing No vision changes- no blurred vision, floaters, flashes of light Used to get migraines, but this has been an every day thing for the past 1 month Had some congestion and bodyaches this week Denies any photophobia or phonophobia Relieving factors: Ibuprofen, Tylenol with mild relief, laying down Has neck pain  Has OSA, going to see her sleep  physician soon  Last MRI brain w/ and w/o contrast was in 2020 and showed multiple T2/flair hyperintense foci in the subcortical white matter fitting with sequelae of migraine, chronic microvascular ischemic change, or prior infection or trauma.  PERTINENT  PMH / PSH:  OSA Obesity Chronic headaches  OBJECTIVE:   BP 122/76   Temp 97.9 F (36.6 C)   Wt (!) 311 lb 6.4 oz (141.3 kg)   SpO2 98%   BMI 50.26 kg/m   General: Pleasant, no distress, obese HEENT: Pupils PERRLA Neuro: Speech clear and fluent. No focal neurological findings, CN 2-12 intact. Extremities: Moves all equally, normal gait  ASSESSMENT/PLAN:   Generalized headaches Headaches x1 month with associated neck pain and intermittent pulsatile tinnitus Diff dx includes tension headaches, migraine headaches (which pt has a history of), cervicogenic headaches Also must consider IIH in patient with above associated findings and elevated weight 311 lbs Ordered brain MRI w/ and w/o contrast to evaluate for possible IIH, or structural abnormality Referred to neurology for f/u; last appointment many years ago for migraines Reassured by normal neurological exam today and no focal deficits Encouraged appropriate sleep hygiene, treatment for her OSA (which is most certainly contributory), hydration and eating nutritious foods Discussed OTC analgesics Return precautions discussed     Darral Dash, DO [facility]

## 2024-02-28 DIAGNOSIS — F4311 Post-traumatic stress disorder, acute: Secondary | ICD-10-CM | POA: Diagnosis not present

## 2024-03-02 ENCOUNTER — Ambulatory Visit (HOSPITAL_COMMUNITY)
Admission: RE | Admit: 2024-03-02 | Discharge: 2024-03-02 | Disposition: A | Discharge status: Home / Self Care | Source: Ambulatory Visit | Attending: Family Medicine | Admitting: Family Medicine

## 2024-03-02 DIAGNOSIS — R519 Headache, unspecified: Secondary | ICD-10-CM

## 2024-03-02 MED ORDER — GADOBUTROL 1 MMOL/ML IV SOLN
10.0000 mL | Freq: Once | INTRAVENOUS | Status: AC | PRN
  Administered 2024-03-02: 10 mL via INTRAVENOUS

## 2024-03-05 ENCOUNTER — Ambulatory Visit (INDEPENDENT_AMBULATORY_CARE_PROVIDER_SITE_OTHER): Admitting: Family Medicine

## 2024-03-05 ENCOUNTER — Encounter: Payer: Self-pay | Admitting: Family Medicine

## 2024-03-05 VITALS — BP 110/76 | Ht 66.0 in | Wt 311.0 lb

## 2024-03-05 DIAGNOSIS — M7661 Achilles tendinitis, right leg: Secondary | ICD-10-CM | POA: Diagnosis not present

## 2024-03-05 NOTE — Patient Instructions (Signed)
 If you want to do custom orthotics, make an appointment and tell Prentice Brochure this is what the appointment is for - expect it to be 45 minutes. Continue with the sports insoles with scaphoid pads - these need to be replaced about every 6 months.

## 2024-03-05 NOTE — Progress Notes (Unsigned)
 PCP: Lauren Cuba, MD  Subjective:   HPI: Patient is a 23 y.o. female here for follow up of right heel pain. Pain has resolved.  Received green sports insoles with scaphoid pads last visit which has helped.  Never did home exercises/PT.   Prolonged standing and walking makes pain worse. Reports occasional numbness and tingling in medial foot when not wearing orthotics.  Has not taken over the counter medications for pain.   Past Medical History:  Diagnosis Date   Acne vulgaris    Cough 11/17/2021   COVID-19 12/11/2020   Dizziness 08/23/2019   Folliculitis 08/29/2019   GERD (gastroesophageal reflux disease)    Helicobacter pylori gastritis    Hidradenitis suppurativa    Neuropathy    BUE   Pharyngitis 11/13/2019   Pre-diabetes    Routine screening for STI (sexually transmitted infection) 12/26/2020   Seasonal allergies    Sleep apnea    Sleep deprivation 09/28/2018   Snoring 01/22/2020   Sore throat 11/13/2019   Strep pharyngitis 05/29/2022   Viral URI 02/01/2020    Current Outpatient Medications on File Prior to Visit  Medication Sig Dispense Refill   albuterol  (VENTOLIN  HFA) 108 (90 Base) MCG/ACT inhaler Inhale 2 puffs into the lungs every 4 (four) hours as needed for wheezing or shortness of breath. 1 each 0   benzonatate  (TESSALON ) 100 MG capsule Take 1 capsule (100 mg total) by mouth every 8 (eight) hours. 21 capsule 0   cetirizine  (ZYRTEC ) 10 MG tablet Take 1 tablet (10 mg total) by mouth daily as needed for allergies. 30 tablet 3   fluconazole  (DIFLUCAN ) 150 MG tablet Take by mouth.     fluticasone  (FLONASE ) 50 MCG/ACT nasal spray PLACE 2 SPRAYS INTO BOTH NOSTRILS DAILY AS NEEDED 16 g 3   Multiple Vitamin (MULTIVITAMIN) capsule Take 1 capsule by mouth daily.     promethazine -dextromethorphan (PROMETHAZINE -DM) 6.25-15 MG/5ML syrup Take 5 mLs by mouth every 6 (six) hours as needed for cough. 200 mL 0   No current facility-administered medications on file prior to  visit.    Past Surgical History:  Procedure Laterality Date   NO PAST SURGERIES     TOOTH EXTRACTION Bilateral 08/25/2021   Procedure: DENTAL RESTORATION/EXTRACTIONS;  Surgeon: Ascencion Lava, DMD;  Location: [facility];  Service: Oral Surgery;  Laterality: Bilateral;    No Known Allergies  BP 110/76 (BP Location: Left Arm, Patient Position: Sitting)   Ht 5\' 6"  (1.676 m)   Wt (!) 311 lb (141.1 kg)   BMI 50.20 kg/m       No data to display              No data to display              Objective:  Physical Exam:  Gen: NAD, comfortable in exam room  MSK:  Right ankle: No gross deformity, swelling, ecchymoses   No TTP  Full ROM  Strength 5/5 on plantarflexion and dorsiflexion   Negative calcaneal squeeze test  Negative Thompson test  Neuro: sensation and motor function intact    Assessment & Plan:  1. Right ankle pain -- resolved - Can consider more durable and customized insoles at a later visit   - Continue wearing current insoles with scaphoid pads for arch support  - Avoid flat shoes   Tkeya Stencil B. Seferino Dade, MS4

## 2024-03-08 ENCOUNTER — Other Ambulatory Visit (HOSPITAL_COMMUNITY)

## 2024-03-16 ENCOUNTER — Ambulatory Visit (INDEPENDENT_AMBULATORY_CARE_PROVIDER_SITE_OTHER): Admitting: Family Medicine

## 2024-03-16 ENCOUNTER — Encounter: Payer: Self-pay | Admitting: Family Medicine

## 2024-03-16 VITALS — BP 134/82 | HR 73 | Ht 66.0 in | Wt 307.6 lb

## 2024-03-16 DIAGNOSIS — R42 Dizziness and giddiness: Secondary | ICD-10-CM | POA: Diagnosis not present

## 2024-03-16 DIAGNOSIS — R519 Headache, unspecified: Secondary | ICD-10-CM | POA: Diagnosis not present

## 2024-03-16 DIAGNOSIS — K130 Diseases of lips: Secondary | ICD-10-CM | POA: Diagnosis not present

## 2024-03-16 DIAGNOSIS — R7303 Prediabetes: Secondary | ICD-10-CM | POA: Diagnosis not present

## 2024-03-16 LAB — POCT GLYCOSYLATED HEMOGLOBIN (HGB A1C): Hemoglobin A1C: 5.4 % (ref 4.0–5.6)

## 2024-03-16 NOTE — Progress Notes (Signed)
    SUBJECTIVE:   CHIEF COMPLAINT / HPI:    Dry, burning lips -started about 1 week ago -tried vaseline, this felt uncomfortable -was using the same chapstick for a long time, this did not help -had some itching after symptoms started, no itching now -no new medicines or skin care/makeup products -no new foods -no new sexual partners, not sexually active for a few years -Has not kissed anyone on the mouth recently -did get a sunburn on her chest a few weeks ago after walking around outside (patient does this often), was just red and did not peel - No sensation of throat closing, hives, other allergic symptoms  Dizziness -has been a problem for a while per pt -no falls -hydrating well -gets dizzy while driving -has eye appt in June   Headaches -MRI read not back yet, patient wondering when the results will be in - Has a follow-up appointment with neurology in a few weeks  PERTINENT  PMH / PSH: OSA, chronic headache, dizziness, prediabetes  OBJECTIVE:   BP 134/82   Pulse 73   Ht 5\' 6"  (1.676 m)   Wt (!) 307 lb 9.6 oz (139.5 kg)   LMP 02/17/2024   SpO2 100%   BMI 49.65 kg/m   General: Sitting in exam room, awake and conversant, no acute distress HEENT: Moist mucous membranes, possible small sores on central inner lip both upper and lower (nontender to palpation) Skin: no erythema or lesions around mouth, no peeling skin on lips or angular cheilitis  Neuro: No focal neuro deficits Psych: Appropriate mood and affect  ASSESSMENT/PLAN:   Assessment & Plan Dry lips Symptoms sound most consistent with sunburn given patient walking outside and she reports of peeling/itching after initial burning feeling.  No concern for STI as patient has not been sexually active or mouth kissing recently.  No concern for allergic reaction given length of symptoms and no other allergic symptoms. --Advised continued use of Vaseline or Aquaphor --Advised use of lip moisturizer with SPF when  outside -- Patient will continue to monitor, gave return precautions Prediabetes A1c improved to 5.4 today Dizziness Patient states this is a chronic problem.  Denies falls, reports good hydration.  Does have dizziness when driving but this seems to be more related to looking at her phone.  Likely that patient has vision issues that are contributing to this --Advised caution when having dizziness, avoid driving --Advised following up with optometrist, patient has appointment at the end of August --Continue to monitor --Advised 1 month follow-up, patient prefers to schedule follow-up appointment herself Generalized headaches Had MRI a few weeks ago, read is not back.  No new concerns today. --Advised that it may take several more weeks to get read back but that someone would contact her whenever the report is back -- Has appointment with neurologist in a few weeks     Lauren Austria, MD [facility]

## 2024-03-16 NOTE — Assessment & Plan Note (Signed)
 Patient states this is a chronic problem.  Denies falls, reports good hydration.  Does have dizziness when driving but this seems to be more related to looking at her phone.  Likely that patient has vision issues that are contributing to this --Advised caution when having dizziness, avoid driving --Advised following up with optometrist, patient has appointment at the end of August --Continue to monitor --Advised 1 month follow-up, patient prefers to schedule follow-up appointment herself

## 2024-03-16 NOTE — Assessment & Plan Note (Signed)
 Had MRI a few weeks ago, read is not back.  No new concerns today. --Advised that it may take several more weeks to get read back but that someone would contact her whenever the report is back -- Has appointment with neurologist in a few weeks

## 2024-03-16 NOTE — Assessment & Plan Note (Signed)
 A1c improved to 5.4 today

## 2024-03-16 NOTE — Patient Instructions (Addendum)
 Thank you for coming in today! Here is a summary of what we discussed:  -I think your dry lips are probably from a sunburn. I recommend continuing to use lip moisturizers like Aquaphor or Vaseline until it feels better. You can also use a lip moisturizer that has sunscreen in it. Let us  know if it is not improving or getting worse.  -Please follow up with neurology about your migraines  -Please follow up with your optometrist as scheduled  -Let us  know if your dizziness gets worse. Please do not drive if you feel dizzy and be careful looking at your phone while driving.  -We will let you know when your MRI results are back. This may take several more weeks  Please call the clinic at [phone] if your symptoms worsen or you have any concerns.  Best, Dr Sampson Critchley

## 2024-03-21 DIAGNOSIS — F4311 Post-traumatic stress disorder, acute: Secondary | ICD-10-CM | POA: Diagnosis not present

## 2024-03-28 ENCOUNTER — Encounter: Payer: Self-pay | Admitting: Neurology

## 2024-03-28 ENCOUNTER — Telehealth: Payer: Self-pay | Admitting: Family Medicine

## 2024-03-28 ENCOUNTER — Telehealth (INDEPENDENT_AMBULATORY_CARE_PROVIDER_SITE_OTHER): Admitting: Neurology

## 2024-03-28 DIAGNOSIS — E66813 Obesity, class 3: Secondary | ICD-10-CM | POA: Diagnosis not present

## 2024-03-28 DIAGNOSIS — G4719 Other hypersomnia: Secondary | ICD-10-CM

## 2024-03-28 DIAGNOSIS — G4733 Obstructive sleep apnea (adult) (pediatric): Secondary | ICD-10-CM | POA: Diagnosis not present

## 2024-03-28 DIAGNOSIS — F4311 Post-traumatic stress disorder, acute: Secondary | ICD-10-CM | POA: Diagnosis not present

## 2024-03-28 DIAGNOSIS — Z6841 Body Mass Index (BMI) 40.0 and over, adult: Secondary | ICD-10-CM

## 2024-03-28 NOTE — Telephone Encounter (Signed)
 Pt called to check if there is a earlier time for today? Informed patient do not have anything available. Patient ask if can be 15 late to appointment. Informed patient if 15 minutes late would have to reschedule appointment. Verbalized understand

## 2024-03-28 NOTE — Patient Instructions (Signed)
 Assessment and Plan:   OSA - on CPAP.    Address weight loss goals with your PCP.   The use of GLP 1 is a safe way to achieve loss and reverse a prediabetic condition.    I will order a new set of supplies. DME order is sent.    Sleep time should start before midnight, and duration should be a minimum of 6 -7 hours, to feel refreshed.    You are young at 62 and may need more sleep.      Discussed sleep physiology and psychology of daytime daylight exposure.      Follow Up Instructions:  RV  in 12 months for CPAP compliance , this can be scheduled with NP.  Needs new supplies at that time again.

## 2024-03-28 NOTE — Progress Notes (Signed)
 Virtual Visit via Video Note  I connected with Lauren Obrien on 03/28/24 at  4:00 PM EDT by a video enabled telemedicine application and verified that I am speaking with the correct person using two identifiers.  Location: Patient: at home  Provider: at her office    I discussed the limitations of evaluation and management by telemedicine and the availability of in person appointments. The patient expressed understanding and agreed to proceed.  History of Present Illness:   HISTORY OF PRESENT ILLNESS:  Lauren Obrien is a 23 y.o. female CPAP user and OSA patient who is here for revisit 03/28/2024.   she had issues with supplies and was not always able to use CPAP and she felt much more fatigued.  She noted how much it helps her to stay alert.   She has reached a reduction in AHI to 0.1/h.  BMI : 50 , she is not likely to see a l decrease in AHI will require a weight loss of 20% to 33%.   Chief concern according to patient :  how long will I need to use the CPAP ?   We discussed the main risk factor is her weight.       Review of Systems: Out of a complete 14 system review, the patient complains of only the following symptoms, and all other reviewed systems are negative.:    How likely are you to doze in the following situations: 0 = not likely, 1 = slight chance, 2 = moderate chance, 3 = high chance  Sitting and Reading? Watching Television? Sitting inactive in a public place (theater or meeting)? Lying down in the afternoon when circumstances permit? Sitting and talking to someone? Sitting quietly after lunch without alcohol? In a car, while stopped for a few minutes in traffic? As a passenger in a car for an hour without a break?  Total = score 8/ 24  FSS at 46/ 63 points.   Easily asleep when not stimulated .    Observations/Objective: OSA : the main risk factor for her sleep apnea is her weight. Category 3 obesity at BMI of 50 /  .    Assessment and  Plan:   OSA - on CPAP.   Address weight loss goals with your PCP.   The use of GLP 1 is a safe way to achieve loss and reverse a prediabetic condition.   I will order a new set of supplies. DME order is sent.   Sleep time should start before midnight, and duration should be a minimum of 6 -7 hours, to feel refreshed.    You are young at 65 and may need more sleep.    Discussed sleep physiology and psychology of daytime daylight exposure.    Follow Up Instructions:  RV  in 12 months for CPAP compliance , this can be scheduled with NP.  Needs new supplies at that time again.     I discussed the assessment and treatment plan with the patient. The patient was provided an opportunity to ask questions and all were answered. The patient agreed with the plan and demonstrated an understanding of the instructions.   The patient was advised to call back or seek an in-person evaluation if the symptoms worsen or if the condition fails to improve as anticipated.  I provided 30 minutes of non-face-to-face time during this encounter.   Neomia Banner, MD

## 2024-03-29 ENCOUNTER — Ambulatory Visit: Payer: Self-pay | Admitting: Student

## 2024-03-30 DIAGNOSIS — F4311 Post-traumatic stress disorder, acute: Secondary | ICD-10-CM | POA: Diagnosis not present

## 2024-04-03 ENCOUNTER — Ambulatory Visit: Admitting: Neurology

## 2024-04-04 DIAGNOSIS — F4311 Post-traumatic stress disorder, acute: Secondary | ICD-10-CM | POA: Diagnosis not present

## 2024-04-12 ENCOUNTER — Encounter: Payer: Self-pay | Admitting: Diagnostic Neuroimaging

## 2024-04-12 ENCOUNTER — Ambulatory Visit: Admitting: Diagnostic Neuroimaging

## 2024-04-12 VITALS — BP 108/69 | HR 74 | Ht 66.0 in | Wt 305.0 lb

## 2024-04-12 DIAGNOSIS — F4311 Post-traumatic stress disorder, acute: Secondary | ICD-10-CM | POA: Diagnosis not present

## 2024-04-12 DIAGNOSIS — G43109 Migraine with aura, not intractable, without status migrainosus: Secondary | ICD-10-CM

## 2024-04-12 MED ORDER — RIZATRIPTAN BENZOATE 10 MG PO TBDP: 10.0000 mg | ORAL_TABLET | ORAL | 11 refills | Status: DC | PRN

## 2024-04-12 MED ORDER — TOPIRAMATE 50 MG PO TABS: 50.0000 mg | ORAL_TABLET | Freq: Two times a day (BID) | ORAL | 12 refills | Status: DC

## 2024-04-12 NOTE — Progress Notes (Signed)
 [facility]  PATIENT: Lauren Obrien DOB: January 09, 2001  REFERRING CLINICIAN: Charise Companion, MD HISTORY FROM: patient REASON FOR VISIT: new consult   HISTORICAL  CHIEF COMPLAINT:  Chief Complaint  Patient presents with   Headache    RM 6 alone  Pt is well, reports she has been having headaches for about 8 yrs. She has a headache about 2-4 days a week. Associated dizziness and sensitivity to light/sound     HISTORY OF PRESENT ILLNESS:   23 year old female here for evaluation of headaches.  Since 2016 patient has had onset of bitemporal throbbing severe headaches lasting hours or days at a time, associated with dizziness, sensitive to light and sound and nausea.  Headaches tend occur every Wednesday.  No other specific triggering or aggravating factors noted.  Averaging about 2 days of headache per week.  Has tried some over-the-counter medications but has not tried any prescription medicines for this yet.  Patient continues to have chronic fluctuating intermittent numbness and pain of the bilateral upper extremities, arms, wrists, hands.  She has had 2 EMG nerve conduction studies which have been normal.  MRI of the brain x 2 has been normal.  MRI of the cervical spine in the past has been normal.    REVIEW OF SYSTEMS: Full 14 system review of systems performed and negative with exception of: as per HPI.  ALLERGIES: No Known Allergies  HOME MEDICATIONS: Outpatient Medications Prior to Visit  Medication Sig Dispense Refill   benzonatate  (TESSALON ) 100 MG capsule Take 1 capsule (100 mg total) by mouth every 8 (eight) hours. 21 capsule 0   cetirizine  (ZYRTEC ) 10 MG tablet Take 1 tablet (10 mg total) by mouth daily as needed for allergies. 30 tablet 3   fluconazole  (DIFLUCAN ) 150 MG tablet Take by mouth.     fluticasone  (FLONASE ) 50 MCG/ACT nasal spray PLACE 2 SPRAYS INTO BOTH NOSTRILS DAILY AS NEEDED 16 g 3   Multiple Vitamin (MULTIVITAMIN) capsule Take 1  capsule by mouth daily.     promethazine -dextromethorphan (PROMETHAZINE -DM) 6.25-15 MG/5ML syrup Take 5 mLs by mouth every 6 (six) hours as needed for cough. 200 mL 0   albuterol  (VENTOLIN  HFA) 108 (90 Base) MCG/ACT inhaler Inhale 2 puffs into the lungs every 4 (four) hours as needed for wheezing or shortness of breath. (Patient not taking: Reported on 04/12/2024) 1 each 0   No facility-administered medications prior to visit.    PAST MEDICAL HISTORY: Past Medical History:  Diagnosis Date   Acne vulgaris    Cough 11/17/2021   COVID-19 12/11/2020   Dizziness 08/23/2019   Folliculitis 08/29/2019   GERD (gastroesophageal reflux disease)    Helicobacter pylori gastritis    Hidradenitis suppurativa    Neuropathy    BUE   Pharyngitis 11/13/2019   Pre-diabetes    Routine screening for STI (sexually transmitted infection) 12/26/2020   Seasonal allergies    Sleep apnea    Sleep deprivation 09/28/2018   Snoring 01/22/2020   Sore throat 11/13/2019   Strep pharyngitis 05/29/2022   Viral URI 02/01/2020    PAST SURGICAL HISTORY: Past Surgical History:  Procedure Laterality Date   NO PAST SURGERIES     TOOTH EXTRACTION Bilateral 08/25/2021   Procedure: DENTAL RESTORATION/EXTRACTIONS;  Surgeon: Ascencion Lava, DMD;  Location: [facility];  Service: Oral Surgery;  Laterality: Bilateral;    FAMILY HISTORY: Family History  Problem Relation Age of Onset   Diabetes Father    Hypertension Father  Asthma Maternal Grandmother    Diabetes Maternal Grandmother    Diabetes Maternal Grandfather    Diabetes Paternal Grandmother    Diabetes Paternal Grandfather    Colon cancer Neg Hx    Esophageal cancer Neg Hx    Pancreatic cancer Neg Hx    Stomach cancer Neg Hx    Liver disease Neg Hx     SOCIAL HISTORY: Social History   Socioeconomic History   Marital status: Single    Spouse name: Not on file   Number of children: 1   Years of education: 12   Highest education  level: Not on file  Occupational History    Comment: [employer] student  Tobacco Use   Smoking status: Never    Passive exposure: Never   Smokeless tobacco: Never  Vaping Use   Vaping status: Never Used  Substance and Sexual Activity   Alcohol use: No    Alcohol/week: 0.0 standard drinks of alcohol   Drug use: No   Sexual activity: Not Currently  Other Topics Concern   Not on file  Social History Narrative   Right handed     11th grade does not do well in school   Social Drivers of Health   Financial Resource Strain: Medium Risk (05/27/2023)   Overall Financial Resource Strain (CARDIA)    Difficulty of Paying Living Expenses: Somewhat hard  Food Insecurity: Not on file  Transportation Needs: Unknown (05/27/2023)   PRAPARE - Administrator, Civil Service (Medical): No    Lack of Transportation (Non-Medical): Not on file  Physical Activity: Not on file  Stress: Not on file  Social Connections: Not on file  Intimate Partner Violence: Not on file     PHYSICAL EXAM  GENERAL EXAM/CONSTITUTIONAL: Vitals:  Vitals:   04/12/24 1608  BP: 108/69  Pulse: 74  Weight: (!) 305 lb (138.3 kg)  Height: 5\' 6"  (1.676 m)   Body mass index is 49.23 kg/m. Wt Readings from Last 3 Encounters:  04/12/24 (!) 305 lb (138.3 kg)  03/16/24 (!) 307 lb 9.6 oz (139.5 kg)  03/05/24 (!) 311 lb (141.1 kg)   Patient is in no distress; well developed, nourished and groomed; neck is supple  CARDIOVASCULAR: Examination of carotid arteries is normal; no carotid bruits Regular rate and rhythm, no murmurs Examination of peripheral vascular system by observation and palpation is normal  EYES: Ophthalmoscopic exam of optic discs and posterior segments is normal; no papilledema or hemorrhages No results found.  MUSCULOSKELETAL: Gait, strength, tone, movements noted in Neurologic exam below  NEUROLOGIC: MENTAL STATUS:      No data to display         awake, alert, oriented to person,  place and time recent and remote memory intact normal attention and concentration language fluent, comprehension intact, naming intact fund of knowledge appropriate  CRANIAL NERVE:  2nd - no papilledema on fundoscopic exam 2nd, 3rd, 4th, 6th - pupils equal and reactive to light, visual fields full to confrontation, extraocular muscles intact, no nystagmus 5th - facial sensation symmetric 7th - facial strength symmetric 8th - hearing intact 9th - palate elevates symmetrically, uvula midline 11th - shoulder shrug symmetric 12th - tongue protrusion midline  MOTOR:  normal bulk and tone, full strength in the BUE, BLE  SENSORY:  normal and symmetric to light touch, temperature, vibration  COORDINATION:  finger-nose-finger, fine finger movements normal  REFLEXES:  deep tendon reflexes 1+ and symmetric  GAIT/STATION:  narrow based gait  DIAGNOSTIC DATA (LABS, IMAGING, TESTING) - I reviewed patient records, labs, notes, testing and imaging myself where available.  Lab Results  Component Value Date   WBC 8.6 09/09/2023   HGB 12.3 09/09/2023   HCT 38.5 09/09/2023   MCV 81.2 09/09/2023   PLT 257 09/09/2023      Component Value Date/Time   NA 142 10/11/2023 1253   K 4.3 10/11/2023 1253   CL 104 10/11/2023 1253   CO2 24 10/11/2023 1253   GLUCOSE 79 10/11/2023 1253   GLUCOSE 113 (H) 09/09/2023 1647   BUN 9 10/11/2023 1253   CREATININE 0.53 (L) 10/11/2023 1253   CALCIUM  9.0 10/11/2023 1253   PROT 7.1 09/09/2023 1647   PROT 6.7 03/25/2023 1215   ALBUMIN 3.3 (L) 09/09/2023 1647   ALBUMIN 4.0 03/25/2023 1215   AST 21 09/09/2023 1647   ALT 33 09/09/2023 1647   ALKPHOS 65 09/09/2023 1647   BILITOT 0.5 09/09/2023 1647   BILITOT 0.4 03/25/2023 1215   GFRNONAA >60 09/09/2023 1647   GFRAA 149 06/26/2019 1544   Lab Results  Component Value Date   CHOL 168 03/19/2022   HDL 41 03/19/2022   LDLCALC 100 (H) 03/19/2022   TRIG 151 (H) 03/19/2022   CHOLHDL 4.1 03/19/2022    Lab Results  Component Value Date   HGBA1C 5.4 03/16/2024   Lab Results  Component Value Date   VITAMINB12 588 03/19/2022   Lab Results  Component Value Date   TSH 1.400 03/19/2022   08/31/2019 MRI brain - MRI of the brain with and without contrast shows the following: 1.   There are multiple T2/flair hyperintense foci in the subcortical white matter.  None of these appear to be acute and there do not appear to be any new foci compared to the 2017 MRI.  This is a nonspecific finding and could represent sequela of migraine, chronic microvascular ischemic change or prior infection or trauma.  There are no foci in the periventricular white matter or the infratentorial white matter and demyelination is less likely. 2.   There was a normal enhancement pattern and no acute findings.  08/31/2019 MRI cervical spine - normal  10/04/2019 EMG nerve conduction study - Normal study (upper extremities)  02/25/22 EMG nerve conduction study -Normal study of the upper extremities  03/02/24 MRI brain - Similar mild white matter changes predominantly in the frontal lobes. Differential could include chronic migraines, vasculitis, or early chronic microvascular ischemic changes.    ASSESSMENT AND PLAN  23 y.o. year old female here with:   Dx:  1. Migraine with aura and without status migrainosus, not intractable      PLAN:  MIGRAINE WITH AURA  MIGRAINE PREVENTION (2 / week) LIFESTYLE CHANGES -Stop or avoid smoking -Decrease or avoid caffeine / alcohol -Eat and sleep on a regular schedule -Exercise several times per week - start topiramate 50mg  at bedtime; after 1-2 weeks increase to 50mg  twice a day; drink plenty of water   MIGRAINE RESCUE  - ibuprofen , tylenol  as needed - rizatriptan (Maxalt) 10mg  as needed for breakthrough headache; may repeat x 1 after 2 hours; max 2 tabs per day or 8 per month   NUMBNESS / PAIN IN ARMS / HANDS - unclear etiology; could be  musculoskeletal cause or related to body habitus; extensive testing in past normal (EMG/NCS x 2, MRI brain, MRI cervical spine have been unremarkable) - consider OT evaluation   Meds ordered this encounter  Medications   topiramate (TOPAMAX) 50 MG tablet  Sig: Take 1 tablet (50 mg total) by mouth 2 (two) times daily.    Dispense:  60 tablet    Refill:  12   rizatriptan (MAXALT-MLT) 10 MG disintegrating tablet    Sig: Take 1 tablet (10 mg total) by mouth as needed for migraine. May repeat in 2 hours if needed    Dispense:  9 tablet    Refill:  11   Return in about 4 months (around 08/13/2024) for MyChart visit (15 min).  I spent 60 minutes of face-to-face and non-face-to-face time with patient.  This included previsit chart review, lab review, study review, order entry, electronic health record documentation, patient education.     Omega Bible, MD 04/12/2024, 5:13 PM Certified in Neurology, Neurophysiology and Neuroimaging  [facility] [address], [address] [place], [place] [address] [phone]

## 2024-04-12 NOTE — Patient Instructions (Addendum)
 MIGRAINE WITH AURA  MIGRAINE PREVENTION (2 / week) LIFESTYLE CHANGES -Stop or avoid smoking -Decrease or avoid caffeine / alcohol -Eat and sleep on a regular schedule -Exercise several times per week - start topiramate 50mg  at bedtime; after 1-2 weeks increase to 50mg  twice a day; drink plenty of water   MIGRAINE RESCUE  - ibuprofen , tylenol  as needed - rizatriptan (Maxalt) 10mg  as needed for breakthrough headache; may repeat x 1 after 2 hours; max 2 tabs per day or 8 per month   NUMBNESS / PAIN IN ARMS / HANDS - extensive testing in past (EMG/NCS x 2, MRI brain, MRI cervical spine have been unremarkable) - consider OT evaluation

## 2024-04-13 ENCOUNTER — Telehealth: Payer: Self-pay | Admitting: Diagnostic Neuroimaging

## 2024-04-13 NOTE — Telephone Encounter (Signed)
 Pt called in regards to referral that suppose to sent out . Pt would like to know where is the referral going. Pt states that she  is still feeling numbness in  both her hands  and is concern what she should do next ?

## 2024-04-16 NOTE — Telephone Encounter (Signed)
 Cld Pt, No Answer. LVM for call back and will also send [facility] message.

## 2024-04-18 ENCOUNTER — Ambulatory Visit (INDEPENDENT_AMBULATORY_CARE_PROVIDER_SITE_OTHER): Admitting: Sports Medicine

## 2024-04-18 VITALS — BP 135/66 | Ht 66.0 in | Wt 305.0 lb

## 2024-04-18 DIAGNOSIS — G609 Hereditary and idiopathic neuropathy, unspecified: Secondary | ICD-10-CM

## 2024-04-18 MED ORDER — DULOXETINE HCL 30 MG PO CPEP: 30.0000 mg | ORAL_CAPSULE | Freq: Every day | ORAL | 1 refills | Status: DC

## 2024-04-18 NOTE — Patient Instructions (Signed)
 Duloxetine is a medication used to help manage nerve pain, including pain from idiopathic (unknown cause) neuropathy. It works by increasing certain natural substances in the brain that help reduce pain signals.  How does duloxetine help with neuropathy? Duloxetine is approved for nerve pain caused by diabetes, but studies show it can also help with other types of nerve pain, including idiopathic neuropathy. It is often chosen because it can reduce pain and is generally well tolerated. The American Academy of Neurology and the American Society of Pain and Neuroscience both recommend duloxetine as a first-line treatment for neuropathic pain.  How do I take duloxetine? Duloxetine comes as a capsule that should be swallowed whole. It can be taken with or without food. The usual starting dose for nerve pain is 30 mg to 60 mg once daily. Start with 30mg  once daily for 2-3 weeks, then we can increase to 60mg  once daily if this isn't effective.  What are the possible side effects? Most people tolerate duloxetine well, but some may experience side effects. The most common side effects are:  Nausea  Dry mouth  Sleepiness or drowsiness  Constipation  Dizziness  Decreased appetite  Sweating more than usual These side effects are usually mild and may go away after a few days or weeks.

## 2024-04-18 NOTE — Progress Notes (Signed)
 PCP: [name], MD  Subjective:   HPI: Patient is a 23 y.o. female here for bilateral arm numbness.  Lauren Obrien states that 5 years ago she began experiencing hand and arm numbness bilaterally. The numbness includes both sides of the hand and fingers, often also including her arm to the shoulder. Ibuprofen  has been unhelpful however wrist splinting and carpal tunnel PT did provide some relief. More recently she began work that requires her to type most of the day and this has worsened her symptoms. She has never had any trauma to the neck or shoulder. She does not drink alcohol or smoke. She has been seen by several providers for this, and workup including EMG in 2020 and 2023, MRI brain in 2025, and MRI C-spine in 2020 were all unremarkable.   Past Medical History:  Diagnosis Date   Acne vulgaris    Cough 11/17/2021   COVID-19 12/11/2020   Dizziness 08/23/2019   Folliculitis 08/29/2019   GERD (gastroesophageal reflux disease)    Helicobacter pylori gastritis    Hidradenitis suppurativa    Neuropathy    BUE   Pharyngitis 11/13/2019   Pre-diabetes    Routine screening for STI (sexually transmitted infection) 12/26/2020   Seasonal allergies    Sleep apnea    Sleep deprivation 09/28/2018   Snoring 01/22/2020   Sore throat 11/13/2019   Strep pharyngitis 05/29/2022   Viral URI 02/01/2020    Current Outpatient Medications on File Prior to Visit  Medication Sig Dispense Refill   benzonatate  (TESSALON ) 100 MG capsule Take 1 capsule (100 mg total) by mouth every 8 (eight) hours. 21 capsule 0   cetirizine  (ZYRTEC ) 10 MG tablet Take 1 tablet (10 mg total) by mouth daily as needed for allergies. 30 tablet 3   fluconazole  (DIFLUCAN ) 150 MG tablet Take by mouth.     fluticasone  (FLONASE ) 50 MCG/ACT nasal spray PLACE 2 SPRAYS INTO BOTH NOSTRILS DAILY AS NEEDED 16 g 3   Multiple Vitamin (MULTIVITAMIN) capsule Take 1 capsule by mouth daily.     promethazine -dextromethorphan (PROMETHAZINE -DM)  6.25-15 MG/5ML syrup Take 5 mLs by mouth every 6 (six) hours as needed for cough. 200 mL 0   rizatriptan  (MAXALT -MLT) 10 MG disintegrating tablet Take 1 tablet (10 mg total) by mouth as needed for migraine. May repeat in 2 hours if needed 9 tablet 11   topiramate  (TOPAMAX ) 50 MG tablet Take 1 tablet (50 mg total) by mouth 2 (two) times daily. 60 tablet 12   No current facility-administered medications on file prior to visit.    Past Surgical History:  Procedure Laterality Date   NO PAST SURGERIES     TOOTH EXTRACTION Bilateral 08/25/2021   Procedure: DENTAL RESTORATION/EXTRACTIONS;  Surgeon: Ascencion Lava, DMD;  Location: [facility];  Service: Oral Surgery;  Laterality: Bilateral;    No Known Allergies  BP 135/66 (BP Location: Left Arm, Patient Position: Sitting, Cuff Size: Normal)   Ht 5\' 6"  (1.676 m)   Wt (!) 305 lb (138.3 kg)   BMI 49.23 kg/m       No data to display              No data to display              Objective:  Physical Exam:  Gen: NAD, comfortable in exam room  Neuro: Sensation: blunted sensation throughout arms bilaterally Reflexes: 2+ bicep, 2+ tricep  MSK: Cervical Spine Inspection: no bruising or visual deformities Palpation: no vertebral or paravertebral tenderness  ROM: flexion 50 deg, extension 50 deg Special Tests: Spurling negative  Shoulders Inspection: no bruises of deformities of either shoulder Palpation: no tenderness to shoulder girdle, clavicle, scapula ROM: flexion 170 deg, abduction 170 deg Strength: abduction 5/5 but reproduces numbness, flexion 5/5 but reproduces numbness, extension 5/5.   Hand Inspection: no bruising, swelling, deformities of hands Palpation: no tenderness to palpation of wrist Strength: wrist ext 5/5, finger and 5/5, thenar add 5/5, grip 5/5 Special Tests: Tinel positive, phalen test positive   Assessment & Plan:  Patient is a 23 y.o. female here for bilateral arm and wrist numbness.  Previous workup including EMG in 2020 and 2023, MRI brain in 2020 and 2025, and MRI cervical spine in 2020, and US  of the wrist in 2022 were all unremarkable for the source of her neuropathy. She has also had B12 testing that was within normal limits. Given this, suspect that her symptoms are due to idiopathic neuropathy verus carpal tunnel or radiculopathy as her imaging has been unremarkable for either.  1. Idiopathic Neuropathy of the Bilateral Arms - Start Duloxetine for neuropathy - Splint both wrists as needed - Follow up in 4-6 weeks  Elnoria Hails, [facility]

## 2024-04-18 NOTE — Addendum Note (Signed)
 Addended by: Lin Rend on: 04/18/2024 07:26 PM   Modules accepted: Level of Service

## 2024-04-19 DIAGNOSIS — F4311 Post-traumatic stress disorder, acute: Secondary | ICD-10-CM | POA: Diagnosis not present

## 2024-04-23 ENCOUNTER — Telehealth: Payer: Self-pay

## 2024-04-23 NOTE — Telephone Encounter (Signed)
 Pt asking for an alternative medication to try for B/L hand numbness.

## 2024-04-24 ENCOUNTER — Other Ambulatory Visit: Payer: Self-pay

## 2024-04-24 MED ORDER — GABAPENTIN 100 MG PO CAPS: 100.0000 mg | ORAL_CAPSULE | Freq: Every day | ORAL | 1 refills | Status: DC

## 2024-04-27 DIAGNOSIS — F4311 Post-traumatic stress disorder, acute: Secondary | ICD-10-CM | POA: Diagnosis not present

## 2024-05-02 ENCOUNTER — Ambulatory Visit (INDEPENDENT_AMBULATORY_CARE_PROVIDER_SITE_OTHER): Admitting: Sports Medicine

## 2024-05-02 VITALS — Ht 66.0 in | Wt 305.0 lb

## 2024-05-02 DIAGNOSIS — G609 Hereditary and idiopathic neuropathy, unspecified: Secondary | ICD-10-CM

## 2024-05-02 DIAGNOSIS — M7661 Achilles tendinitis, right leg: Secondary | ICD-10-CM

## 2024-05-02 NOTE — Progress Notes (Signed)
   PCP: [name], MD  SUBJECTIVE:   HPI:  Patient is a 23 y.o. female here for custom orthotics and has some questions about medication from last visit two weeks ago.   She notes her BUE numbness has not been so bad over the past two weeks, but caveats this as she hasn't had to do much typing which is the typical trigger for her numbness. She took one dose of the Duloxetine  started last visit and states she had 2 days of dizziness and drowsiness so discontinued. Has not tried anything else yet.  Pertinent ROS were reviewed with the patient and found to be negative unless otherwise specified above in HPI.   PERTINENT  PMH / PSH / FH / SH:  Past Medical, Surgical, Social, and Family History Reviewed & Updated in the EMR.  Pertinent findings include:  Obesity Bilateral Upper Extremity Neuropathy Achilles tendinopathy - right  No Known Allergies  OBJECTIVE:  Ht 5' 6 (1.676 m)   Wt (!) 305 lb (138.3 kg)   BMI 49.23 kg/m   PHYSICAL EXAM:  GEN: Alert and Oriented, NAD, comfortable in exam room RESP: Unlabored respirations, symmetric chest rise PSY: normal mood, congruent affect   BILATERAL FOOT MSK EXAM: No visible erythema or swelling.  Mild pes planus and widening of transverse arches. Fairly neutral hind and forefoot at rest, but dynamic hindfoot varus with ambulation. No achilles or peroneal TTP. Assessment & Plan Right Achilles tendinitis Improved, but returns for custom orthotics as below.  Patient was fitted for a: standard, cushioned, semi-rigid orthotic. The orthotic was heated and afterward the patient stood on the orthotic blank positioned on the orthotic stand. The patient was positioned in subtalar neutral position and 10 degrees of ankle dorsiflexion in a weight bearing stance on the heated orthotic blank. After completion of molding, a stable base was applied to the orthotic blank. The blank was ground to a stable position for weight bearing. Size: 10 Base:  [employer]  Posting: Bilateral lateral heel wedges Additional orthotic padding: Scaphoid pads provided at patient's request - were not placed into orthotic today but outlines were drawn.  Hereditary and idiopathic peripheral neuropathy See visit note from 04/18/24. Did not tolerate the Duloxetine . We discussed options including longer trial of Duloxetine  as she only did one dose, no medications, or alternative medication such as Gabapentin . Discussed sedation is a possible side effect of Gabapentin . She elected to try this at 100mg  at bedtime. Will follow-up as needed.   Lin Rend, MD PGY-4, Sports Medicine Fellow [facility]

## 2024-05-04 ENCOUNTER — Telehealth: Payer: Self-pay

## 2024-05-04 DIAGNOSIS — F4311 Post-traumatic stress disorder, acute: Secondary | ICD-10-CM | POA: Diagnosis not present

## 2024-05-04 NOTE — Telephone Encounter (Signed)
 Patient Lauren Obrien on nurse line requesting appointment.   Returned call to patient. She is requesting appointment to further discuss concerns with her nails. She states that she has a history of ingrown toenails and would like referral to specialist.   She also reports intermittent episodes of right sided jaw pain. She states that this has been going on for about one week. Denies chest pain, shortness of breath, arm weakness/numbness or tingling. Denies known dental issue/ abscess, fever, or chills.   Scheduled with Dr. Mauri Sous on 05/08/24.   ED precautions discussed.   Elsie Halo, RN

## 2024-05-08 ENCOUNTER — Ambulatory Visit (INDEPENDENT_AMBULATORY_CARE_PROVIDER_SITE_OTHER): Admitting: Family Medicine

## 2024-05-08 ENCOUNTER — Encounter: Payer: Self-pay | Admitting: Family Medicine

## 2024-05-08 VITALS — BP 119/66 | HR 92 | Ht 66.0 in | Wt 302.6 lb

## 2024-05-08 DIAGNOSIS — H6123 Impacted cerumen, bilateral: Secondary | ICD-10-CM

## 2024-05-08 DIAGNOSIS — L609 Nail disorder, unspecified: Secondary | ICD-10-CM | POA: Diagnosis not present

## 2024-05-08 DIAGNOSIS — S0300XA Dislocation of jaw, unspecified side, initial encounter: Secondary | ICD-10-CM | POA: Diagnosis present

## 2024-05-08 MED ORDER — CIPROFLOXACIN HCL 500 MG PO TABS
500.0000 mg | ORAL_TABLET | Freq: Two times a day (BID) | ORAL | 0 refills | Status: AC
Start: 2024-05-08 — End: 2024-05-15

## 2024-05-08 NOTE — Progress Notes (Signed)
 SUBJECTIVE:   CHIEF COMPLAINT / HPI:   Right jaw pain Right jaw started hurting last week. Hurts when she speaks or when she is eating. Says the pain radiates to near her temple. She mentions that she thinks she grinds her teeth and that her mouth feels tight in the morning. Has never noticed left jaw pain. Has never had this before. No more stress than usual. She uses a cpap for sleep apnea.   Left ear pain  Patient mentioned ear pain started hurting last week. Described as a sharp pain that would come and go. She said it hurt more when she would yawn. No fever. Patient said pain went away 3 days ago. She says she often gets lots of wax in her ear.  Patient says that she was trying to get a lot of wax out of her ear with her pinky finger.  Denies fevers, dizziness, tinnitus.  Says that she has had to get her ears cleaned out multiple times.  Nail discoloration  Patient says that her left thumbnail has been discolored, with pitting in the nail, with some white stuff underneath the nail for about 2 years now.  She has been seen in the clinic about a year ago and had gotten a nail sample taken at that time for culture but was not done for some reason.  Patient says she feels like it has been getting worse as she now has pain underneath that nail.  She feels like it is sharp pain.  Does not get her nails done or get manicures.  Does not have an occupation that requires a lot of handwashing or soaking of the hands or nails.  Has never had any issues with her other fingernails.  Patient denies any trauma to this fingernail. PERTINENT  PMH / PSH: OSA,   OBJECTIVE:   BP 119/66   Pulse 92   Ht 5' 6 (1.676 m)   Wt (!) 302 lb 9.6 oz (137.3 kg)   SpO2 98%   BMI 48.84 kg/m   General: well appearing, in no acute distress HEENT: No conjunctival abnormalities, both ear canals with impacted cerumen pressed towards the TM.  No abnormalities in the outer ear. CV: RRR, radial pulses equal and  palpable, no BLE edema, cap refill less than 2 seconds Resp: Normal work of breathing on room air, CTAB MSK: Bilateral masseter muscles more hypertrophied, on palpation of right masseter muscle going up to temple patient notes some pain and soreness.  Mild occasional clicking when opening and closing jaw. Derm: Left thumbnail with slight green discoloration at the tip of the thumbnail, extensive pitting of the thumbnail, possible splinter hemorrhages in the thumbnail.  All other fingernails appear normal except for very mild pitting and 1 or 2 splinter hemorrhages of the right index finger nail.  (Picture in media tab)   ASSESSMENT/PLAN:   Assessment & Plan TMJ (dislocation of temporomandibular joint), initial encounter Jaw pain most likely TMJ given distribution and pain over masseter muscle with some clicking.  Most likely due to bruxism.  Recommended conservative measures for treatment given first encounter. - Offered resources outlining conservative treatment such as ice, massage, short course of NSAIDs. - Discussed possibility of mouthguard in the future if symptoms continue to be bothersome. Impacted cerumen of both ears Ear pain most likely due to impacted cerumen or could have had some eustachian tube dysfunction.  However now is resolved. - For impacted cerumen recommended over-the-counter Debrox drops for about 1 week. -  Encourage patient to avoid Q-tips or poking in her ear to try to clear out cerumen. Nail abnormalities DDx for nail discoloration and abnormality includes green nail syndrome, onychomycosis, splinter hemorrhages.  Given worsening in pain we will try to empirically treat with ciprofloxacin 500 mg twice daily for 7 days.  Also took a nail sample for culture.  Appearance is not classic for onychomycosis.  Pitting in fingernails could be due to psoriasis however patient does not have any other symptoms of psoriasis and pitting is mainly in 1 fingernail.  Splinter hemorrhages  could be associated with bacterial endocarditis, vasculitis.  However patient does not have any other associated symptoms at this time and has this 1 fingernail. - Will proceed with empiric antibiotic treatment if this fails will follow-up with patient for further evaluation   Recommended patient come in for annual physical as has not been seen for annual physical in years.  Areta Saliva, MD [facility]

## 2024-05-08 NOTE — Patient Instructions (Signed)
 It was wonderful to see you today.  Please bring ALL of your medications with you to every visit.   Today we talked about:  Ear discomfort - You can get debrox drops over the counter. You can use this for a week to soften the wax. At that time if the wax is not fully coming out we can flush the ears; however, it should start to come out on its own.   Your jaw pain is most likely caused by something called TMJ or temporomandibular joint syndrome this is caused by increased tension in this area. I have attached resources but you can use ice, massage, and a short course of ibuprofen  to help with the symptoms. If it continues long term we can look into a mouth guard.   For your nail discoloration and pain. I will prescribe a short course of antibiotics in case it is an infection. We will also send the nail for culture. However, it is not a clear case and could be caused by other things, so we will need to follow this up.   You are due for an annual physical. Please make an appointment in a month to follow up   Thank you for choosing [facility].   Please call [phone] with any questions about today's appointment.  Please be sure to schedule follow up at the front desk before you leave today.   Areta Saliva, MD  Family Medicine

## 2024-05-30 LAB — FUNGUS CULTURE, BLOOD

## 2024-06-01 ENCOUNTER — Ambulatory Visit: Payer: Self-pay | Admitting: Family Medicine

## 2024-10-22 ENCOUNTER — Telehealth: Payer: Self-pay | Admitting: Neurology

## 2024-10-22 DIAGNOSIS — G43109 Migraine with aura, not intractable, without status migrainosus: Secondary | ICD-10-CM

## 2024-10-22 DIAGNOSIS — G4719 Other hypersomnia: Secondary | ICD-10-CM

## 2024-10-22 DIAGNOSIS — G4733 Obstructive sleep apnea (adult) (pediatric): Secondary | ICD-10-CM

## 2024-10-22 NOTE — Telephone Encounter (Signed)
 Pt called to request to speak to MD about getting a new CPAP machine Pt stated that   her old one is very damage and is not in good use . Pt spoke to DME  [employer] . [employer] requested for Pt to call MD to get new order sent over .

## 2024-10-22 NOTE — Telephone Encounter (Signed)
     Patient was last seen 03/28/24 and her next appointment is 03/11/24  - Please advise if patient needs an earlier appointment for a new CPAP machine. Machine set up date 04/24/2020 and Current machine is an AirSense 10 AutoSet

## 2024-10-23 ENCOUNTER — Ambulatory Visit: Payer: MEDICAID | Admitting: Family Medicine

## 2024-10-23 ENCOUNTER — Telehealth: Payer: Self-pay | Admitting: Neurology

## 2024-10-23 ENCOUNTER — Encounter: Payer: Self-pay | Admitting: Family Medicine

## 2024-10-23 VITALS — BP 118/72 | HR 83 | Wt 301.6 lb

## 2024-10-23 DIAGNOSIS — Z30432 Encounter for removal of intrauterine contraceptive device: Secondary | ICD-10-CM

## 2024-10-23 DIAGNOSIS — R200 Anesthesia of skin: Secondary | ICD-10-CM

## 2024-10-23 DIAGNOSIS — Z1159 Encounter for screening for other viral diseases: Secondary | ICD-10-CM

## 2024-10-23 DIAGNOSIS — G4733 Obstructive sleep apnea (adult) (pediatric): Secondary | ICD-10-CM

## 2024-10-23 DIAGNOSIS — R7303 Prediabetes: Secondary | ICD-10-CM

## 2024-10-23 DIAGNOSIS — G4719 Other hypersomnia: Secondary | ICD-10-CM

## 2024-10-23 NOTE — Patient Instructions (Addendum)
 It was wonderful to see you today.  Please bring ALL of your medications with you to every visit.   Annual physical - return tomorrow to get your labs   For your wrist pain and numbness I have put in an order for wrist splints. I also believe you have carpal tunnel but I have placed a referral for a neurologist for a second opinion.   For your shortness of breath I have made an appointment for follow up   For your nail abnormalities I have made an appointment with our dermatology clinic at the family medicine clinic.   Thank you for choosing [facility].   Please call [phone] with any questions about today's appointment.  Areta Saliva, MD  Family Medicine

## 2024-10-23 NOTE — Telephone Encounter (Signed)
 Patient's next appointment is 03/11/24  - Please advise if patient needs an earlier appointment for a new CPAP machine. Machine set up date 04/24/2020 and Current machine is an AirSense 10 AutoSet. From what I could pull from resmed she has not used her machine in almost 2 months
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
## 2024-10-24 ENCOUNTER — Other Ambulatory Visit: Payer: MEDICAID

## 2024-10-24 ENCOUNTER — Telehealth: Payer: Self-pay

## 2024-10-24 DIAGNOSIS — R7303 Prediabetes: Secondary | ICD-10-CM | POA: Diagnosis not present

## 2024-10-24 DIAGNOSIS — Z1159 Encounter for screening for other viral diseases: Secondary | ICD-10-CM

## 2024-10-24 DIAGNOSIS — G5603 Carpal tunnel syndrome, bilateral upper limbs: Secondary | ICD-10-CM

## 2024-10-24 LAB — POCT GLYCOSYLATED HEMOGLOBIN (HGB A1C): HbA1c, POC (prediabetic range): 5.7 % (ref 5.7–6.4)

## 2024-10-24 NOTE — Telephone Encounter (Signed)
 During clinic yesterday afternoon, provider gave verbal orders for bilateral wrist splints with thumb spica.   Started process for dispensing equipment, however, patient voiced concerns about thumb spica. She states that her previous brace did not include this and she is worried that she will not be able to type using this brace.   She is requesting generic wrist splints that does not include thumb spica.   Place equipment back in clinic inventory. I have pended appropriate DME order to this encounter.   Forwarding to Dr. Nicholas.   Chiquita JAYSON English, RN

## 2024-10-24 NOTE — Telephone Encounter (Signed)
 Community message has been sent to [employer] for pressure and supplies on 10/24/24. DD

## 2024-10-25 ENCOUNTER — Ambulatory Visit: Payer: Self-pay | Admitting: Family Medicine

## 2024-10-25 ENCOUNTER — Ambulatory Visit: Payer: MEDICAID | Admitting: Family Medicine

## 2024-10-25 VITALS — BP 112/68 | Ht 66.0 in | Wt 301.0 lb

## 2024-10-25 DIAGNOSIS — G5603 Carpal tunnel syndrome, bilateral upper limbs: Secondary | ICD-10-CM

## 2024-10-25 DIAGNOSIS — M722 Plantar fascial fibromatosis: Secondary | ICD-10-CM | POA: Diagnosis not present

## 2024-10-25 LAB — HEPATITIS C ANTIBODY: Hep C Virus Ab: NONREACTIVE

## 2024-10-25 NOTE — Progress Notes (Unsigned)
 PCP: Lauren Bar, MD  Patient is a 23 y.o. female here for bilateral hand pain/numbness and bilateral foot pain.  Bilateral hand pain/numbness Protracted course of symptoms since ~2018. Intermittent pain ~2x per month. Aggravated by typing. Does believe she has some weakness L>R. There is a consideration for carpal tunnel and [facility] has ordered bilateral thumb spica braces yesterday, not yet received. Patient is not currently working, but symptoms are worse when she works. EMG in Feb 2023 and Oct 2020 were unremarkable. Patient previously saw Lauren Obrien (Ortho) with the [facility] in 2023, who suggested surgery, but the second negative EMG was normal and surgery was not pursued. Patient expresses notable frustration given extensive course of multiple tests yielding inconclusive results, unclear further steps.  Bilateral foot pain Has had pain on the bottom of here feet after being on her feet for a while (walking, standing). Patient has orthotics from [facility], made 05/02/24 for right achilles tendinitis. Orthotics were helping, but approximately a month ago, the bottom of her feet have been hurting. Patient also reports that she has lost her custom orthotics from before. The patient states that when she gets up out of bed first thing in the morning, she feels sharp pain with the first few steps.   Past Medical History:  Diagnosis Date   Acne vulgaris    Cough 11/17/2021   COVID-19 12/11/2020   Dizziness 08/23/2019   Folliculitis 08/29/2019   GERD (gastroesophageal reflux disease)    Helicobacter pylori gastritis    Hidradenitis suppurativa    Neuropathy    BUE   Pharyngitis 11/13/2019   Pre-diabetes    Routine screening for STI (sexually transmitted infection) 12/26/2020   Seasonal allergies    Sleep apnea    Sleep deprivation 09/28/2018   Snoring 01/22/2020   Sore throat 11/13/2019   Strep pharyngitis 05/29/2022   Viral URI 02/01/2020    Medications Ordered  Prior to Encounter[1]  Past Surgical History:  Procedure Laterality Date   NO PAST SURGERIES     TOOTH EXTRACTION Bilateral 08/25/2021   Procedure: DENTAL RESTORATION/EXTRACTIONS;  Surgeon: Lauren Obrien, DMD;  Location: [facility];  Service: Oral Surgery;  Laterality: Bilateral;    Allergies[2]  BP 112/68   Ht 5' 6 (1.676 m)   Wt (!) 301 lb (136.5 kg)   LMP 10/03/2024   BMI 48.58 kg/m       No data to display              No data to display              Objective:  Physical Exam:  Gen: NAD, comfortable in exam room  Location: Bilateral wrists - Inspection: No visible edema, deformity, lesions, or other overlying skin changes - Palpation: No TTP over snuffbox and remainder of hand/wrist - ROM: Unrestricted in all directions - Strength: 5/5 handgrip bilaterally, mild thenar atrophy - Special Tests: Positive Tinel's, positive Phalen's, negative Finkelstein's - Neurovascular: 2+ radial pulses bilaterally, individual fingers intact to light touch, intermittent paresthesias with testing  Location: Bilateral feet - Inspection: No visible edema, deformity, lesions, or other overlying skin changes, mild pronation inward when standing - Palpation: TTP over plantar fascia insertion on medial calcaneus, no TTP over Achilles tendon bilaterally - ROM: No limitation with plantarflexion or dorsiflexion - Strength: 5/5 dorsiflexion plantarflexion - Special Tests: Negative Tinel's, negative Thompson's, negative calcaneal squeeze - Neurovascular: 2+ DP and PT pulses bilaterally, no paresthesias   Assessment and Plan:   Bilateral  carpal tunnel Testing and symptomatology consistent with carpal tunnel.  Do note previously negative EMG, however this was over 2 years ago and does not exclude carpal tunnel.  Patient would benefit from a formal hand surgery evaluation for more conclusive answers and further steps. - Ambulatory referral to hand surgery ([facility] @  [facility]) - Continue with thumb spica plan from [facility]  Bilateral plantar fasciitis Symptoms and exam consistent with plantar fasciitis, no evidence of prior Achilles tendinitis. - Home exercises provided, return for formal PT if desired/not improving - Return at earliest convenience for repeat custom orthotics fitting - Follow-up in 6 weeks or sooner if needed  Lauren Garrison Toma, MD PGY-2, [facility] 10/25/2024, 3:47 PM     [1]  Current Outpatient Medications on File Prior to Visit  Medication Sig Dispense Refill   benzonatate  (TESSALON ) 100 MG capsule Take 1 capsule (100 mg total) by mouth every 8 (eight) hours. 21 capsule 0   cetirizine  (ZYRTEC ) 10 MG tablet Take 1 tablet (10 mg total) by mouth daily as needed for allergies. 30 tablet 3   fluconazole  (DIFLUCAN ) 150 MG tablet Take by mouth.     fluticasone  (FLONASE ) 50 MCG/ACT nasal spray PLACE 2 SPRAYS INTO BOTH NOSTRILS DAILY AS NEEDED 16 g 3   gabapentin  (NEURONTIN ) 100 MG capsule Take 1 capsule (100 mg total) by mouth at bedtime. 30 capsule 1   Multiple Vitamin (MULTIVITAMIN) capsule Take 1 capsule by mouth daily.     promethazine -dextromethorphan (PROMETHAZINE -DM) 6.25-15 MG/5ML syrup Take 5 mLs by mouth every 6 (six) hours as needed for cough. 200 mL 0   rizatriptan  (MAXALT -MLT) 10 MG disintegrating tablet Take 1 tablet (10 mg total) by mouth as needed for migraine. May repeat in 2 hours if needed 9 tablet 11   topiramate  (TOPAMAX ) 50 MG tablet Take 1 tablet (50 mg total) by mouth 2 (two) times daily. 60 tablet 12   No current facility-administered medications on file prior to visit.  [2] No Known Allergies

## 2024-10-25 NOTE — Assessment & Plan Note (Signed)
 History of prediabetes. Is not on any medications for this at this time. Will recheck A1c today.

## 2024-10-25 NOTE — Patient Instructions (Signed)
 Please schedule a f/u appt for custom orthotics

## 2024-10-25 NOTE — Telephone Encounter (Signed)
 Signed orders per Dr. Nicholas. Community message sent to [employer] for processing.   Chiquita JAYSON English, RN

## 2024-10-26 ENCOUNTER — Encounter: Payer: Self-pay | Admitting: Family Medicine

## 2024-10-26 NOTE — Telephone Encounter (Signed)
 Receipt confirmed by [employer].   Chiquita JAYSON English, RN

## 2024-10-30 ENCOUNTER — Ambulatory Visit: Payer: MEDICAID | Admitting: Family Medicine

## 2024-10-30 VITALS — Ht 66.0 in | Wt 301.0 lb

## 2024-10-30 DIAGNOSIS — R269 Unspecified abnormalities of gait and mobility: Secondary | ICD-10-CM

## 2024-10-30 DIAGNOSIS — M722 Plantar fascial fibromatosis: Secondary | ICD-10-CM

## 2024-10-30 NOTE — Patient Instructions (Signed)
 Thank you for coming to see me today. It was a pleasure.   We made you new orthotics. Let us know if we need to add any extra cushioning or make changes.  If you have any questions or concerns, please do not hesitate to call the office at [phone].

## 2024-10-30 NOTE — Progress Notes (Signed)
 DATE OF VISIT: 10/30/2024        Lauren Obrien DOB: 07-03-2001 MRN: [id number]  CC:  Custom orthotics  History of present Illness: Lauren Obrien is a 23 y.o. female who presents for a follow-up visit for custom orthotics History of bilateral foot pain, last seen by me 10/25/2024, currently with plantar fasciitis History of Achilles tendinitis in the past Previous custom orthotics June 2025, but she lost this set Has been using sports insoles with scaphoid pads which are helpful, but still having plantar fascia pain Here for updated custom orthotics today  Medications:  Outpatient Encounter Medications as of 10/30/2024  Medication Sig   benzonatate  (TESSALON ) 100 MG capsule Take 1 capsule (100 mg total) by mouth every 8 (eight) hours.   cetirizine  (ZYRTEC ) 10 MG tablet Take 1 tablet (10 mg total) by mouth daily as needed for allergies.   fluconazole  (DIFLUCAN ) 150 MG tablet Take by mouth.   fluticasone  (FLONASE ) 50 MCG/ACT nasal spray PLACE 2 SPRAYS INTO BOTH NOSTRILS DAILY AS NEEDED   gabapentin  (NEURONTIN ) 100 MG capsule Take 1 capsule (100 mg total) by mouth at bedtime.   Multiple Vitamin (MULTIVITAMIN) capsule Take 1 capsule by mouth daily.   promethazine -dextromethorphan (PROMETHAZINE -DM) 6.25-15 MG/5ML syrup Take 5 mLs by mouth every 6 (six) hours as needed for cough.   rizatriptan  (MAXALT -MLT) 10 MG disintegrating tablet Take 1 tablet (10 mg total) by mouth as needed for migraine. May repeat in 2 hours if needed   topiramate  (TOPAMAX ) 50 MG tablet Take 1 tablet (50 mg total) by mouth 2 (two) times daily.   No facility-administered encounter medications on file as of 10/30/2024.    Allergies: has no known allergies.  Physical Examination: Vitals: Ht 5' 6 (1.676 m)   Wt (!) 301 lb (136.5 kg)   LMP 10/03/2024   BMI 48.58 kg/m  GENERAL:  Lauren Obrien is a 23 y.o. female appearing their stated age, alert and oriented x 3, in no apparent distress.  SKIN: no  rashes or lesions, skin clean, dry, intact MSK: Bilateral feet with no swelling or redness.  Has pes planus, some widening of transverse arches.  Has dynamic hindfoot varus with ambulation.  No leg length difference.  Walking without a limp Neurovascular intact distally  Assessment & Plan  1. Plantar fasciitis of left foot 2. Abnormality of gait Plantar fasciitis with associated gait abnormality.  Has done well with custom orthotics in the past, but lost pair that she had fabricated June 2025  Plan: - Updated orthotics fabricated today.  These were comfortable in the office - Recommended over-the-counter [place] sock at bedtime to help with plantar fascia symptoms - Should continue her plantar fascia exercises as we discussed last visit - Follow-up 6 to 8 weeks if has ongoing symptoms, sooner as needed  Patient was fitted for a : Fit 'n Run semi-rigid orthotic  The orthotic was heated, placed on the orthotic stand. The patient was positioned in subtalar neutral position and 10 degrees of ankle dorsiflexion in a weight bearing stance on the heated orthotic blank After completion of molding Blank: Fit 'n Run - size 11 Posting:  bilateral lateral heel wedge Base: none  Orthotics were comfortable and had more neutral gait in office today.  Patient expressed understanding & agreement with above.  Encounter Diagnoses  Name Primary?   Plantar fasciitis of left foot Yes   Abnormality of gait     No orders of the defined types were placed in this encounter.

## 2024-11-09 ENCOUNTER — Ambulatory Visit (HOSPITAL_COMMUNITY)
Admission: EM | Admit: 2024-11-09 | Discharge: 2024-11-09 | Disposition: A | Discharge status: Home / Self Care | Payer: MEDICAID | Attending: Physician Assistant | Admitting: Physician Assistant

## 2024-11-09 ENCOUNTER — Encounter (HOSPITAL_COMMUNITY): Payer: Self-pay | Admitting: Emergency Medicine

## 2024-11-09 DIAGNOSIS — R531 Weakness: Secondary | ICD-10-CM

## 2024-11-09 DIAGNOSIS — B349 Viral infection, unspecified: Secondary | ICD-10-CM

## 2024-11-09 DIAGNOSIS — R52 Pain, unspecified: Secondary | ICD-10-CM | POA: Diagnosis not present

## 2024-11-09 LAB — POCT URINE DIPSTICK
Bilirubin, UA: NEGATIVE
Blood, UA: NEGATIVE
Glucose, UA: NEGATIVE mg/dL
Ketones, POC UA: NEGATIVE mg/dL
Leukocytes, UA: NEGATIVE
Nitrite, UA: NEGATIVE
Protein Ur, POC: NEGATIVE mg/dL
Spec Grav, UA: >=1.03 — AB
Urobilinogen, UA: 0.2 U/dL
pH, UA: 5.5

## 2024-11-09 LAB — POC SOFIA SARS ANTIGEN FIA: SARS Coronavirus 2 Ag: NEGATIVE

## 2024-11-09 LAB — POCT RAPID STREP A (OFFICE): Rapid Strep A Screen: NEGATIVE

## 2024-11-09 LAB — POCT INFLUENZA A/B
Influenza A, POC: NEGATIVE
Influenza B, POC: NEGATIVE

## 2024-11-09 MED ORDER — IBUPROFEN 600 MG PO TABS: 600.0000 mg | ORAL_TABLET | Freq: Three times a day (TID) | ORAL | 0 refills | Status: AC | PRN

## 2024-11-09 NOTE — Discharge Instructions (Signed)
 You were negative for COVID, flu, strep.  Your urine showed that you have not had enough to drink today so I recommend pushing fluids.  I believe that you have a different viral illness which will hopefully get better with time.  Take ibuprofen  600 mg 3 times a day and alternate this with over-the-counter Tylenol  to help with pain.  Follow-up with your primary care next week if your symptoms have not improved.  If anything worsens and you have high fever, worsening cough, shortness of breath, chest pain, nausea/vomiting, feel like you went to pass out you should be seen immediately.

## 2024-11-09 NOTE — ED Triage Notes (Signed)
 Pt reports body aches, fatigue, headaches, congestion and cough that started yesterday. Ibuprofen  600 mg last dose 1128am.

## 2024-11-09 NOTE — ED Provider Notes (Signed)
 " [facility]    CSN: [id number] Arrival date & time: 11/09/24  1723      History   Chief Complaint Chief Complaint  Patient presents with   Generalized Body Aches   Nasal Congestion   Cough    HPI Lauren Obrien is a 23 y.o. female.   Patient presents today with a 24-hour history of URI symptoms.  She reports body aches, subjective fever, fatigue, cough, congestion.  She denies any nausea, vomiting, diarrhea, shortness of breath, chest pain.  Reports that her son has been sick with both COVID and influenza and was recently diagnosed with pneumonia.  She denies any additional sick contacts.  She has been taking ibuprofen  with last dose approximately 6 hours ago.  She has not taken additional over-the-counter medication for symptom management.  Denies any recent antibiotics or steroids.  She is confident that she is not pregnant.  She reports that she is just generally feeling very weak and had to get her brother to take her to be evaluated.    Past Medical History:  Diagnosis Date   Acne vulgaris    Cough 11/17/2021   COVID-19 12/11/2020   Dizziness 08/23/2019   Folliculitis 08/29/2019   GERD (gastroesophageal reflux disease)    Helicobacter pylori gastritis    Hidradenitis suppurativa    Neuropathy    BUE   Pharyngitis 11/13/2019   Pre-diabetes    Routine screening for STI (sexually transmitted infection) 12/26/2020   Seasonal allergies    Sleep apnea    Sleep deprivation 09/28/2018   Snoring 01/22/2020   Sore throat 11/13/2019   Strep pharyngitis 05/29/2022   Viral URI 02/01/2020    Patient Active Problem List   Diagnosis Date Noted   Generalized headaches 02/24/2024   Gastrointestinal complaints 11/28/2023   Polydipsia 10/11/2023   Chronic midline low back pain without sciatica 06/10/2023   Nail discoloration 03/28/2023   Umbilical discharge 05/04/2022   Paresthesias 03/20/2022   Right wrist tendinitis 06/06/2020   Morbid obesity (HCC)  01/22/2020   Chronic fatigue 01/22/2020   Excessive daytime sleepiness 01/22/2020   OSA (obstructive sleep apnea) 01/22/2020   Oropharyngeal dysphagia 01/11/2020   Prediabetes 08/29/2019   Dizziness 08/23/2019   Memory difficulties 07/01/2019   Chronic tension type headache 05/29/2019   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   Hidradenitis suppurativa 03/04/2019   Carpal tunnel syndrome of left wrist 01/04/2019   Learning difficulty 12/16/2018   Left wrist tendinitis 12/01/2018   Irregular menstrual bleeding 04/03/2018   Acid reflux 01/21/2016   Chronic constipation 07/24/2015    Past Surgical History:  Procedure Laterality Date   NO PAST SURGERIES     TOOTH EXTRACTION Bilateral 08/25/2021   Procedure: DENTAL RESTORATION/EXTRACTIONS;  Surgeon: Sheryle Hamilton, DMD;  Location: [facility];  Service: Oral Surgery;  Laterality: Bilateral;    OB History     Gravida  2   Para  1   Term  1   Preterm  0   AB  0   Living  1      SAB  0   IAB  0   Ectopic  0   Multiple      Live Births  1            Home Medications    Prior to Admission medications  Medication Sig Start Date End Date Taking? Authorizing Provider  ibuprofen  (ADVIL ) 600 MG tablet Take 1 tablet (600 mg total) by mouth every 8 (  eight) hours as needed. 11/09/24  Yes Nehal Shives, Rocky POUR, PA-C    Family History Family History  Problem Relation Age of Onset   Diabetes Father    Hypertension Father    Asthma Maternal Grandmother    Diabetes Maternal Grandmother    Diabetes Maternal Grandfather    Diabetes Paternal Grandmother    Diabetes Paternal Grandfather    Colon cancer Neg Hx    Esophageal cancer Neg Hx    Pancreatic cancer Neg Hx    Stomach cancer Neg Hx    Liver disease Neg Hx     Social History Social History[1]   Allergies   Patient has no known allergies.   Review of Systems Review of Systems  Constitutional:  Positive for activity change, fatigue  and fever. Negative for appetite change.  HENT:  Positive for congestion and sore throat. Negative for sinus pressure and sneezing.   Respiratory:  Positive for cough. Negative for shortness of breath.   Cardiovascular:  Negative for chest pain.  Gastrointestinal:  Negative for abdominal pain, diarrhea, nausea and vomiting.  Neurological:  Negative for dizziness, light-headedness and headaches.     Physical Exam Triage Vital Signs ED Triage Vitals  Encounter Vitals Group     BP 11/09/24 1907 105/77     Girls Systolic BP Percentile --      Girls Diastolic BP Percentile --      Boys Systolic BP Percentile --      Boys Diastolic BP Percentile --      Pulse Rate 11/09/24 1907 85     Resp 11/09/24 1907 19     Temp 11/09/24 1907 98.1 F (36.7 C)     Temp Source 11/09/24 1907 Oral     SpO2 11/09/24 1907 97 %     Weight --      Height --      Head Circumference --      Peak Flow --      Pain Score 11/09/24 1905 9     Pain Loc --      Pain Education --      Exclude from Growth Chart --    No data found.  Updated Vital Signs BP 105/77 (BP Location: Left Arm)   Pulse 85   Temp 98.1 F (36.7 C) (Oral)   Resp 19   LMP 11/04/2024 (Exact Date)   SpO2 97%   Visual Acuity Right Eye Distance:   Left Eye Distance:   Bilateral Distance:    Right Eye Near:   Left Eye Near:    Bilateral Near:     Physical Exam Vitals reviewed.  Constitutional:      General: She is awake. She is not in acute distress.    Appearance: Normal appearance. She is well-developed. She is not ill-appearing.     Comments: Very pleasant female appears stated age in no acute distress sitting comfortably in exam room  HENT:     Head: Normocephalic and atraumatic.     Right Ear: Tympanic membrane, ear canal and external ear normal. Tympanic membrane is not erythematous or bulging.     Left Ear: Tympanic membrane, ear canal and external ear normal. Tympanic membrane is not erythematous or bulging.     Nose:      Right Sinus: No maxillary sinus tenderness or frontal sinus tenderness.     Left Sinus: No maxillary sinus tenderness or frontal sinus tenderness.     Mouth/Throat:     Pharynx: Uvula midline. Posterior oropharyngeal  erythema present. No oropharyngeal exudate.  Cardiovascular:     Rate and Rhythm: Normal rate and regular rhythm.     Heart sounds: Normal heart sounds, S1 normal and S2 normal. No murmur heard. Pulmonary:     Effort: Pulmonary effort is normal.     Breath sounds: Normal breath sounds. No wheezing, rhonchi or rales.     Comments: Clear to auscultation bilaterally Psychiatric:        Behavior: Behavior is cooperative.      UC Treatments / Results  Labs (all labs ordered are listed, but only abnormal results are displayed) Labs Reviewed  POCT URINE DIPSTICK - Abnormal; Notable for the following components:      Result Value   Spec Grav, UA >=1.030 (*)    All other components within normal limits  POCT RAPID STREP A (OFFICE) - Normal  POC SOFIA SARS ANTIGEN FIA - Normal  POCT INFLUENZA A/B    EKG   Radiology No results found.  Procedures Procedures (including critical care time)  Medications Ordered in UC Medications - No data to display  Initial Impression / Assessment and Plan / UC Course  I have reviewed the triage vital signs and the nursing notes.  Pertinent labs & imaging results that were available during my care of the patient were reviewed by me and considered in my medical decision making (see chart for details).     Patient is well-appearing, afebrile, nontoxic, nontachycardic.  No evidence of acute infection on physical exam that warranted initiation of antibiotics.  COVID and flu testing were negative in clinic.  She did report a sore throat and so strep testing was obtained that was negative.  We discussed that she likely has a viral illness but is unclear which specific virus.  We did discuss potential utility of empiric treatment but  given she is young and otherwise generally healthy we elected to defer antiviral therapy.  Urine was obtained that did show high specific vomiting and we discussed that this could be contributing to her feeling weak and poorly.  Recommended that she push fluids.  She is to alternate Tylenol  and ibuprofen  for pain and was given ibuprofen  600 mg to be taken up to 3 times a day.  We discussed that she should not take additional NSAIDs with this medication due to risk of GI bleeding.  No indication for dose adjustment based on metabolic panel from 10/11/2023 with a creatinine of 0.53 and eGFR greater than 60 mL/min.  We discussed that if she is not feeling better within 3 to 5 days she should return for reevaluation.  If anything worsens and she has high fever, near-syncope or syncopal episodes, chest pain, shortness of breath, weakness she needs to be seen emergently.  Return precautions given.  Excuse note provided.  Final Clinical Impressions(s) / UC Diagnoses   Final diagnoses:  Generalized weakness  Viral illness  Body aches     Discharge Instructions      You were negative for COVID, flu, strep.  Your urine showed that you have not had enough to drink today so I recommend pushing fluids.  I believe that you have a different viral illness which will hopefully get better with time.  Take ibuprofen  600 mg 3 times a day and alternate this with over-the-counter Tylenol  to help with pain.  Follow-up with your primary care next week if your symptoms have not improved.  If anything worsens and you have high fever, worsening cough, shortness of breath, chest  pain, nausea/vomiting, feel like you went to pass out you should be seen immediately.     ED Prescriptions     Medication Sig Dispense Auth. Provider   ibuprofen  (ADVIL ) 600 MG tablet Take 1 tablet (600 mg total) by mouth every 8 (eight) hours as needed. 30 tablet Maayan Jenning K, PA-C      PDMP not reviewed this encounter.    [1]  Social  History Tobacco Use   Smoking status: Never    Passive exposure: Never   Smokeless tobacco: Never  Vaping Use   Vaping status: Never Used  Substance Use Topics   Alcohol use: No    Alcohol/week: 0.0 standard drinks of alcohol   Drug use: No     Sherrell Rocky POUR, PA-C 11/09/24 2028  "

## 2024-11-12 ENCOUNTER — Ambulatory Visit (INDEPENDENT_AMBULATORY_CARE_PROVIDER_SITE_OTHER): Payer: MEDICAID

## 2024-11-12 VITALS — BP 108/62 | HR 76 | Ht 66.0 in | Wt 303.4 lb

## 2024-11-12 DIAGNOSIS — G9339 Other post infection and related fatigue syndromes: Secondary | ICD-10-CM | POA: Diagnosis not present

## 2024-11-12 LAB — POC SOFIA 2 FLU + SARS ANTIGEN FIA
Influenza A, POC: NEGATIVE
Influenza B, POC: NEGATIVE
SARS Coronavirus 2 Ag: NEGATIVE

## 2024-11-12 LAB — POCT RAPID STREP A (OFFICE): Rapid Strep A Screen: NEGATIVE

## 2024-11-12 NOTE — Patient Instructions (Signed)
" ° °  It was great to see you!  Our plans for today:   We are checking some labs today, we will release these results to your MyChart.  Take care and seek immediate care sooner if you develop any concerns.       Houston Coralee HAS PGY 1 Family Medicine Resident [facility]  [address] [place], [place] [address] Fax [phone] Phone [phone] 11/12/2024, 4:29 PM  "

## 2024-11-12 NOTE — Progress Notes (Unsigned)
" ° ° °  SUBJECTIVE:   CHIEF COMPLAINT / HPI:   Lauren Obrien is a 23 YO female present with concerns as follow  Generalize fatigue Pt w/ flu symptoms starting last Tuesday which prompt her to visit an urgent care 3 days ago which show negative for COVID, Flu, and Strep. He symptoms has improved but she still experiencing fatigue. Planing on tonsillectomy on Friday with ENT which she would like to ensure that she still doesn't has flu    PERTINENT  PMH / PSH:  OSA GERD  OBJECTIVE:   BP 108/62   Pulse 76   Ht 5' 6 (1.676 m)   Wt (!) 303 lb 6.4 oz (137.6 kg)   LMP 11/04/2024 (Exact Date)   SpO2 99%   BMI 48.97 kg/m    Physical Exam Constitutional:      Appearance: Normal appearance.  Cardiovascular:     Rate and Rhythm: Normal rate.  Pulmonary:     Effort: Pulmonary effort is normal.     Breath sounds: Normal breath sounds.  Abdominal:     Palpations: Abdomen is soft.  Neurological:     Mental Status: She is alert.     ASSESSMENT/PLAN:   Assessment & Plan Other post infection and related fatigue syndromes Her flu symptoms has been improved but with her tonsillectomy scheduled on Friday pt would like to ensure that she is good to get the surgery as scheduled  - Repeat flu, COVID, and Strep today pending result - CBC, CMP, w/ Thyroid  functions pending result - f/u as needed   Houston Samuels, DO [facility] "

## 2024-11-13 ENCOUNTER — Ambulatory Visit: Payer: Self-pay

## 2024-11-13 LAB — COMPREHENSIVE METABOLIC PANEL WITH GFR
ALT: 19 IU/L (ref 0–32)
AST: 16 IU/L (ref 0–40)
Albumin: 3.8 g/dL — ABNORMAL LOW (ref 4.0–5.0)
Alkaline Phosphatase: 63 IU/L (ref 41–116)
BUN/Creatinine Ratio: 21 (ref 9–23)
BUN: 11 mg/dL (ref 6–20)
Bilirubin Total: 0.2 mg/dL (ref 0.0–1.2)
CO2: 22 mmol/L (ref 20–29)
Calcium: 8.9 mg/dL (ref 8.7–10.2)
Chloride: 102 mmol/L (ref 96–106)
Creatinine, Ser: 0.52 mg/dL — ABNORMAL LOW (ref 0.57–1.00)
Globulin, Total: 2.4 g/dL (ref 1.5–4.5)
Glucose: 90 mg/dL (ref 70–99)
Potassium: 4.1 mmol/L (ref 3.5–5.2)
Sodium: 138 mmol/L (ref 134–144)
Total Protein: 6.2 g/dL (ref 6.0–8.5)
eGFR: 133 mL/min/1.73

## 2024-11-13 LAB — CBC WITH DIFFERENTIAL/PLATELET
Basophils Absolute: 0 x10E3/uL (ref 0.0–0.2)
Basos: 0 %
EOS (ABSOLUTE): 0.1 x10E3/uL (ref 0.0–0.4)
Eos: 2 %
Hematocrit: 38 % (ref 34.0–46.6)
Hemoglobin: 12.1 g/dL (ref 11.1–15.9)
Immature Grans (Abs): 0 x10E3/uL (ref 0.0–0.1)
Immature Granulocytes: 0 %
Lymphocytes Absolute: 3.7 x10E3/uL — ABNORMAL HIGH (ref 0.7–3.1)
Lymphs: 44 %
MCH: 25.5 pg — ABNORMAL LOW (ref 26.6–33.0)
MCHC: 31.8 g/dL (ref 31.5–35.7)
MCV: 80 fL (ref 79–97)
Monocytes Absolute: 0.5 x10E3/uL (ref 0.1–0.9)
Monocytes: 6 %
Neutrophils Absolute: 4.1 x10E3/uL (ref 1.4–7.0)
Neutrophils: 48 %
Platelets: 279 x10E3/uL (ref 150–450)
RBC: 4.74 x10E6/uL (ref 3.77–5.28)
RDW: 12.7 % (ref 11.7–15.4)
WBC: 8.5 x10E3/uL (ref 3.4–10.8)

## 2024-11-13 LAB — THYROID PANEL WITH TSH
Free Thyroxine Index: 1.9 (ref 1.2–4.9)
T3 Uptake Ratio: 25 % (ref 24–39)
T4, Total: 7.4 ug/dL (ref 4.5–12.0)
TSH: 0.889 u[IU]/mL (ref 0.450–4.500)

## 2024-11-13 NOTE — Telephone Encounter (Signed)
 Called patient and inform the patient about her lab result. Patient states she still feel fatigue but able to sleep and eat as normal. Her lab results have no significant change with vital signs WNL I think her weakness probably from her recovering from the cold. Her flu, Strep, and COVID testing are remain negative so there is no reason from PCP standpoint to postpone her tonsillectomy on Friday. I recommended the pt to to continue monitoring her fatigue and if it become worsen or not better by the end of next week to make another visit or reach out back to us .    Lauren Samuels, DO  PGY 1 Family Medicine Resident

## 2024-11-20 ENCOUNTER — Ambulatory Visit: Payer: MEDICAID | Admitting: Orthopedic Surgery

## 2024-11-23 ENCOUNTER — Ambulatory Visit: Payer: Self-pay | Admitting: Family Medicine

## 2024-11-23 ENCOUNTER — Ambulatory Visit (INDEPENDENT_AMBULATORY_CARE_PROVIDER_SITE_OTHER): Payer: MEDICAID | Admitting: Neurology

## 2024-11-23 DIAGNOSIS — G4733 Obstructive sleep apnea (adult) (pediatric): Secondary | ICD-10-CM

## 2024-11-23 DIAGNOSIS — G4719 Other hypersomnia: Secondary | ICD-10-CM

## 2024-11-23 DIAGNOSIS — G43109 Migraine with aura, not intractable, without status migrainosus: Secondary | ICD-10-CM

## 2024-12-05 NOTE — Progress Notes (Signed)
 "      [facility]   HOME SLEEP TEST REPORT ( by Elene  mail -out device )    Study Protocol:     The SANSA chest-worn sensor is an FDA cleared and DOT approved type 4 home sleep test device - it measures eight physiological channels,  including blood oxygen saturation (measured via PPG [photoplethysmography]), EKG-derived heart rate, respiratory effort, chest movement (measured via accelerometer), snoring, body position, and actigraphy.  STUDY DATE:  11-19-2024 Data received :  12-05-2024    ORDERING CLINICIAN:  Dedra Gores, MD  REFERRING CLINICIAN:  Areta Saliva, MD    CLINICAL INFORMATION/HISTORY: Lauren Obrien is a 24 y.o. female CPAP user and OSA patient who iwas seen by VIDEO,  visit  03/28/2024.  She had issues with supplies and was not always able to use CPAP and she felt much more fatigued.  CPAP dependent . She noted how much it helps her to stay alert.   She has reached a reduction in AHI to 0.1/h. on CPAP. BMI : 50 , class 3 -she is not likely to see a decrease in AHI , that would  require a weight loss of 20% to 33%.    Chief concern according to patient :  how long will I need to use the CPAP ? Due to it being unsanitary for her to continue use of the machine her DME  requested for us  to send an order for a new machine and supplies for the patient to continue care. She was not able to get new supplies when the order was last sent due to losing her insurance.    We discussed the main risk factor is her weight. Class 3 obesity, PTSD, TMJ, Migraine with aura.  Seen in 2021 for chronic fatigue and dx with OSA , on CPAP. obstructive sleep apnea at a mild degree at present strong REM sleep dependence on 01-09-2020 CPAP titration followed on 5-10 21 she was fitted with a ResMed nasal cushion mask and small size. She did well between 5 and 9 cm water pressure and was given an auto titration machine with heated humidity with a setting from 5-12 with 1 cm EPR. She  remained on the same nasal cushion mask.     Epworth sleepiness score:   8/ 24   FSS at 46/ 63 points.    Easily asleep when not stimulated .  BMI: 50 kg/m  Neck Circumference: X    Sleep Summary:   Total Recording Time (hours, min):   9 h 20 minutes, beginning on 11-19-24 at 21;02 hours.      Total Sleep Time (hours, min):  5 h 56 minutes    Sleep efficiency %; 64%  REM sleep captured ?:                                        Respiratory Indices ( AHI )  by AASM  criteria of scoring;    Calculated pAHI (per hour):    5.4/h                                             Positional  respiratory activity  / snoring : extremely fragmented sleep architecture , likely due to needing CPAP to sleep  Oxygen Saturation during Sleep:   Oxygen Saturation (%) Mean at  95.7%  with an  O2 Saturation Range (%) between 86 and 99.9  %                                        O2 Saturation time (minutes) <89%:  < 1 minute         Pulse Rate during Sleep :   Pulse Mean 66 (bpm) in  regular rhythm,  Pulse Range between 52  bpm and 99 bpm.,                 IMPRESSION:  This HST did confirm mild, very mild sleep apnea to be present  but this outcome is influenced by the significant sleep fragmentation.  RECOMMENDATION: New CPAP will be ordered, based on patients risk of hypoventilation and inability to sleep without CPAP .    Any Patient endorsing a high level of sleepiness should be cautioned not to drive, work at heights, or operate dangerous machinery or heavy equipment when tired or sleepy.  Review of good sleep hygiene measures took place in the initial consultation but should be revisited ( Your guide to better sleep  a publication by the NIH is a good source of information).   The referring provider will be notified of the test results.    I certify that I have reviewed the raw data recording prior to the issuance of this report in accordance with the standards of the American Academy  of Sleep Medicine (AASM).    INTERPRETING PHYSICIAN:    Dedra Gores, MD  [facility] Sleep Board certified by The [employer] and Diplomate of the [employer]. Board certified In Neurology through the ABPN, Fellow of the [employer].         "

## 2024-12-06 ENCOUNTER — Ambulatory Visit: Payer: MEDICAID | Admitting: Family Medicine

## 2024-12-06 VITALS — BP 123/80 | HR 90 | Wt 298.4 lb

## 2024-12-06 DIAGNOSIS — L600 Ingrowing nail: Secondary | ICD-10-CM | POA: Diagnosis not present

## 2024-12-06 DIAGNOSIS — L608 Other nail disorders: Secondary | ICD-10-CM

## 2024-12-06 MED ORDER — CIPROFLOXACIN HCL 500 MG PO TABS: 500.0000 mg | ORAL_TABLET | Freq: Two times a day (BID) | ORAL | 0 refills | Status: DC

## 2024-12-06 MED ORDER — CIPROFLOXACIN HCL 0.3 % OP SOLN: OPHTHALMIC | 0 refills | Status: AC

## 2024-12-06 NOTE — Patient Instructions (Addendum)
 Thank you for coming in today! 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 We will treat with topical antibiotics for 4 weeks. Please come in to see us  in about 2 weeks for reassessment.   Please call the clinic at [phone] if your symptoms worsen or you have any concerns.  Best, Dr Adele

## 2024-12-06 NOTE — Assessment & Plan Note (Signed)
 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

## 2024-12-06 NOTE — Progress Notes (Signed)
" ° ° °  SUBJECTIVE:   CHIEF COMPLAINT / HPI:   Nail concern --L thumbnail painful when it grows longer or when something hits it --ongoing for the last few years, previously was having more constant pain --thinks she got a nail biopsy in the past --fungal blood culture 05/08/24 negative --was prescribed ABX (cipro ), antifungal (terbinafine ), and topical agent in the past but did not take these due to prolonged duration --also reports history of ingrown toenails (big toes BL), has had them trimmed in clinic before. Denies current toenail pain. States she has trimmed the edges of her big toes recently and is concerned about the appearance  PERTINENT  PMH / PSH: HS, OSA, obesity, reflux  OBJECTIVE:   BP 123/80   Pulse 90   Wt 298 lb 6.4 oz (135.4 kg)   LMP 11/04/2024 (Exact Date)   SpO2 100%   BMI 48.16 kg/m   General: Awake and conversant, no acute distress Pulm: normal work of breathing on room air Skin: L thumb nail with mild green-brown discoloration at distal end. Toenails looks good with very minimal ingrown nail of her big toes medially. 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

## 2024-12-11 ENCOUNTER — Ambulatory Visit: Payer: Self-pay | Admitting: Neurology

## 2024-12-11 DIAGNOSIS — G4719 Other hypersomnia: Secondary | ICD-10-CM

## 2024-12-11 DIAGNOSIS — Z9989 Dependence on other enabling machines and devices: Secondary | ICD-10-CM | POA: Insufficient documentation

## 2024-12-11 DIAGNOSIS — G43109 Migraine with aura, not intractable, without status migrainosus: Secondary | ICD-10-CM

## 2024-12-11 DIAGNOSIS — G4733 Obstructive sleep apnea (adult) (pediatric): Secondary | ICD-10-CM

## 2024-12-11 NOTE — Procedures (Signed)
 "      [facility]   HOME SLEEP TEST REPORT ( by Elene  mail -out device )    Study Protocol:     The SANSA chest-worn sensor is an FDA cleared and DOT approved type 4 home sleep test device - it measures eight physiological channels,  including blood oxygen saturation (measured via PPG [photoplethysmography]), EKG-derived heart rate, respiratory effort, chest movement (measured via accelerometer), snoring, body position, and actigraphy.  STUDY DATE:  11-19-2024 Data received :  12-05-2024    ORDERING CLINICIAN:  Dedra Gores, MD  REFERRING CLINICIAN:  Areta Saliva, MD    CLINICAL INFORMATION/HISTORY: Lauren Obrien is a 24 y.o. female CPAP user and OSA patient who iwas seen by VIDEO,  visit  03/28/2024.  She had issues with supplies and was not always able to use CPAP and she felt much more fatigued.  CPAP dependent . She noted how much it helps her to stay alert.   She has reached a reduction in AHI to 0.1/h. on CPAP. BMI : 50 , class 3 -she is not likely to see a decrease in AHI , that would  require a weight loss of 20% to 33%.    Chief concern according to patient :  how long will I need to use the CPAP ? Due to it being unsanitary for her to continue use of the machine her DME  requested for us  to send an order for a new machine and supplies for the patient to continue care. She was not able to get new supplies when the order was last sent due to losing her insurance.    We discussed the main risk factor is her weight. Class 3 obesity, PTSD, TMJ, Migraine with aura.  Seen in 2021 for chronic fatigue and dx with OSA , on CPAP. obstructive sleep apnea at a mild degree at present strong REM sleep dependence on 01-09-2020 CPAP titration followed on 5-10 21 she was fitted with a ResMed nasal cushion mask and small size. She did well between 5 and 9 cm water pressure and was given an auto titration machine with heated humidity with a setting from 5-12 with 1 cm EPR. She  remained on the same nasal cushion mask.     Epworth sleepiness score:   8/ 24   FSS at 46/ 63 points.    Easily asleep when not stimulated .  BMI: 50 kg/m  Neck Circumference: X    Sleep Summary:   Total Recording Time (hours, min):   9 h 20 minutes, beginning on 11-19-24 at 21;02 hours.      Total Sleep Time (hours, min):  5 h 56 minutes    Sleep efficiency %; 64%  REM sleep captured ?:                                        Respiratory Indices ( AHI )  by AASM  criteria of scoring;    Calculated pAHI (per hour):    5.4/h                                             Positional  respiratory activity  / snoring : extremely fragmented sleep architecture , likely due to needing CPAP to sleep  Oxygen Saturation during Sleep:   Oxygen Saturation (%) Mean at  95.7%  with an  O2 Saturation Range (%) between 86 and 99.9  %                                        O2 Saturation time (minutes) <89%:  < 1 minute         Pulse Rate during Sleep :   Pulse Mean 66 (bpm) in  regular rhythm,  Pulse Range between 52  bpm and 99 bpm.,                 IMPRESSION:  This HST did confirm mild, very mild sleep apnea to be present  but this outcome is influenced by the significant sleep fragmentation.  RECOMMENDATION: New CPAP will be ordered, based on patients risk of hypoventilation and inability to sleep without CPAP .    Any Patient endorsing a high level of sleepiness should be cautioned not to drive, work at heights, or operate dangerous machinery or heavy equipment when tired or sleepy.  Review of good sleep hygiene measures took place in the initial consultation but should be revisited ( Your guide to better sleep  a publication by the NIH is a good source of information).   The referring provider will be notified of the test results.    I certify that I have reviewed the raw data recording prior to the issuance of this report in accordance with the standards of the American Academy  of Sleep Medicine (AASM).    INTERPRETING PHYSICIAN:    Dedra Gores, MD  [facility] Sleep Board certified by The [employer] of Sleep Medicine and Diplomate of the [employer]. Board certified In Neurology through the ABPN, Fellow of the [employer].        "

## 2024-12-12 NOTE — Telephone Encounter (Signed)
 Please try to get a new CPAP through for this patient. CD

## 2024-12-13 NOTE — Progress Notes (Signed)
 Location Information: Patient State (at time of visit): [place]  Patient Location (at time of visit):Home/Other Non-Medical  Provider Location: Non-Provider-Based Clinic (Clinic, non-hospital) Is provider licensed to provide clinical care in the current location/state of the patient? Yes   Consent:  Patient's identity was confirmed. Presenting condition or illness was discussed with the patient/personal representative. Current proposed treatment for presenting condition or illness was explained to patient/personal representative along with the likely benefits and any significant risks or complications associated with the provision of treatment by audio/video means. The patient/personal representative verbally authorized treatment to be provided by audio/video, which may include a limited review of patient's current health status, medication, or other treatment recommendations, patient education, and an opportunity to ask questions about condition and treatment. Verbal Consent Granted by Patient/Personal Representative:Yes   Visit Information: Modality: Audio-Only  Time Spent on Phone w/ Patient: 6 min  History of Present Illness The patient is a 24 year old female who presents via virtual visit for a postoperative follow-up after a tonsillectomy.  She reports significant improvement in her condition, although she experienced severe pain initially. Currently, she notes throat pain during singing and occasional morning throat discomfort. She also experiences random episodes of throat pain throughout the day. In her fourth week post-surgery, she has noticed a decrease in pain intensity during yawning. Additionally, she reports pain when stretching her tongue out of her teeth. She has not had any instances of tonsil stones. Certain foods, such as oranges, induce throat pain, and she has experienced choking on food, which was not present prior to the surgery.  PAST SURGICAL  HISTORY: Tonsillectomy - 11/2024  Physical Exam   Assessment & Plan 1. Postoperative status following tonsillectomy. Currently in the recovery phase, which is expected to last approximately 2 to 4 weeks longer. Discomfort during yawning or stretching backwards is common. Throat sensitivity, particularly in the tonsil area, can be exacerbated by certain foods such as spicy, hot, cold, or sweet items. This sensitivity is anticipated to subside over time.  If issues persist beyond another month, further evaluation is advised.  Results

## 2024-12-17 ENCOUNTER — Ambulatory Visit: Payer: MEDICAID | Admitting: Orthopedic Surgery

## 2024-12-19 ENCOUNTER — Ambulatory Visit: Payer: MEDICAID | Admitting: Orthopedic Surgery

## 2024-12-19 DIAGNOSIS — G5602 Carpal tunnel syndrome, left upper limb: Secondary | ICD-10-CM | POA: Diagnosis not present

## 2024-12-19 DIAGNOSIS — M654 Radial styloid tenosynovitis [de Quervain]: Secondary | ICD-10-CM

## 2024-12-19 MED ORDER — LIDOCAINE HCL 1 % IJ SOLN
1.0000 mL | INTRAMUSCULAR | Status: AC | PRN
  Administered 2024-12-19: 1 mL

## 2024-12-19 MED ORDER — BETAMETHASONE SOD PHOS & ACET 6 (3-3) MG/ML IJ SUSP
6.0000 mg | INTRAMUSCULAR | Status: AC | PRN
  Administered 2024-12-19: 6 mg via INTRA_ARTICULAR

## 2024-12-20 ENCOUNTER — Ambulatory Visit: Payer: MEDICAID

## 2024-12-20 ENCOUNTER — Telehealth: Payer: Self-pay | Admitting: Orthopedic Surgery

## 2024-12-20 VITALS — Wt 298.0 lb

## 2024-12-20 DIAGNOSIS — G5602 Carpal tunnel syndrome, left upper limb: Secondary | ICD-10-CM

## 2024-12-20 DIAGNOSIS — M654 Radial styloid tenosynovitis [de Quervain]: Secondary | ICD-10-CM

## 2024-12-20 NOTE — Telephone Encounter (Signed)
 Pt called wanting to know if it was normal to have pain the next day at the injection site.She also says that she can't use her hand because of the pain. Call back number is [phone].

## 2024-12-21 NOTE — Progress Notes (Cosign Needed)
 Lauren Obrien   PCP: Nicholas Bar, MD  CHIEF COMPLAINT: Bilateral wrist pain and numbness and tingling of left hand  HPI: Patient is a pleasant 24 y.o. female who presents today for fitting of wrist braces.  Patient saw orthopedic hand surgeon Dr. Erwin yesterday for bilateral de Quervain's injections and further management of left-sided carpal tunnel syndrome.  Patient was offered de Quervain's wrist braces but supposedly the wrist braces that were offered at the previous clinic would not be covered by her insurance.  She is here today based on insurance requirements for further wrist brace fitting.  Wondering if she can find wrist braces that treat both carpal tunnel and de Quervain's tenosynovitis.  She also says she is having some persistent left first compartment wrist pain after injection yesterday.  PMH:  Past Medical History:  Diagnosis Date   Acne vulgaris    Cough 11/17/2021   COVID-19 12/11/2020   Dizziness 08/23/2019   Folliculitis 08/29/2019   GERD (gastroesophageal reflux disease)    Helicobacter pylori gastritis    Hidradenitis suppurativa    Neuropathy    BUE   Pharyngitis 11/13/2019   Pre-diabetes    Routine screening for STI (sexually transmitted infection) 12/26/2020   Seasonal allergies    Sleep apnea    Sleep deprivation 09/28/2018   Snoring 01/22/2020   Sore throat 11/13/2019   Strep pharyngitis 05/29/2022   Viral URI 02/01/2020    Patient Active Problem List   Diagnosis Date Noted   CPAP (continuous positive airway pressure) dependence 12/11/2024   Generalized headaches 02/24/2024   Gastrointestinal complaints 11/28/2023   Polydipsia 10/11/2023   Chronic midline low back pain without sciatica 06/10/2023   Nail discoloration 03/28/2023   Umbilical discharge 05/04/2022   Paresthesias 03/20/2022   Right wrist tendinitis 06/06/2020   Morbid obesity (HCC) 01/22/2020   Chronic  fatigue 01/22/2020   Excessive daytime sleepiness 01/22/2020   OSA (obstructive sleep apnea) 01/22/2020   Oropharyngeal dysphagia 01/11/2020   Prediabetes 08/29/2019   Dizziness 08/23/2019   Memory difficulties 07/01/2019   Chronic tension type headache 05/29/2019   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  Meds:  Previous Medications   CIPROFLOXACIN  (CILOXAN ) 0.3 % OPHTHALMIC SOLUTION    1-2 drops twice daily on your left thumb nailbed   IBUPROFEN  (ADVIL ) 600 MG TABLET    Take 1 tablet (600 mg total) by mouth every 8 (eight) hours as needed.    Social:  Social History   Tobacco Use   Smoking status: Never    Passive exposure: Never   Smokeless tobacco: Never  Substance Use Topics   Alcohol use: No    Alcohol/week: 0.0 standard drinks of alcohol    REVIEW OF SYSTEMS:  ROS negative except as noted in HPI above   Objective Exam:  Vitals:   12/20/24 1156  Weight: 298 lb (135.2 kg)  GENERAL: Patient is afebrile, Vital signs reviewed, well appearing, Patient appears comfortable, Alert and lucid. No apparent distress.   Physical Exam   Ortho Exam:  On inspection of bilateral hands no evidence of erythema, ecchymosis, edema or signs of trauma.  No tenderness to palpation over bony landmarks or joints.  Patient has full active and passive range of motion of wrist and hands  bilaterally.  Strength 5/5 in bilateral wrist and hands.  Neurovascularly intact distally.  Mildly positive Tinel's at left median nerve.  Positive Finkelstein's bilaterally.  RESULTS:  Labs: No results found for this or any previous visit (from the past 48 hours).  Imaging:  No orders to display    Assessment/Plan:  1. De Quervain's tenosynovitis, bilateral   2. Carpal tunnel syndrome of left wrist   - Provided reassurance that mild persistent pain of left wrist first dorsal compartment will likely resolve and may be acute steroid flare after injection.  No evidence of swelling or infection on exam. - Fit patient for bilateral wrist braces that included cock up wrist component for carpal tunnel as well as thumb spica component for de Quervain's. - Can continue applying ice, taking ibuprofen  or Tylenol  for residual pain - Follow-up with Ortho care for repeat EMG of left median nerve as planned yesterday - Follow-up with Dr. Erwin in 6 weeks for reevaluation  New Prescriptions   No medications on file    Medications, medical history, allergies, surgical history, hospitalizations, family history, social history, ROS and vitals entered by nursing staff and reviewed by myself.  I discussed with the patient the diagnosis, treatment plan, indications for return to the emergency department, and for expected follow-up. The patient verbalized an understanding. The patient is asked if there are any questions or concerns. We discuss the case, until all issues are addressed to the patient's satisfaction.  Follow up per instructions including returning for additional office visit if symptoms worsen or proceeding to the emergency department or urgent care in the next 12-24hrs if there is an acute concerning increasing symptoms, pain, fevers, or other symptoms.  Prentice Agent, DO  4:57 PM, 12/21/2024

## 2024-12-25 ENCOUNTER — Ambulatory Visit: Payer: MEDICAID | Admitting: Podiatry

## 2024-12-27 ENCOUNTER — Ambulatory Visit: Payer: MEDICAID

## 2025-01-11 ENCOUNTER — Encounter: Payer: MEDICAID | Admitting: Physical Medicine and Rehabilitation

## 2025-03-11 ENCOUNTER — Ambulatory Visit: Admitting: Neurology
